# Patient Record
Sex: Male | Born: 1955 | Race: White | Hispanic: No | Marital: Married | Smoking: Former smoker
Health system: Southern US, Community
[De-identification: ages and names within clinical notes are randomized; demographics above are authoritative.]

## PROBLEM LIST (undated history)

## (undated) ENCOUNTER — Encounter

## (undated) ENCOUNTER — Ambulatory Visit

## (undated) ENCOUNTER — Ambulatory Visit: Attending: Pharmacist | Primary: Pharmacist

## (undated) DIAGNOSIS — Z9989 Dependence on other enabling machines and devices: Secondary | ICD-10-CM

## (undated) DIAGNOSIS — N529 Male erectile dysfunction, unspecified: Secondary | ICD-10-CM

## (undated) DIAGNOSIS — L719 Rosacea, unspecified: Secondary | ICD-10-CM

## (undated) DIAGNOSIS — G4733 Obstructive sleep apnea (adult) (pediatric): Secondary | ICD-10-CM

## (undated) DIAGNOSIS — I10 Essential (primary) hypertension: Secondary | ICD-10-CM

## (undated) DIAGNOSIS — C61 Malignant neoplasm of prostate: Secondary | ICD-10-CM

## (undated) DIAGNOSIS — C84A0 Cutaneous T-cell lymphoma, unspecified, unspecified site: Secondary | ICD-10-CM

## (undated) DIAGNOSIS — G4730 Sleep apnea, unspecified: Secondary | ICD-10-CM

## (undated) HISTORY — PX: URETERAL STENT PLACEMENT: SHX822

## (undated) HISTORY — PX: TONSILLECTOMY: SUR1361

## (undated) HISTORY — PX: OTHER SURGICAL HISTORY: SHX169

## (undated) HISTORY — PX: CHOLECYSTECTOMY: SHX55

## (undated) HISTORY — PX: PROSTATE SURGERY: SHX751

---

## 1988-10-08 HISTORY — PX: OTHER SURGICAL HISTORY: SHX169

## 2002-01-30 ENCOUNTER — Encounter: Payer: Self-pay | Admitting: Family Medicine

## 2002-01-30 ENCOUNTER — Encounter: Admission: RE | Admit: 2002-01-30 | Discharge: 2002-01-30 | Payer: Self-pay | Admitting: Family Medicine

## 2003-08-05 ENCOUNTER — Encounter: Admission: RE | Admit: 2003-08-05 | Discharge: 2003-08-05 | Payer: Self-pay | Admitting: Family Medicine

## 2009-07-28 ENCOUNTER — Ambulatory Visit (HOSPITAL_BASED_OUTPATIENT_CLINIC_OR_DEPARTMENT_OTHER): Admission: RE | Admit: 2009-07-28 | Discharge: 2009-07-28 | Payer: Self-pay | Admitting: Orthopedic Surgery

## 2011-01-11 LAB — POCT I-STAT, CHEM 8
BUN: 14 mg/dL (ref 6–23)
Calcium, Ion: 1.12 mmol/L (ref 1.12–1.32)
Chloride: 105 mEq/L (ref 96–112)
Creatinine, Ser: 1 mg/dL (ref 0.4–1.5)
Glucose, Bld: 110 mg/dL — ABNORMAL HIGH (ref 70–99)
HCT: 48 % (ref 39.0–52.0)
Hemoglobin: 16.3 g/dL (ref 13.0–17.0)
Potassium: 3.7 mEq/L (ref 3.5–5.1)
Sodium: 139 mEq/L (ref 135–145)
TCO2: 23 mmol/L (ref 0–100)

## 2013-11-26 ENCOUNTER — Ambulatory Visit
Admission: RE | Admit: 2013-11-26 | Discharge: 2013-11-26 | Disposition: A | Discharge status: Home / Self Care | Payer: BC Managed Care – PPO | Source: Ambulatory Visit | Attending: Internal Medicine | Admitting: Internal Medicine

## 2013-11-26 ENCOUNTER — Other Ambulatory Visit: Payer: Self-pay | Admitting: Internal Medicine

## 2013-11-26 DIAGNOSIS — R059 Cough, unspecified: Secondary | ICD-10-CM

## 2013-11-26 DIAGNOSIS — R05 Cough: Secondary | ICD-10-CM

## 2014-08-12 ENCOUNTER — Other Ambulatory Visit (HOSPITAL_COMMUNITY): Payer: Self-pay | Admitting: Dermatology

## 2014-08-13 ENCOUNTER — Other Ambulatory Visit (HOSPITAL_COMMUNITY): Payer: Self-pay | Admitting: Dermatology

## 2014-08-16 ENCOUNTER — Other Ambulatory Visit (HOSPITAL_COMMUNITY): Payer: Self-pay | Admitting: Dermatology

## 2014-08-16 DIAGNOSIS — L986 Other infiltrative disorders of the skin and subcutaneous tissue: Principal | ICD-10-CM

## 2014-08-16 DIAGNOSIS — L309 Dermatitis, unspecified: Secondary | ICD-10-CM

## 2014-08-16 DIAGNOSIS — C9110 Chronic lymphocytic leukemia of B-cell type not having achieved remission: Secondary | ICD-10-CM

## 2014-08-16 DIAGNOSIS — C8400 Mycosis fungoides, unspecified site: Secondary | ICD-10-CM

## 2014-08-18 ENCOUNTER — Ambulatory Visit (HOSPITAL_COMMUNITY)
Admission: RE | Admit: 2014-08-18 | Discharge: 2014-08-18 | Disposition: A | Discharge status: Home / Self Care | Payer: BC Managed Care – PPO | Source: Ambulatory Visit | Attending: Dermatology | Admitting: Dermatology

## 2014-08-18 DIAGNOSIS — C8400 Mycosis fungoides, unspecified site: Secondary | ICD-10-CM

## 2014-08-18 MED ORDER — IOHEXOL 300 MG/ML  SOLN
80.0000 mL | Freq: Once | INTRAMUSCULAR | Status: AC | PRN
  Administered 2014-08-18: 80 mL via INTRAVENOUS

## 2014-08-19 DIAGNOSIS — C8400 Mycosis fungoides, unspecified site: Secondary | ICD-10-CM | POA: Insufficient documentation

## 2015-06-27 DIAGNOSIS — I10 Essential (primary) hypertension: Secondary | ICD-10-CM | POA: Insufficient documentation

## 2015-10-09 DIAGNOSIS — C61 Malignant neoplasm of prostate: Secondary | ICD-10-CM

## 2015-10-09 HISTORY — PX: PROSTATE SURGERY: SHX751

## 2015-10-09 HISTORY — DX: Malignant neoplasm of prostate: C61

## 2016-01-09 DIAGNOSIS — H33302 Unspecified retinal break, left eye: Secondary | ICD-10-CM | POA: Diagnosis not present

## 2016-01-09 DIAGNOSIS — H524 Presbyopia: Secondary | ICD-10-CM | POA: Diagnosis not present

## 2016-01-19 DIAGNOSIS — N5201 Erectile dysfunction due to arterial insufficiency: Secondary | ICD-10-CM | POA: Diagnosis not present

## 2016-01-19 DIAGNOSIS — Z0000 Encounter for general adult medical examination without abnormal findings: Secondary | ICD-10-CM | POA: Diagnosis not present

## 2016-01-19 DIAGNOSIS — R972 Elevated prostate specific antigen [PSA]: Secondary | ICD-10-CM | POA: Diagnosis not present

## 2016-02-13 DIAGNOSIS — C8400 Mycosis fungoides, unspecified site: Secondary | ICD-10-CM | POA: Diagnosis not present

## 2016-02-13 DIAGNOSIS — Z79899 Other long term (current) drug therapy: Secondary | ICD-10-CM | POA: Diagnosis not present

## 2016-03-06 DIAGNOSIS — D075 Carcinoma in situ of prostate: Secondary | ICD-10-CM | POA: Diagnosis not present

## 2016-03-06 DIAGNOSIS — R972 Elevated prostate specific antigen [PSA]: Secondary | ICD-10-CM | POA: Diagnosis not present

## 2016-03-06 DIAGNOSIS — Z0000 Encounter for general adult medical examination without abnormal findings: Secondary | ICD-10-CM | POA: Diagnosis not present

## 2016-03-19 ENCOUNTER — Other Ambulatory Visit: Payer: Self-pay | Admitting: Urology

## 2016-03-19 DIAGNOSIS — C61 Malignant neoplasm of prostate: Secondary | ICD-10-CM

## 2016-03-23 ENCOUNTER — Ambulatory Visit (HOSPITAL_COMMUNITY)
Admission: RE | Admit: 2016-03-23 | Discharge: 2016-03-23 | Disposition: A | Discharge status: Home / Self Care | Payer: BLUE CROSS/BLUE SHIELD | Source: Ambulatory Visit | Attending: Urology | Admitting: Urology

## 2016-03-23 ENCOUNTER — Encounter (HOSPITAL_COMMUNITY)
Admission: RE | Admit: 2016-03-23 | Discharge: 2016-03-23 | Disposition: A | Discharge status: Home / Self Care | Payer: BLUE CROSS/BLUE SHIELD | Source: Ambulatory Visit | Attending: Urology | Admitting: Urology

## 2016-03-23 DIAGNOSIS — C61 Malignant neoplasm of prostate: Secondary | ICD-10-CM | POA: Diagnosis not present

## 2016-03-23 DIAGNOSIS — R972 Elevated prostate specific antigen [PSA]: Secondary | ICD-10-CM | POA: Diagnosis not present

## 2016-03-23 MED ORDER — TECHNETIUM TC 99M MEDRONATE IV KIT
24.1000 | PACK | Freq: Once | INTRAVENOUS | Status: AC | PRN
  Administered 2016-03-23: 24.1 via INTRAVENOUS

## 2016-03-29 DIAGNOSIS — C61 Malignant neoplasm of prostate: Secondary | ICD-10-CM | POA: Diagnosis not present

## 2016-03-29 DIAGNOSIS — N5201 Erectile dysfunction due to arterial insufficiency: Secondary | ICD-10-CM | POA: Diagnosis not present

## 2016-04-04 DIAGNOSIS — Z79899 Other long term (current) drug therapy: Secondary | ICD-10-CM | POA: Diagnosis not present

## 2016-04-04 DIAGNOSIS — C8400 Mycosis fungoides, unspecified site: Secondary | ICD-10-CM | POA: Diagnosis not present

## 2016-04-16 DIAGNOSIS — C8400 Mycosis fungoides, unspecified site: Secondary | ICD-10-CM | POA: Diagnosis not present

## 2016-04-16 DIAGNOSIS — L652 Alopecia mucinosa: Secondary | ICD-10-CM | POA: Diagnosis not present

## 2016-04-16 DIAGNOSIS — Z79899 Other long term (current) drug therapy: Secondary | ICD-10-CM | POA: Diagnosis not present

## 2016-04-18 ENCOUNTER — Encounter: Payer: Self-pay | Admitting: Radiation Oncology

## 2016-04-18 NOTE — Progress Notes (Signed)
GU Location of Tumor / Histology: prostatic adenocarcinoma  If Prostate Cancer, Gleason Score is (4 + 5) and PSA is (4.59) on 3/17  Kevin [name] was referred by his PCP, Dr. Inda Merlin, to Dr. Phebe Colla for evaluation of an elevated PSA in May 2017  Biopsies of prostate (if applicable) revealed:    Past/Anticipated interventions by urology, if any: biopsy, referral to Dr. Tammi Klippel for second opinion, encouraging surgery or radiation followed by hormone therapy  Past/Anticipated interventions by medical oncology, if any: no  Weight changes, if any: no  Bowel/Bladder complaints, if [id number] hematuria, dysuria, leakage or incontinence. Reports frequency, urgency, and weak stream less than 1 in 5 times. Mild ED.   Nausea/Vomiting, if any: no  Pain issues, if any:  no  SAFETY ISSUES:  Prior radiation? 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  Pacemaker/ICD? no  Possible current pregnancy? no  Is the patient on methotrexate? no  Current Complaints / other details:  60 year old male. Married. Paternal uncle had prostate cancer. Father had oral cancer. Mother had skin cancer.

## 2016-04-19 ENCOUNTER — Encounter: Payer: Self-pay | Admitting: Radiation Oncology

## 2016-04-19 ENCOUNTER — Ambulatory Visit
Admission: RE | Admit: 2016-04-19 | Discharge: 2016-04-19 | Disposition: A | Discharge status: Home / Self Care | Payer: BLUE CROSS/BLUE SHIELD | Source: Ambulatory Visit | Attending: Radiation Oncology | Admitting: Radiation Oncology

## 2016-04-19 ENCOUNTER — Ambulatory Visit: Payer: BLUE CROSS/BLUE SHIELD | Admitting: Radiation Oncology

## 2016-04-19 ENCOUNTER — Encounter: Payer: Self-pay | Admitting: Medical Oncology

## 2016-04-19 VITALS — BP 141/98 | HR 66 | Ht 72.0 in | Wt 207.3 lb

## 2016-04-19 DIAGNOSIS — C61 Malignant neoplasm of prostate: Secondary | ICD-10-CM | POA: Insufficient documentation

## 2016-04-19 HISTORY — DX: Cutaneous T-cell lymphoma, unspecified, unspecified site: C84.A0

## 2016-04-19 HISTORY — DX: Malignant neoplasm of prostate: C61

## 2016-04-19 HISTORY — DX: Essential (primary) hypertension: I10

## 2016-04-19 NOTE — Progress Notes (Signed)
Radiation Oncology         [phone]) [phone] ________________________________  Initial outpatient Consultation  Name: Kevin [name] MRN: [id number]  Date: 04/19/2016  DOB: October 02, 1956  [id number] NEVILL, MD  Josetta Huddle, MD   REFERRING PHYSICIAN: Josetta Huddle, MD  DIAGNOSIS: The encounter diagnosis was Malignant neoplasm of prostate [facility]).    ICD-9-CM ICD-10-CM   1. Malignant neoplasm of prostate ([place]) 185 C61     HISTORY OF PRESENT ILLNESS: Kevin [name] is a pleasant 60 y.o. male with a newly noted prostate cancer seen at the request of Dr. Tresa Moore. The patient has been followed by his PCP Dr. Inda Merlin and his PSA rose from 3.6 in 2015 to 3.94 in 2016. In May 2017 PSA was 4.59. He subsequently was referred to Dr. Tresa Moore and on 03/06/16 he was seen, at that time DRE revealed no palpable nodules. A biopsy was then performed on 04/06/16 and his prostatic volume by ultrasound ws 32 cc. Final pathology revealed a Gleason 4+5 adenocarcinoma and several Gleason 6 and 7 cancer within the specimen with 6/12 cores being involved with cancer. He is also being cared for by Dr. Lyanne Co at [facility] for a cutaneous T cell lymphoma which was diagnosed in November 2015 and is on chronic interferon therapy. He has been counseled on his disease and the need for multimodality therapy. He comes to discuss the role of radiotherapy with Dr. Tammi Klippel.     PREVIOUS RADIATION THERAPY: No  PAST MEDICAL HISTORY:  Past Medical History  Diagnosis Date  . Prostate cancer ([place])   . 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 Hypertension       PAST SURGICAL HISTORY: Past Surgical History  Procedure Laterality Date  . Cholecystectomy    . Tonsillectomy    . Receiving chronic interferon       FAMILY HISTORY:  Family History  Problem Relation Age of Onset  . Hypertension Mother   . Skin cancer Mother   . Stroke Father   . Hypertension Father   . Head & neck cancer Father     . Prostate cancer Paternal Uncle     prostate ca   SOCIAL HISTORY:  Social History   Social History  . Marital Status: Married    Spouse Name: N/A  . Number of Children: N/A  . Years of Education: N/A   Occupational History  . Not on file.   Social History Main Topics  . Smoking status: Former Smoker -- 10 years    Types: Cigarettes    Quit date: 10/08/2014  . Smokeless tobacco: Never Used  . Alcohol Use: Yes     Comment: 2-5 per day  . Drug Use: No  . Sexual Activity: Yes   Other Topics Concern  . Not on file   Social History Narrative   Patient is a Customer service manager.  Both he and wife live in the [place] area. Two children.  One in [place], one in [place], [place].   ALLERGIES: Review of patient's allergies indicates no known allergies.  MEDICATIONS:  Current Outpatient Prescriptions  Medication Sig Dispense Refill  . interferon alfa-2b 20 Million Units/m2 in sodium chloride 0.9 % 100 mL     . milk thistle 175 MG tablet Take 175 mg by mouth daily.    . Red Yeast Rice Extract 600 MG CAPS Take by mouth.    . triamcinolone cream (KENALOG) 0.1 % Apply topically.     No  current facility-administered medications for this encounter.    REVIEW OF SYSTEMS:  On review of systems, the patient reports that he is doing well overall. He denies any chest pain, shortness of breath, cough, fevers, chills, night sweats, unintended weight changes. He denies any bowel disturbances, and denies abdominal pain, nausea or vomiting. He admits to frequency, urinary urgency, and a weak stream.  His IPSS score is 3, indicating mild urinary symptoms.  His erectile function is preserved and he is able to complete sexual activity on most attempts. He denies any new musculoskeletal or joint aches or pains. A complete review of systems is obtained and is otherwise negative. He uses Viagra and Cialis.    PHYSICAL EXAM:  height is 6' (1.829 m) and weight is 207 lb 4.8 oz (94.031 kg). His blood  pressure is 141/98 and his pulse is 66.   Pain Scale 0/10 In general this is a well appearing he in no acute distress. He is alert and oriented x4 and appropriate throughout the examination. HEENT reveals that the patient is normocephalic, atraumatic. EOMs are intact. PERRLA. Skin is intact without any evidence of gross lesions. Cardiovascular exam reveals a regular rate and rhythm, no clicks rubs or murmurs are auscultated. Chest is clear to auscultation bilaterally. Lymphatic assessment is performed and does not reveal any adenopathy in the cervical, supraclavicular, axillary, or inguinal chains. Abdomen has active bowel sounds in all quadrants and is intact. The abdomen is soft, non tender, non distended. Lower extremities are negative for pretibial pitting edema, deep calf tenderness, cyanosis or clubbing.    KPS = 100  100 - Normal; no complaints; no evidence of disease. 90   - Able to carry on normal activity; minor signs or symptoms of disease. 80   - Normal activity with effort; some signs or symptoms of disease. 47   - Cares for self; unable to carry on normal activity or to do active work. 60   - Requires occasional assistance, but is able to care for most of his personal needs. 50   - Requires considerable assistance and frequent medical care. 30   - Disabled; requires special care and assistance. 51   - Severely disabled; hospital admission is indicated although death not imminent. 61   - Very sick; hospital admission necessary; active supportive treatment necessary. 10   - Moribund; fatal processes progressing rapidly. 0     - Dead  Karnofsky DA, Abelmann [place], Craver LS and Burchenal [facility] [phone]) The use of the nitrogen mustards in the palliative treatment of carcinoma: with particular reference to bronchogenic carcinoma Cancer 1 634-56  LABORATORY DATA:  Lab Results  Component Value Date   HGB 16.3 07/28/2009   HCT 48.0 07/28/2009   Lab Results  Component Value Date   NA 139  07/28/2009   K 3.7 07/28/2009   CL 105 07/28/2009   No results found for: ALT, AST, GGT, ALKPHOS, BILITOT   RADIOGRAPHY: Nm Bone Scan Whole Body  03/23/2016  CLINICAL DATA:  History of prostate cancer. No bone pain. No history of recent or old fracture. Also history of T-cell lymphoma 2015. Elevated PSA. EXAM: NUCLEAR MEDICINE WHOLE BODY BONE SCAN TECHNIQUE: Whole body anterior and posterior images were obtained approximately 3 hours after intravenous injection of radiopharmaceutical. RADIOPHARMACEUTICALS:  24.1 mCi Technetium-60m MDP IV COMPARISON:  CT 08/18/2014 and 03/23/2016 FINDINGS: Examination demonstrates normal biodistribution of radiotracer uptake within the bones, soft tissues and genitourinary system. No focal radiotracer uptake to suggest osseous metastatic disease.  IMPRESSION: Normal bone scan. Electronically Signed   By: Marin Olp M.D.   On: 03/23/2016 14:24      IMPRESSION:  60 y.o. gentleman with a high risk, T1c adenocarcinoma of the prostate with a Gleason's score of 4+5 and PSA of 4.59.   PLAN: Dr. Tammi Klippel discusses the findings from the patient's biopsy and imaging studies. He discusses the patient's Gleason's Score puts his cancer into a high risk category and because of this he should receive treatment for his cancer. We discussed the options would include surgical intervention versus radiotherapy. He is going to be seeking outside second opinion with urology to contrast the surgical therapy he has been offered which includes radical prostatectomy with bilateral pelvic lymph node dissection. Dr. Tammi Klippel discusses the options of radiotherapy with the addition of 2 years of ADT. He outlines that the two options for radiotherapy would be for 5 weeks of external radiation followed by a radioactive seed implant as a boost. Alternatively, it would be reasonable to consider 8 weeks of external radiotherapy. He recommends with either approach, we would recommend ADT administration 2  months prior to initiating radiotherapy. The patient would like to consider his options and meet with the urologists at [facility]. He is going to be taking a trip later this October and would like to coordinate whichever treatment approach he chooses around this.     The above documentation reflects my direct findings during this shared patient visit. Please see the separate note by Dr. Tammi Klippel on this date for the remainder of the patient's plan of care.   Carola Rhine, PAC   This document serves as a record of services personally performed by Tyler Pita, MD. It was created on his behalf by Truddie Hidden, a trained medical scribe. The creation of this record is based on the scribe's personal observations and the provider's statements to them. This document has been checked and approved by the attending provider.

## 2016-04-19 NOTE — Progress Notes (Signed)
See progress note under physician encounter. 

## 2016-05-11 DIAGNOSIS — C61 Malignant neoplasm of prostate: Secondary | ICD-10-CM | POA: Diagnosis not present

## 2016-05-11 DIAGNOSIS — Z7189 Other specified counseling: Secondary | ICD-10-CM | POA: Diagnosis not present

## 2016-05-23 ENCOUNTER — Telehealth: Payer: Self-pay | Admitting: Radiation Oncology

## 2016-05-23 NOTE — Telephone Encounter (Addendum)
LM for pt to call me back about treatment options.

## 2016-05-31 DIAGNOSIS — C61 Malignant neoplasm of prostate: Secondary | ICD-10-CM | POA: Diagnosis not present

## 2016-06-26 ENCOUNTER — Telehealth: Payer: Self-pay | Admitting: Radiation Oncology

## 2016-06-26 NOTE — Telephone Encounter (Signed)
I spoke with the patient and he is having surgery in October at [facility] for his prostate cancer. I wished him luck and encouraged him to keep [place] informed if he had any questions in the future regarding radiation treatment.

## 2016-07-12 DIAGNOSIS — I10 Essential (primary) hypertension: Secondary | ICD-10-CM | POA: Diagnosis not present

## 2016-07-12 DIAGNOSIS — N5203 Combined arterial insufficiency and corporo-venous occlusive erectile dysfunction: Secondary | ICD-10-CM | POA: Diagnosis not present

## 2016-07-12 DIAGNOSIS — L652 Alopecia mucinosa: Secondary | ICD-10-CM | POA: Diagnosis not present

## 2016-07-12 DIAGNOSIS — C61 Malignant neoplasm of prostate: Secondary | ICD-10-CM | POA: Diagnosis not present

## 2016-07-18 DIAGNOSIS — C8409 Mycosis fungoides, extranodal and solid organ sites: Secondary | ICD-10-CM | POA: Diagnosis not present

## 2016-07-18 DIAGNOSIS — C61 Malignant neoplasm of prostate: Secondary | ICD-10-CM | POA: Diagnosis not present

## 2016-07-18 DIAGNOSIS — G8918 Other acute postprocedural pain: Secondary | ICD-10-CM | POA: Diagnosis not present

## 2016-07-18 DIAGNOSIS — N5202 Corporo-venous occlusive erectile dysfunction: Secondary | ICD-10-CM | POA: Diagnosis not present

## 2016-07-18 DIAGNOSIS — I10 Essential (primary) hypertension: Secondary | ICD-10-CM | POA: Diagnosis not present

## 2016-08-02 DIAGNOSIS — N393 Stress incontinence (female) (male): Secondary | ICD-10-CM | POA: Diagnosis not present

## 2016-08-02 DIAGNOSIS — N5231 Erectile dysfunction following radical prostatectomy: Secondary | ICD-10-CM | POA: Diagnosis not present

## 2016-08-02 DIAGNOSIS — C61 Malignant neoplasm of prostate: Secondary | ICD-10-CM | POA: Diagnosis not present

## 2016-09-10 DIAGNOSIS — C8400 Mycosis fungoides, unspecified site: Secondary | ICD-10-CM | POA: Diagnosis not present

## 2016-09-10 DIAGNOSIS — L652 Alopecia mucinosa: Secondary | ICD-10-CM | POA: Diagnosis not present

## 2016-09-10 DIAGNOSIS — Z79899 Other long term (current) drug therapy: Secondary | ICD-10-CM | POA: Diagnosis not present

## 2016-11-01 DIAGNOSIS — I10 Essential (primary) hypertension: Secondary | ICD-10-CM | POA: Diagnosis not present

## 2016-11-01 DIAGNOSIS — Z79899 Other long term (current) drug therapy: Secondary | ICD-10-CM | POA: Diagnosis not present

## 2016-11-01 DIAGNOSIS — C8400 Mycosis fungoides, unspecified site: Secondary | ICD-10-CM | POA: Diagnosis not present

## 2016-11-01 DIAGNOSIS — N5203 Combined arterial insufficiency and corporo-venous occlusive erectile dysfunction: Secondary | ICD-10-CM | POA: Diagnosis not present

## 2016-11-01 DIAGNOSIS — C61 Malignant neoplasm of prostate: Secondary | ICD-10-CM | POA: Diagnosis not present

## 2016-12-10 DIAGNOSIS — C8400 Mycosis fungoides, unspecified site: Secondary | ICD-10-CM | POA: Diagnosis not present

## 2016-12-10 DIAGNOSIS — L652 Alopecia mucinosa: Secondary | ICD-10-CM | POA: Diagnosis not present

## 2016-12-10 DIAGNOSIS — Z79899 Other long term (current) drug therapy: Secondary | ICD-10-CM | POA: Diagnosis not present

## 2017-01-11 DIAGNOSIS — Z0001 Encounter for general adult medical examination with abnormal findings: Secondary | ICD-10-CM | POA: Diagnosis not present

## 2017-01-16 DIAGNOSIS — R109 Unspecified abdominal pain: Secondary | ICD-10-CM | POA: Diagnosis not present

## 2017-01-16 DIAGNOSIS — Z8546 Personal history of malignant neoplasm of prostate: Secondary | ICD-10-CM | POA: Insufficient documentation

## 2017-01-16 DIAGNOSIS — C84A0 Cutaneous T-cell lymphoma, unspecified, unspecified site: Secondary | ICD-10-CM | POA: Insufficient documentation

## 2017-01-16 DIAGNOSIS — N23 Unspecified renal colic: Secondary | ICD-10-CM | POA: Diagnosis not present

## 2017-01-16 DIAGNOSIS — Z87891 Personal history of nicotine dependence: Secondary | ICD-10-CM | POA: Diagnosis not present

## 2017-01-16 DIAGNOSIS — N136 Pyonephrosis: Secondary | ICD-10-CM | POA: Diagnosis not present

## 2017-01-16 DIAGNOSIS — N132 Hydronephrosis with renal and ureteral calculous obstruction: Secondary | ICD-10-CM | POA: Diagnosis not present

## 2017-01-16 DIAGNOSIS — I10 Essential (primary) hypertension: Secondary | ICD-10-CM | POA: Insufficient documentation

## 2017-01-16 DIAGNOSIS — Z79899 Other long term (current) drug therapy: Secondary | ICD-10-CM | POA: Diagnosis not present

## 2017-01-17 ENCOUNTER — Observation Stay (HOSPITAL_BASED_OUTPATIENT_CLINIC_OR_DEPARTMENT_OTHER): Payer: BLUE CROSS/BLUE SHIELD | Admitting: Anesthesiology

## 2017-01-17 ENCOUNTER — Emergency Department (HOSPITAL_COMMUNITY): Payer: BLUE CROSS/BLUE SHIELD

## 2017-01-17 ENCOUNTER — Encounter (HOSPITAL_COMMUNITY): Payer: Self-pay | Admitting: Emergency Medicine

## 2017-01-17 ENCOUNTER — Observation Stay (HOSPITAL_COMMUNITY)
Admission: EM | Admit: 2017-01-17 | Discharge: 2017-01-17 | Disposition: A | Discharge status: Home / Self Care | Payer: BLUE CROSS/BLUE SHIELD | Attending: Urology | Admitting: Urology

## 2017-01-17 ENCOUNTER — Encounter (HOSPITAL_COMMUNITY)
Admission: EM | Disposition: A | Discharge status: Home / Self Care | Payer: Self-pay | Source: Home / Self Care | Attending: Emergency Medicine

## 2017-01-17 DIAGNOSIS — R109 Unspecified abdominal pain: Secondary | ICD-10-CM | POA: Diagnosis not present

## 2017-01-17 DIAGNOSIS — N201 Calculus of ureter: Secondary | ICD-10-CM | POA: Diagnosis not present

## 2017-01-17 DIAGNOSIS — N23 Unspecified renal colic: Secondary | ICD-10-CM

## 2017-01-17 DIAGNOSIS — I10 Essential (primary) hypertension: Secondary | ICD-10-CM | POA: Diagnosis not present

## 2017-01-17 DIAGNOSIS — N132 Hydronephrosis with renal and ureteral calculous obstruction: Secondary | ICD-10-CM

## 2017-01-17 DIAGNOSIS — Z466 Encounter for fitting and adjustment of urinary device: Secondary | ICD-10-CM | POA: Diagnosis not present

## 2017-01-17 DIAGNOSIS — R52 Pain, unspecified: Secondary | ICD-10-CM

## 2017-01-17 HISTORY — PX: CYSTOSCOPY WITH RETROGRADE PYELOGRAM, URETEROSCOPY AND STENT PLACEMENT: SHX5789

## 2017-01-17 LAB — URINALYSIS, ROUTINE W REFLEX MICROSCOPIC
Bilirubin Urine: NEGATIVE
Glucose, UA: NEGATIVE mg/dL
Ketones, ur: NEGATIVE mg/dL
Nitrite: NEGATIVE
Protein, ur: NEGATIVE mg/dL
Specific Gravity, Urine: 1.017 (ref 1.005–1.030)
Squamous Epithelial / LPF: NONE SEEN
pH: 6 (ref 5.0–8.0)

## 2017-01-17 LAB — CBC WITH DIFFERENTIAL/PLATELET
Basophils Absolute: 0 10*3/uL (ref 0.0–0.1)
Basophils Relative: 0 %
Eosinophils Absolute: 0.1 10*3/uL (ref 0.0–0.7)
Eosinophils Relative: 2 %
HCT: 41.4 % (ref 39.0–52.0)
Hemoglobin: 14.9 g/dL (ref 13.0–17.0)
Lymphocytes Relative: 21 %
Lymphs Abs: 1.2 10*3/uL (ref 0.7–4.0)
MCH: 31.8 pg (ref 26.0–34.0)
MCHC: 36 g/dL (ref 30.0–36.0)
MCV: 88.5 fL (ref 78.0–100.0)
Monocytes Absolute: 0.6 10*3/uL (ref 0.1–1.0)
Monocytes Relative: 9 %
Neutro Abs: 4 10*3/uL (ref 1.7–7.7)
Neutrophils Relative %: 68 %
Platelets: 179 10*3/uL (ref 150–400)
RBC: 4.68 MIL/uL (ref 4.22–5.81)
RDW: 13.4 % (ref 11.5–15.5)
WBC: 5.9 10*3/uL (ref 4.0–10.5)

## 2017-01-17 LAB — BASIC METABOLIC PANEL
Anion gap: 10 (ref 5–15)
BUN: 20 mg/dL (ref 6–20)
CO2: 21 mmol/L — ABNORMAL LOW (ref 22–32)
Calcium: 9.9 mg/dL (ref 8.9–10.3)
Chloride: 108 mmol/L (ref 101–111)
Creatinine, Ser: 1.39 mg/dL — ABNORMAL HIGH (ref 0.61–1.24)
GFR calc Af Amer: >60 mL/min (ref >60)
GFR calc non Af Amer: 54 mL/min — ABNORMAL LOW (ref >60)
Glucose, Bld: 176 mg/dL — ABNORMAL HIGH (ref 65–99)
Potassium: 3.8 mmol/L (ref 3.5–5.1)
Sodium: 139 mmol/L (ref 135–145)

## 2017-01-17 SURGERY — CYSTOURETEROSCOPY, WITH RETROGRADE PYELOGRAM AND STENT INSERTION
Anesthesia: General | Laterality: Left

## 2017-01-17 MED ORDER — KETOROLAC TROMETHAMINE 30 MG/ML IJ SOLN: 30.0000 mg | Freq: Once | INTRAMUSCULAR | Status: DC

## 2017-01-17 MED ORDER — LIDOCAINE 2% (20 MG/ML) 5 ML SYRINGE
INTRAMUSCULAR | Status: DC | PRN
  Administered 2017-01-17: 100 mg via INTRAVENOUS

## 2017-01-17 MED ORDER — LACTATED RINGERS IV SOLN
INTRAVENOUS | Status: DC
  Administered 2017-01-17: 14:00:00 via INTRAVENOUS

## 2017-01-17 MED ORDER — METOCLOPRAMIDE HCL 5 MG/ML IJ SOLN: 10.0000 mg | Freq: Once | INTRAMUSCULAR | Status: DC | PRN

## 2017-01-17 MED ORDER — HYDROMORPHONE 1 MG/ML IV SOLN
INTRAVENOUS | Status: DC
  Administered 2017-01-17: 12:00:00 via INTRAVENOUS
  Filled 2017-01-17: qty 25

## 2017-01-17 MED ORDER — OXYBUTYNIN CHLORIDE 5 MG PO TABS
5.0000 mg | ORAL_TABLET | Freq: Three times a day (TID) | ORAL | Status: DC
  Administered 2017-01-17: 5 mg via ORAL

## 2017-01-17 MED ORDER — FENTANYL CITRATE (PF) 100 MCG/2ML IJ SOLN
100.0000 ug | Freq: Once | INTRAMUSCULAR | Status: AC
  Administered 2017-01-17: 100 ug via INTRAVENOUS

## 2017-01-17 MED ORDER — OXYBUTYNIN CHLORIDE 5 MG PO TABS: 5.0000 mg | ORAL_TABLET | Freq: Three times a day (TID) | ORAL | 1 refills | Status: DC | PRN

## 2017-01-17 MED ORDER — HYDROCODONE-ACETAMINOPHEN 5-325 MG PO TABS: 1.0000 | ORAL_TABLET | ORAL | 0 refills | Status: DC | PRN

## 2017-01-17 MED ORDER — KETOROLAC TROMETHAMINE 60 MG/2ML IM SOLN
60.0000 mg | Freq: Once | INTRAMUSCULAR | Status: AC
  Administered 2017-01-17: 60 mg via INTRAMUSCULAR
  Filled 2017-01-17: qty 2

## 2017-01-17 MED ORDER — ONDANSETRON HCL 4 MG/2ML IJ SOLN
INTRAMUSCULAR | Status: DC | PRN
  Administered 2017-01-17: 4 mg via INTRAVENOUS

## 2017-01-17 MED ORDER — CEPHALEXIN 500 MG PO CAPS: 500.0000 mg | ORAL_CAPSULE | Freq: Two times a day (BID) | ORAL | 0 refills | Status: DC

## 2017-01-17 MED ORDER — MEPERIDINE HCL 25 MG/ML IJ SOLN: 6.2500 mg | INTRAMUSCULAR | Status: DC | PRN

## 2017-01-17 MED ORDER — ONDANSETRON HCL 4 MG/2ML IJ SOLN: 4.0000 mg | Freq: Four times a day (QID) | INTRAMUSCULAR | Status: DC | PRN

## 2017-01-17 MED ORDER — HYDROMORPHONE HCL 2 MG/ML IJ SOLN
1.0000 mg | INTRAMUSCULAR | Status: AC | PRN
  Administered 2017-01-17 (×2): 1 mg via INTRAVENOUS
  Filled 2017-01-17 (×2): qty 1

## 2017-01-17 MED ORDER — DIPHENHYDRAMINE HCL 12.5 MG/5ML PO ELIX: 12.5000 mg | ORAL_SOLUTION | Freq: Four times a day (QID) | ORAL | Status: DC | PRN

## 2017-01-17 MED ORDER — OXYCODONE-ACETAMINOPHEN 5-325 MG PO TABS
2.0000 | ORAL_TABLET | Freq: Once | ORAL | Status: AC
  Administered 2017-01-17: 2 via ORAL
  Filled 2017-01-17: qty 2

## 2017-01-17 MED ORDER — KETOROLAC TROMETHAMINE 30 MG/ML IJ SOLN
30.0000 mg | Freq: Four times a day (QID) | INTRAMUSCULAR | Status: DC
Start: 2017-01-17 — End: 2017-01-17
  Administered 2017-01-17: 30 mg via INTRAVENOUS
  Filled 2017-01-17: qty 1

## 2017-01-17 MED ORDER — AMLODIPINE BESYLATE 5 MG PO TABS: 5.0000 mg | ORAL_TABLET | Freq: Every day | ORAL | Status: DC

## 2017-01-17 MED ORDER — IRBESARTAN 150 MG PO TABS: 150.0000 mg | ORAL_TABLET | Freq: Every day | ORAL | Status: DC

## 2017-01-17 MED ORDER — NALOXONE HCL 0.4 MG/ML IJ SOLN: 0.4000 mg | INTRAMUSCULAR | Status: DC | PRN

## 2017-01-17 MED ORDER — DEXAMETHASONE SODIUM PHOSPHATE 10 MG/ML IJ SOLN
INTRAMUSCULAR | Status: DC | PRN
Start: 2017-01-17 — End: 2017-01-17
  Administered 2017-01-17: 10 mg via INTRAVENOUS

## 2017-01-17 MED ORDER — HYDROMORPHONE HCL 2 MG/ML IJ SOLN
1.0000 mg | Freq: Once | INTRAMUSCULAR | Status: AC
  Administered 2017-01-17: 1 mg via INTRAVENOUS
  Filled 2017-01-17: qty 1

## 2017-01-17 MED ORDER — DIPHENHYDRAMINE HCL 50 MG/ML IJ SOLN
12.5000 mg | Freq: Four times a day (QID) | INTRAMUSCULAR | Status: DC | PRN
Start: 2017-01-17 — End: 2017-01-17

## 2017-01-17 MED ORDER — CEFAZOLIN SODIUM-DEXTROSE 2-4 GM/100ML-% IV SOLN
2.0000 g | INTRAVENOUS | Status: AC
  Administered 2017-01-17: 2 g via INTRAVENOUS

## 2017-01-17 MED ORDER — DEXTROSE IN LACTATED RINGERS 5 % IV SOLN
INTRAVENOUS | Status: DC
  Administered 2017-01-17: 10:00:00 via INTRAVENOUS

## 2017-01-17 MED ORDER — FENTANYL CITRATE (PF) 100 MCG/2ML IJ SOLN
INTRAMUSCULAR | Status: DC | PRN
  Administered 2017-01-17 (×2): 50 ug via INTRAVENOUS

## 2017-01-17 MED ORDER — FENTANYL CITRATE (PF) 100 MCG/2ML IJ SOLN
50.0000 ug | INTRAMUSCULAR | Status: DC | PRN
  Administered 2017-01-17: 50 ug via NASAL
  Filled 2017-01-17: qty 2

## 2017-01-17 MED ORDER — ONDANSETRON HCL 4 MG/2ML IJ SOLN
4.0000 mg | Freq: Once | INTRAMUSCULAR | Status: AC
  Administered 2017-01-17: 4 mg via INTRAVENOUS
  Filled 2017-01-17: qty 2

## 2017-01-17 MED ORDER — SODIUM CHLORIDE 0.9% FLUSH: 9.0000 mL | INTRAVENOUS | Status: DC | PRN

## 2017-01-17 MED ORDER — SODIUM CHLORIDE 0.9 % IV BOLUS (SEPSIS)
1000.0000 mL | Freq: Once | INTRAVENOUS | Status: AC
  Administered 2017-01-17: 1000 mL via INTRAVENOUS

## 2017-01-17 MED ORDER — KETOROLAC TROMETHAMINE 30 MG/ML IJ SOLN
INTRAMUSCULAR | Status: DC | PRN
  Administered 2017-01-17: 30 mg via INTRAVENOUS

## 2017-01-17 MED ORDER — CEFAZOLIN (ANCEF) 1 G IV SOLR: 2.0000 g | INTRAVENOUS | Status: DC

## 2017-01-17 MED ORDER — FENTANYL CITRATE (PF) 100 MCG/2ML IJ SOLN: 25.0000 ug | INTRAMUSCULAR | Status: DC | PRN

## 2017-01-17 MED ORDER — IOPAMIDOL (ISOVUE-300) INJECTION 61%
INTRAVENOUS | Status: DC | PRN
  Administered 2017-01-17: 8 mL via INTRAVENOUS

## 2017-01-17 MED ORDER — LACTATED RINGERS IV SOLN: INTRAVENOUS | Status: DC

## 2017-01-17 MED ORDER — PROPOFOL 10 MG/ML IV BOLUS
INTRAVENOUS | Status: DC | PRN
  Administered 2017-01-17: 200 mg via INTRAVENOUS

## 2017-01-17 MED ORDER — AMLODIPINE BESYLATE-VALSARTAN 5-160 MG PO TABS: 1.0000 | ORAL_TABLET | Freq: Every day | ORAL | Status: DC

## 2017-01-17 MED ORDER — ACITRETIN 25 MG PO CAPS: 25.0000 mg | ORAL_CAPSULE | Freq: Every day | ORAL | Status: DC

## 2017-01-17 MED ORDER — MIDAZOLAM HCL 5 MG/5ML IJ SOLN
INTRAMUSCULAR | Status: DC | PRN
  Administered 2017-01-17: 2 mg via INTRAVENOUS

## 2017-01-17 MED ORDER — ONDANSETRON HCL 4 MG/2ML IJ SOLN: 4.0000 mg | INTRAMUSCULAR | Status: DC | PRN

## 2017-01-17 SURGICAL SUPPLY — 22 items
BAG URO CATCHER STRL LF (MISCELLANEOUS) ×2 IMPLANT
BASKET LASER NITINOL 1.9FR (BASKET) ×2 IMPLANT
BASKET ZERO TIP NITINOL 2.4FR (BASKET) IMPLANT
CATH INTERMIT  6FR 70CM (CATHETERS) IMPLANT
CLOTH BEACON ORANGE TIMEOUT ST (SAFETY) ×2 IMPLANT
COVER SURGICAL LIGHT HANDLE (MISCELLANEOUS) ×2 IMPLANT
EXTRACTOR STONE NITINOL NGAGE (UROLOGICAL SUPPLIES) ×2 IMPLANT
FIBER LASER FLEXIVA 365 (UROLOGICAL SUPPLIES) ×2 IMPLANT
FIBER LASER TRAC TIP (UROLOGICAL SUPPLIES) IMPLANT
GLOVE BIOGEL M 8.0 STRL (GLOVE) ×2 IMPLANT
GOWN STRL REUS W/ TWL XL LVL3 (GOWN DISPOSABLE) ×1 IMPLANT
GOWN STRL REUS W/TWL LRG LVL3 (GOWN DISPOSABLE) ×4 IMPLANT
GOWN STRL REUS W/TWL XL LVL3 (GOWN DISPOSABLE) ×1
GUIDEWIRE ANG ZIPWIRE 038X150 (WIRE) ×2 IMPLANT
GUIDEWIRE STR DUAL SENSOR (WIRE) ×2 IMPLANT
IV NS 1000ML (IV SOLUTION) ×1
IV NS 1000ML BAXH (IV SOLUTION) ×1 IMPLANT
MANIFOLD NEPTUNE II (INSTRUMENTS) ×2 IMPLANT
PACK CYSTO (CUSTOM PROCEDURE TRAY) ×2 IMPLANT
SHEATH ACCESS URETERAL 38CM (SHEATH) ×2 IMPLANT
STENT URET 6FRX26 CONTOUR (STENTS) ×2 IMPLANT
TUBING CONNECTING 10 (TUBING) ×2 IMPLANT

## 2017-01-17 NOTE — ED Notes (Signed)
Urologist at bedside.

## 2017-01-17 NOTE — H&P (Signed)
Urology History and Physical Exam  CC: Kidney stone  HPI: 61 year old male is admitted for pain management as well as intervention for a significantly symptomatic left ureteral stone.  He has no prior history of urolithiasis.  He came to the emergency room approximately 8 hours ago with sudden onset of left flank pain.  He was diagnosed with a 6 millimeter left proximal ureteral stone.  He has been unable to be made appropriately comfortable despite aggressive attention to pain management.  He has had no fever or chills.  There is no history of recent infection.  His prior urologic history significant for prostate cancer, having underwent a radical prostatectomy at [facility] in October 2017.  He is also treated for hypertension and cutaneous T-cell lymphoma.  PMH: Past Medical History:  Diagnosis Date  . Hypertension   . 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 Prostate cancer ([place])     PSH: Past Surgical History:  Procedure Laterality Date  . CHOLECYSTECTOMY    . PROSTATE SURGERY    . receiving chronic interferon     . TONSILLECTOMY      Allergies: No Known Allergies  Medications:  (Not in a hospital admission)   Social History: Social History   Social History  . Marital status: Married    Spouse name: N/A  . Number of children: N/A  . Years of education: N/A   Occupational History  . Not on file.   Social History Main Topics  . Smoking status: Former Smoker    Years: 10.00    Types: Cigarettes    Quit date: 10/08/2014  . Smokeless tobacco: Never Used  . Alcohol use Yes     Comment: 2-5 per day  . Drug use: No  . Sexual activity: Yes   Other Topics Concern  . Not on file   Social History Narrative  . No narrative on file    Family History: Family History  Problem Relation Age of Onset  . Stroke Father   . Hypertension Father   . Head & neck cancer Father   . Hypertension Mother   . Skin cancer Mother   .  Prostate cancer Paternal Uncle     prostate ca    Review of Systems: Positive: left flank pain, nausea Negative:   A further 10 point review of systems was negative except what is listed in the HPI.                  Physical Exam: @VITALS2 @ General: In moderate distress.  Awake. Head:  Normocephalic.  Atraumatic. ENT:  EOMI.  Mucous membranes moist Neck:  Supple.  No lymphadenopathy. CV:  S1 present. S2 present. Regular rate. Pulmonary: Equal effort bilaterally.  Clear to auscultation bilaterally. Abdomen: Soft.  Left CVA tenderness noted. Skin:  Normal turgor.  No visible rash. Extremity: No gross deformity of bilateral upper extremities.  No gross deformity of                             lower extremities. Neurologic: Alert. Appropriate mood.    Studies:  Recent Labs     01/17/17  0102  HGB  14.9  WBC  5.9  PLT  179    Recent Labs     01/17/17  0102  NA  139  K  3.8  CL  108  CO2  21*  BUN  20  CREATININE  1.39*  CALCIUM  9.9  GFRNONAA  54*  GFRAA  >60     No results for input(s): INR, APTT in the last 72 hours.  Invalid input(s): PT   Invalid input(s): ABG    Assessment:  Left proximal ureteral stone.  Stone is within the shadow of the left kidney.  He has had Toradol, which precludes the ability to have lithotripsy.  Anyway, the stone was not easily visible on the scout film.  Plan: I will admit the patient for pain management.  Because of its inability to be seen on scout film/KUB, as well as its position near the kidney, with him having Toradol, shockwave lithotripsy is not an option.  I will discuss with him cystoscopy, ureteroscopy/lithotripsy versus cystoscopy and stent placement with later lithotripsy.

## 2017-01-17 NOTE — Anesthesia Procedure Notes (Addendum)
Procedure Name: LMA Insertion Date/Time: 01/17/2017 1:56 PM Performed by: Bethena Roys T Pre-anesthesia Checklist: Patient identified, Emergency Drugs available, Suction available and Patient being monitored Patient Re-evaluated:Patient Re-evaluated prior to inductionOxygen Delivery Method: Circle system utilized Preoxygenation: Pre-oxygenation with 100% oxygen Intubation Type: IV induction Ventilation: Mask ventilation without difficulty LMA: LMA inserted LMA Size: 5.0 Number of attempts: 1 Airway Equipment and Method: Bite block Placement Confirmation: positive ETCO2 Tube secured with: Tape Dental Injury: Teeth and Oropharynx as per pre-operative assessment

## 2017-01-17 NOTE — Anesthesia Preprocedure Evaluation (Signed)
Anesthesia Evaluation  Patient identified by MRN, date of birth, ID band Patient awake    Reviewed: Allergy & Precautions, NPO status , Patient's Chart, lab work & pertinent test results  Airway Mallampati: II  TM Distance: >3 FB Neck ROM: Full    Dental no notable dental hx.    Pulmonary former smoker,    Pulmonary exam normal breath sounds clear to auscultation       Cardiovascular hypertension, Pt. on medications Normal cardiovascular exam Rhythm:Regular Rate:Normal     Neuro/Psych negative neurological ROS  negative psych ROS   GI/Hepatic negative GI ROS, Neg liver ROS,   Endo/Other  negative endocrine ROS  Renal/GU negative Renal ROS  negative genitourinary   Musculoskeletal negative musculoskeletal ROS (+)   Abdominal   Peds negative pediatric ROS (+)  Hematology negative hematology ROS (+)   Anesthesia Other Findings lymphoma  Reproductive/Obstetrics negative OB ROS                             Anesthesia Physical Anesthesia Plan  ASA: II  Anesthesia Plan: General   Post-op Pain Management:    Induction: Intravenous  Airway Management Planned: LMA  Additional Equipment:   Intra-op Plan:   Post-operative Plan: Extubation in OR  Informed Consent: I have reviewed the patients History and Physical, chart, labs and discussed the procedure including the risks, benefits and alternatives for the proposed anesthesia with the patient or authorized representative who has indicated his/her understanding and acceptance.   Dental advisory given  Plan Discussed with: CRNA  Anesthesia Plan Comments:         Anesthesia Quick Evaluation

## 2017-01-17 NOTE — ED Notes (Addendum)
After patient was in the room for a few minutes, patients spouse came out to the nursing station asking about when someone was going to see him and about giving him pain medication. Informed spouse, one other patient needed to be medicated and would be in the room next to care for patient. While in the other patients room, overheard Chesley Noon, Animal nutritionist, tell spouse in the hallway to quit video recording in the hallway. Attempted to call Kallie Locks, RN (charge nurse) for assistance until I could care for the patient. At that moment she was busy. After caring for the other patient, went to bedside to care for patient. Pt was complaining about how many questions were being asked concerning assessing complaint and symptoms. Spouse was complaining about patient not receiving pain medication. Informed spouse that pain management protocol was initiated in triage with FENTANYL given. Before completion of assessment, provider came to bedside. Informed Girard Cooter, RN (Camera operator) of the situation. She also notified Joe, Therapist, sports (Financial risk analyst). Joe, RN went to bedside.

## 2017-01-17 NOTE — Anesthesia Postprocedure Evaluation (Signed)
Anesthesia Post Note  Patient: Kevin [name]  Procedure(s) Performed: Procedure(s) (LRB): CYSTOSCOPY WITH RETROGRADE PYELOGRAM, URETEROSCOPY, HOLMIUM LASER AND  LEFT STENT PLACEMENT (Left)  Patient location during evaluation: PACU Anesthesia Type: General Level of consciousness: awake and alert Pain management: pain level controlled Vital Signs Assessment: post-procedure vital signs reviewed and stable Respiratory status: spontaneous breathing, nonlabored ventilation and respiratory function stable Cardiovascular status: blood pressure returned to baseline and stable Postop Assessment: no signs of nausea or vomiting Anesthetic complications: no       Last Vitals:  Vitals:   01/17/17 1500 01/17/17 1515  BP: (!) 133/92 (!) 146/98  Pulse: 89 95  Resp: 11 16  Temp:      Last Pain:  Vitals:   01/17/17 1515  TempSrc:   PainSc: Asleep                 Alaric Gladwin A.

## 2017-01-17 NOTE — Op Note (Signed)
Preoperative diagnosis: 6 millimeter left proximal ureteral stone  Postoperative diagnosis: Same  Principal procedure: Cystoscopy, left retrograde ureteropyelogram with fluoroscopic interpretation, left ureteroscopy, holmium laser lithotripsy and extraction of left ureteral stone, placement of 6 French by 26 centimeter contour double-J stent with tether  Surgeon: Kingstyn Deruiter  Anesthesia: Gen. with LMA  Complications: None  Drains: Above-mentioned stent.  Specimen: Stone fragments, to the patient's wife  Indications: 61 year old male with recently diagnosed, significantly symptomatic left proximal ureteral stone.  He presented to the emergency room early this morning and was diagnosed with the stone.  Unfortunately, despite maximum narcotic injections, he was still significantly symptomatic with pain.  The stone is not easily seen on a KUB/scalp film.  Because of this, and the patient's urgent need for stone management, he presents for ureteroscopy with possible laser and extraction of stone.  The procedure as well as risks and complications were discussed with the patient.  He understands these and desires to proceed.  Description of procedure: The patient was properly identified and marked in the holding area.  He received preoperative IV Ancef.  He was then taken to the operating room where anesthetic was administered with the LMA.  He is placed in the dorsolithotomy position.  Genitalia were prepped and draped.  Proper timeout was performed.  A 21 French panendoscope was advanced under direct vision through his urethra which was normal.  Prostate was absent secondary to prior surgery.  Bladder was entered and inspected circumferentially.  There were no tumors, trabeculations or foreign bodies.  Ureteral orifices were normal in configuration and location.  The graft the left ureteral orifice was cannulated with a 6 [place] open-ended catheter.  Using Omnipaque, gentle retrograde pyelogram was  performed.  This revealed a filling defect in the proximal ureter consistent with the previously mentioned stone.  There was no significant colonization/hydronephrosis proximal to this.  Pyelocalyceal systems were normal in appearance with no evidence of filling defect.  I then advanced a 0.038 inch sensor-tip guidewire through the open-ended catheter with a curl eventually seen in the upper pole calyceal system.  The open-ended catheter, and the cystoscope were removed.  Following drainage of the bladder.  I then passed the inner core and then the entire 12/14 ureteral access catheter, sequentially dilating the distal and the mid ureter quite easily.  Fluoroscopic assistance was used for this.  Following this, the ureteral access catheter was removed.  I then passed the 6 French semirigid ureteroscope under direct vision.  It easily passed through the urethra and up to the mid/proximal ureter where the stone was identified.  I felt it was too large to be grasped and extracted in one piece.  I then, through the ureteroscope, passed the fiberoptic to the stone.  Using holmium laser energy at 0.5 joules and 15 hertz, the stone was fragmented into approximately 3 smaller fragments.  These were easily grasped with the engage basket and extracted into the bladder.  Reinspection of the entire ureter from the UVJ.  All the way up to the renal pelvis revealed no further fragments.  At this point, the ureteroscope was removed, with the guidewire left in.  The cystoscope was passed into the bladder where the fragments were easily rinsed from the bladder and saved for specimen.  The cystoscope was then removed, and the guidewire backloaded through the scope.  Using fluoroscopic and cystoscopic guidance, a 6 [place] by 26 centimeter contour double-J stent was passed and deployed in the ureter once a guidewire was removed.  The thread was left on the end of the stent.  The bladder was drained, the cystoscope removed, and then  the tether taped to the patient's penis.  The procedure was then terminated.  The patient was awakened and taken to the PACU in stable condition.

## 2017-01-17 NOTE — ED Notes (Signed)
Pt is in CT and ambulated with a steady gait.

## 2017-01-17 NOTE — ED Provider Notes (Signed)
[facility] DEPT Provider Note   CSN: [id number] Arrival date & time: 01/16/17  2355  By signing my name below, I, Kevin [name], attest that this documentation has been prepared under the direction and in the presence of [employer], PA-C. Electronically Signed: Dora [name], Scribe. 01/17/2017. 12:59 AM.  History   Chief Complaint Chief Complaint  Patient presents with  . Flank Pain    The history is provided by the patient. No language interpreter was used.     HPI Comments: Kevin [name] is a 61 y.o. male not anticoagulated, with PMHx including prostate cancer, s/p resection -- who presents to the Emergency Department complaining of sudden onset, constant, severe, left flank pain for a couple of hours. He reports associated nausea. No fevers. No alleviating factors noted. He states the pain radiates towards the right groin. No h/o kidney stones. Pt denies hematuria, hematochezia, vomiting, or any other associated symptoms. States has seen Dr. Tresa Moore in past.   Past Medical History:  Diagnosis Date  . Hypertension   . 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 Prostate cancer [facility])     Patient Active Problem List   Diagnosis Date Noted  . Malignant neoplasm of prostate ([place]) 04/19/2016    Past Surgical History:  Procedure Laterality Date  . CHOLECYSTECTOMY    . PROSTATE SURGERY    . receiving chronic interferon     . TONSILLECTOMY         Home Medications    Prior to Admission medications   Medication Sig Start Date End Date Taking? Authorizing Provider  interferon alfa-2b 20 Million Units/m2 in sodium chloride 0.9 % 100 mL  04/16/16   Historical Provider, MD  milk thistle 175 MG tablet Take 175 mg by mouth daily.    Historical Provider, MD  Red Yeast Rice Extract 600 MG CAPS Take by mouth.    Historical Provider, MD    Family History Family History  Problem Relation Age of Onset  . Stroke Father   . Hypertension Father     . Head & neck cancer Father   . Hypertension Mother   . Skin cancer Mother   . Prostate cancer Paternal Uncle     prostate ca    Social History Social History  Substance Use Topics  . Smoking status: Former Smoker    Years: 10.00    Types: Cigarettes    Quit date: 10/08/2014  . Smokeless tobacco: Never Used  . Alcohol use Yes     Comment: 2-5 per day     Allergies   Patient has no known allergies.   Review of Systems Review of Systems  Constitutional: Negative for fever.  HENT: Negative for rhinorrhea and sore throat.   Eyes: Negative for redness.  Respiratory: Negative for cough.   Cardiovascular: Negative for chest pain.  Gastrointestinal: Positive for nausea. Negative for abdominal pain, blood in stool, diarrhea and vomiting.  Genitourinary: Positive for flank pain (L). Negative for dysuria, hematuria and testicular pain.  Musculoskeletal: Negative for myalgias.  Skin: Negative for rash.  Neurological: Negative for headaches.   Physical Exam Updated Vital Signs BP (!) 165/102 (BP Location: Left Arm)   Pulse 60   Temp 97.8 F (36.6 C) (Oral)   Resp 18   Ht 6' (1.829 m)   Wt 200 lb (90.7 kg)   SpO2 100%   BMI 27.12 kg/m   Physical Exam  Constitutional: He is oriented to person, place, and time. He  appears well-developed and well-nourished. He appears distressed (pacing in room).  HENT:  Head: Normocephalic and atraumatic.  Mouth/Throat: Oropharynx is clear and moist.  Eyes: Conjunctivae and EOM are normal. Right eye exhibits no discharge. Left eye exhibits no discharge.  Neck: Normal range of motion. Neck supple. No tracheal deviation present.  Cardiovascular: Normal rate, regular rhythm and normal heart sounds.   Pulmonary/Chest: Effort normal and breath sounds normal. No respiratory distress. He has no rales.  Abdominal: Soft. There is tenderness. There is no rebound and no guarding.  Musculoskeletal: Normal range of motion.  Neurological: He is alert  and oriented to person, place, and time.  Skin: Skin is warm and dry.  Psychiatric: He has a normal mood and affect. His behavior is normal.  Nursing note and vitals reviewed.  ED Treatments / Results  Labs (all labs ordered are listed, but only abnormal results are displayed) Labs Reviewed  BASIC METABOLIC PANEL - Abnormal; Notable for the following:       Result Value   CO2 21 (*)    Glucose, Bld 176 (*)    Creatinine, Ser 1.39 (*)    GFR calc non Af Amer 54 (*)    All other components within normal limits  URINALYSIS, ROUTINE W REFLEX MICROSCOPIC - Abnormal; Notable for the following:    APPearance HAZY (*)    Hgb urine dipstick LARGE (*)    Leukocytes, UA TRACE (*)    Bacteria, UA MANY (*)    All other components within normal limits  URINE CULTURE  CBC WITH DIFFERENTIAL/PLATELET    Radiology Ct Renal Stone Study  Result Date: 01/17/2017 CLINICAL DATA:  Acute onset of left flank pain and nausea. Initial encounter. EXAM: CT ABDOMEN AND PELVIS WITHOUT CONTRAST TECHNIQUE: Multidetector CT imaging of the abdomen and pelvis was performed following the standard protocol without IV contrast. COMPARISON:  CT of the abdomen and pelvis performed 08/18/2014 FINDINGS: Lower chest: The visualized lung bases are grossly clear. The visualized portions of the mediastinum are unremarkable. Hepatobiliary: The liver is unremarkable in appearance. The patient is status post cholecystectomy, with clips noted at the gallbladder fossa. The common bile duct remains normal in caliber. Pancreas: The pancreas is within normal limits. Spleen: The spleen is unremarkable in appearance. Adrenals/Urinary Tract: The adrenal glands are unremarkable in appearance. Minimal left-sided hydronephrosis is noted, with an obstructing 6 x 4 mm stone noted at the proximal left ureter, just below the left renal pelvis. Left-sided perinephric stranding is noted. Fluid is seen tracking inferiorly along the left ureter. A tiny 3  mm stone is noted at the interpole region of the right kidney. Stomach/Bowel: The stomach is unremarkable in appearance. The small bowel is within normal limits. The appendix is normal in caliber, without evidence of appendicitis. The colon is unremarkable in appearance. Vascular/Lymphatic: Scattered calcification is seen along the distal abdominal aorta and its branches. The abdominal aorta is otherwise grossly unremarkable. The inferior vena cava is grossly unremarkable. No retroperitoneal lymphadenopathy is seen. No pelvic sidewall lymphadenopathy is identified. Reproductive: The bladder is decompressed and not well assessed. The patient is status post prostatectomy. Other: No additional soft tissue abnormalities are seen. Musculoskeletal: No acute osseous abnormalities are identified. The visualized musculature is unremarkable in appearance. IMPRESSION: 1. Minimal left-sided hydronephrosis, with an obstructing 6 x 4 mm stone at the proximal left ureter, just above the left renal pelvis. Left-sided perinephric stranding noted. Fluid tracks inferiorly along the left ureter. 2. Tiny 3 mm stone at the  interpole region of the right kidney. 3. Scattered aortic atherosclerosis. Electronically Signed   By: Garald Balding M.D.   On: 01/17/2017 02:16    Procedures Procedures (including critical care time)  DIAGNOSTIC STUDIES: Oxygen Saturation is 100% on RA, normal by my interpretation.    COORDINATION OF CARE: 1:02 AM Discussed treatment plan with pt at bedside and pt agreed to plan. Patient appears very uncomfortable. IV not yet established. Patient and wife are very upset that his pain has not yet been adequately controlled (arrived 1 hr ago, received fentanyl per triage protocol). I have ordered IM toradol to facilitate pain control prior to CT. No signs of hemodynamic instability. No history of anticoagulation or GI bleeding, CKD.   Medications Ordered in ED Medications  fentaNYL (SUBLIMAZE) injection  50 mcg (50 mcg Nasal Given 01/17/17 0019)  ondansetron (ZOFRAN) injection 4 mg (4 mg Intravenous Given 01/17/17 0115)  ketorolac (TORADOL) injection 60 mg (60 mg Intramuscular Given 01/17/17 0107)  HYDROmorphone (DILAUDID) injection 1 mg (1 mg Intravenous Given 01/17/17 0526)  sodium chloride 0.9 % bolus 1,000 mL (0 mLs Intravenous Stopped 01/17/17 0526)  oxyCODONE-acetaminophen (PERCOCET/ROXICET) 5-325 MG per tablet 2 tablet (2 tablets Oral Given 01/17/17 0420)     Initial Impression / Assessment and Plan / ED Course  I have reviewed the triage vital signs and the nursing notes.  Pertinent labs & imaging results that were available during my care of the patient were reviewed by me and considered in my medical decision making (see chart for details).     Patient with excellent response to IM toradol. Awaiting CT scan.   CT findings discussed with patient as above. Pain returned and IV dilaudid ordered with good response. Will transition to PO percocet.   Patient stable after PO Percocet. Will monitor.   Patient's pain returned, not as severe, but patient very uncomfortable. Additional dose of IV dilaudid given.   Discussed with Dr. Florina Ou who has seen patient. Will discuss with urology given intractable symptoms.   Spoke with Dr. Diona Fanti of urology who will see in ED. Pending reccs. Patient and family updated. He is currently comfortable.   Final Clinical Impressions(s) / ED Diagnoses   Final diagnoses:  Ureteral colic  Intractable pain  Hydronephrosis with urinary obstruction due to ureteral calculus   Pending urology reccs.   New Prescriptions New Prescriptions   No medications on file   I personally performed the services described in this documentation, which was scribed in my presence. The recorded information has been reviewed and is accurate.    Carlisle Cater, PA-C 01/17/17 [place], MD 01/17/17 [phone]

## 2017-01-17 NOTE — Progress Notes (Signed)
Left message with Dr Dahlstedt's office nurse inquiring if Dr Diona Fanti wants patient to take his scheduled Amlodipine and Irbesartan with a sip of water prior to surgery or hold meds until after surgery because order is currently NPO. His nurse stated that Dr Diona Fanti is currently in surgery and not expected back at the office until 1200 but will give him the message along with my contact phone number when she sees him.

## 2017-01-17 NOTE — ED Notes (Signed)
Pt to be npo.

## 2017-01-17 NOTE — Anesthesia Preprocedure Evaluation (Addendum)
Anesthesia Evaluation  Patient identified by MRN, date of birth, ID band Patient awake    Reviewed: Allergy & Precautions, NPO status , Patient's Chart, lab work & pertinent test results  Airway Mallampati: II  TM Distance: >3 FB Neck ROM: Full    Dental no notable dental hx.    Pulmonary former smoker,    Pulmonary exam normal breath sounds clear to auscultation       Cardiovascular hypertension, Pt. on medications negative cardio ROS Normal cardiovascular exam Rhythm:Regular Rate:Normal     Neuro/Psych negative neurological ROS  negative psych ROS   GI/Hepatic negative GI ROS, Neg liver ROS,   Endo/Other  negative endocrine ROS  Renal/GU negative Renal ROS  negative genitourinary   Musculoskeletal negative musculoskeletal ROS (+)   Abdominal   Peds negative pediatric ROS (+)  Hematology negative hematology ROS (+)   Anesthesia Other Findings   Reproductive/Obstetrics negative OB ROS                            Anesthesia Physical Anesthesia Plan  ASA: II  Anesthesia Plan: General   Post-op Pain Management:    Induction: Intravenous  Airway Management Planned: LMA  Additional Equipment:   Intra-op Plan:   Post-operative Plan: Extubation in OR  Informed Consent: I have reviewed the patients History and Physical, chart, labs and discussed the procedure including the risks, benefits and alternatives for the proposed anesthesia with the patient or authorized representative who has indicated his/her understanding and acceptance.   Dental advisory given  Plan Discussed with: CRNA  Anesthesia Plan Comments:         Anesthesia Quick Evaluation

## 2017-01-17 NOTE — Discharge Instructions (Signed)
1. You may see some blood in the urine and may have some burning with urination for 48-72 hours. You also may notice that you have to urinate more frequently or urgently after your procedure which is normal.  2. You should call should you develop an inability urinate, fever > 101, persistent nausea and vomiting that prevents you from eating or drinking to stay hydrated.  3. If you have a stent, you will likely urinate more frequently and urgently until the stent is removed and you may experience some discomfort/pain in the lower abdomen and flank especially when urinating. You may take pain medication prescribed to you if needed for pain. You may also intermittently have blood in the urine until the stent is removed.  It is okay to pull the string to remove the stent on Monday morning. 4. If you have a catheter, you will be taught how to take care of the catheter by the nursing staff prior to discharge from the hospital.  You may periodically feel a strong urge to void with the catheter in place.  This is a bladder spasm and most often can occur when having a bowel movement or moving around. It is typically self-limited and usually will stop after a few minutes.  You may use some Vaseline or Neosporin around the tip of the catheter to reduce friction at the tip of the penis. You may also see some blood in the urine.  A very small amount of blood can make the urine look quite red.  As long as the catheter is draining well, there usually is not a problem.  However, if the catheter is not draining well and is bloody, you should call the office [phone]) to notify us. [facility] [phone] Post Ureteroscopy With or Without Stent Instructions  Definitions:  Ureter: The duct that transports urine from the kidney to the bladder. Stent:   A plastic hollow tube that is placed into the ureter, from the kidney to the bladder to prevent the ureter from swelling shut.  GENERAL  INSTRUCTIONS:  Despite the fact that no skin incisions were used, the area around the ureter and bladder is raw and irritated. The stent is a foreign body which will further irritate the bladder wall. This irritation is manifested by increased frequency of urination, both day and night, and by an increase in the urge to urinate. In some, the urge to urinate is present almost always. Sometimes the urge is strong enough that you may not be able to stop yourself from urinating. The only real cure is to remove the stent and then give time for the bladder wall to heal which can't be done until the danger of the ureter swelling shut has passed, which varies.  You may see some blood in your urine while the stent is in place and a few days afterwards. Do not be alarmed, even if the urine was clear for a while. Get off your feet and drink lots of fluids until clearing occurs. If you start to pass clots or don't improve, call us.  DIET: You may return to your normal diet immediately. Because of the raw surface of your bladder, alcohol, spicy foods, acid type foods and drinks with caffeine may cause irritation or frequency and should be used in moderation. To keep your urine flowing freely and to avoid constipation, drink plenty of fluids during the day ( 8-10 glasses ). Tip: Avoid cranberry juice because it is very acidic.  ACTIVITY: Your physical  activity doesn't need to be restricted. However, if you are very active, you may see some blood in your urine. We suggest that you reduce your activity under these circumstances until the bleeding has stopped.  BOWELS: It is important to keep your bowels regular during the postoperative period. Straining with bowel movements can cause bleeding. A bowel movement every other day is reasonable. Use a mild laxative if needed, such as Milk of Magnesia 2-3 tablespoons, or 2 Dulcolax tablets. Call if you continue to have problems. If you have been taking narcotics for pain,  before, during or after your surgery, you may be constipated. Take a laxative if necessary.   MEDICATION: You should resume your pre-surgery medications unless told not to. In addition you will often be given an antibiotic to prevent infection. These should be taken as prescribed until the bottles are finished unless you are having an unusual reaction to one of the drugs.  PROBLEMS YOU SHOULD REPORT TO [place]:  Fevers over 100.5 Fahrenheit.  Heavy bleeding, or clots ( See above notes about blood in urine ).  Inability to urinate.  Drug reactions ( hives, rash, nausea, vomiting, diarrhea ).  Severe burning or pain with urination that is not improving.  FOLLOW-UP: You will need a follow-up appointment to monitor your progress. Call for this appointment at the number listed above. Usually the first appointment will be about three to fourteen days after your surgery.  Post Anesthesia Home Care Instructions  Activity: Get plenty of rest for the remainder of the day. A responsible individual must stay with you for 24 hours following the procedure.  For the next 24 hours, DO NOT: -Drive a car -Paediatric nurse -Drink alcoholic beverages -Take any medication unless instructed by your physician -Make any legal decisions or sign important papers.  Meals: Start with liquid foods such as gelatin or soup. Progress to regular foods as tolerated. Avoid greasy, spicy, heavy foods. If nausea and/or vomiting occur, drink only clear liquids until the nausea and/or vomiting subsides. Call your physician if vomiting continues.  Special Instructions/Symptoms: Your throat may feel dry or sore from the anesthesia or the breathing tube placed in your throat during surgery. If this causes discomfort, gargle with warm salt water. The discomfort should disappear within 24 hours.  If you had a scopolamine patch placed behind your ear for the management of post- operative nausea and/or vomiting:  1. The  medication in the patch is effective for 72 hours, after which it should be removed.  Wrap patch in a tissue and discard in the trash. Wash hands thoroughly with soap and water. 2. You may remove the patch earlier than 72 hours if you experience unpleasant side effects which may include dry mouth, dizziness or visual disturbances. 3. Avoid touching the patch. Wash your hands with soap and water after contact with the patch.

## 2017-01-17 NOTE — ED Notes (Signed)
Bed: WA04 Expected date:  Expected time:  Means of arrival:  Comments: 

## 2017-01-17 NOTE — ED Notes (Signed)
Pt. In CT. 

## 2017-01-17 NOTE — ED Triage Notes (Signed)
Pt states about an hour ago he began to feel severe left flank pain and nausea. Ambulatory and A&O x4. No hx of kidney stones. Patient unable to sit still in triage.

## 2017-01-17 NOTE — Transfer of Care (Signed)
Immediate Anesthesia Transfer of Care Note  Patient: Kevin [name]  Procedure(s) Performed: Procedure(s): CYSTOSCOPY WITH RETROGRADE PYELOGRAM, URETEROSCOPY, HOLMIUM LASER AND  LEFT STENT PLACEMENT (Left)  Patient Location: PACU  Anesthesia Type:General  Level of Consciousness: sedated and responds to stimulation  Airway & Oxygen Therapy: Patient Spontanous Breathing and Patient connected to nasal cannula oxygen  Post-op Assessment: Report given to RN  Post vital signs: Reviewed and stable  Last Vitals:  Vitals:   01/17/17 0848 01/17/17 1234  BP: 125/85 127/81  Pulse: 71 82  Resp: 20 18  Temp: 36.7 C 37.6 C    Last Pain:  Vitals:   01/17/17 1234  TempSrc: Oral  PainSc:          Complications: No apparent anesthesia complications

## 2017-01-17 NOTE — Progress Notes (Signed)
S/W Dr Diona Fanti via telephone. Verbal order to hold PO Amlodipine and Irbesartan taken.

## 2017-01-17 NOTE — ED Notes (Signed)
Provider at bedside

## 2017-01-17 NOTE — Progress Notes (Addendum)
Patient transported to [place] [place] via bed at 1225. PCA Dilaudid discontinued.

## 2017-01-18 LAB — HIV ANTIBODY (ROUTINE TESTING W REFLEX): HIV SCREEN 4TH GENERATION: NONREACTIVE

## 2017-01-19 LAB — URINE CULTURE: Culture: >=100000 — AB

## 2017-01-20 ENCOUNTER — Emergency Department (HOSPITAL_COMMUNITY): Payer: BLUE CROSS/BLUE SHIELD | Admitting: Certified Registered"

## 2017-01-20 ENCOUNTER — Emergency Department (HOSPITAL_COMMUNITY): Payer: BLUE CROSS/BLUE SHIELD

## 2017-01-20 ENCOUNTER — Encounter (HOSPITAL_COMMUNITY)
Admission: EM | Disposition: A | Discharge status: Home / Self Care | Payer: Self-pay | Source: Home / Self Care | Attending: Urology

## 2017-01-20 ENCOUNTER — Inpatient Hospital Stay (HOSPITAL_COMMUNITY)
Admission: EM | Admit: 2017-01-20 | Discharge: 2017-01-25 | DRG: 699 | Disposition: A | Discharge status: Home / Self Care | Payer: BLUE CROSS/BLUE SHIELD | Attending: Urology | Admitting: Urology

## 2017-01-20 ENCOUNTER — Encounter (HOSPITAL_COMMUNITY): Payer: Self-pay | Admitting: Emergency Medicine

## 2017-01-20 DIAGNOSIS — Y828 Other medical devices associated with adverse incidents: Secondary | ICD-10-CM | POA: Diagnosis not present

## 2017-01-20 DIAGNOSIS — Z79899 Other long term (current) drug therapy: Secondary | ICD-10-CM | POA: Diagnosis not present

## 2017-01-20 DIAGNOSIS — Z808 Family history of malignant neoplasm of other organs or systems: Secondary | ICD-10-CM | POA: Diagnosis not present

## 2017-01-20 DIAGNOSIS — Z87891 Personal history of nicotine dependence: Secondary | ICD-10-CM

## 2017-01-20 DIAGNOSIS — B9620 Unspecified Escherichia coli [E. coli] as the cause of diseases classified elsewhere: Secondary | ICD-10-CM | POA: Diagnosis present

## 2017-01-20 DIAGNOSIS — N453 Epididymo-orchitis: Secondary | ICD-10-CM | POA: Diagnosis not present

## 2017-01-20 DIAGNOSIS — R0602 Shortness of breath: Secondary | ICD-10-CM | POA: Diagnosis not present

## 2017-01-20 DIAGNOSIS — N136 Pyonephrosis: Secondary | ICD-10-CM | POA: Diagnosis present

## 2017-01-20 DIAGNOSIS — Z8546 Personal history of malignant neoplasm of prostate: Secondary | ICD-10-CM

## 2017-01-20 DIAGNOSIS — K567 Ileus, unspecified: Secondary | ICD-10-CM | POA: Diagnosis not present

## 2017-01-20 DIAGNOSIS — Z87442 Personal history of urinary calculi: Secondary | ICD-10-CM | POA: Diagnosis not present

## 2017-01-20 DIAGNOSIS — N5089 Other specified disorders of the male genital organs: Secondary | ICD-10-CM | POA: Diagnosis not present

## 2017-01-20 DIAGNOSIS — I10 Essential (primary) hypertension: Secondary | ICD-10-CM | POA: Diagnosis not present

## 2017-01-20 DIAGNOSIS — R000 Tachycardia, unspecified: Secondary | ICD-10-CM | POA: Diagnosis present

## 2017-01-20 DIAGNOSIS — A419 Sepsis, unspecified organism: Secondary | ICD-10-CM | POA: Diagnosis not present

## 2017-01-20 DIAGNOSIS — N132 Hydronephrosis with renal and ureteral calculous obstruction: Secondary | ICD-10-CM | POA: Diagnosis not present

## 2017-01-20 DIAGNOSIS — N135 Crossing vessel and stricture of ureter without hydronephrosis: Secondary | ICD-10-CM | POA: Diagnosis not present

## 2017-01-20 DIAGNOSIS — T8389XA Other specified complication of genitourinary prosthetic devices, implants and grafts, initial encounter: Principal | ICD-10-CM | POA: Diagnosis present

## 2017-01-20 DIAGNOSIS — Z8249 Family history of ischemic heart disease and other diseases of the circulatory system: Secondary | ICD-10-CM | POA: Diagnosis not present

## 2017-01-20 DIAGNOSIS — N492 Inflammatory disorders of scrotum: Secondary | ICD-10-CM | POA: Diagnosis present

## 2017-01-20 DIAGNOSIS — R109 Unspecified abdominal pain: Secondary | ICD-10-CM

## 2017-01-20 DIAGNOSIS — Z8572 Personal history of non-Hodgkin lymphomas: Secondary | ICD-10-CM | POA: Diagnosis not present

## 2017-01-20 DIAGNOSIS — N201 Calculus of ureter: Secondary | ICD-10-CM | POA: Diagnosis not present

## 2017-01-20 DIAGNOSIS — Z823 Family history of stroke: Secondary | ICD-10-CM | POA: Diagnosis not present

## 2017-01-20 DIAGNOSIS — Z466 Encounter for fitting and adjustment of urinary device: Secondary | ICD-10-CM | POA: Diagnosis not present

## 2017-01-20 DIAGNOSIS — N12 Tubulo-interstitial nephritis, not specified as acute or chronic: Secondary | ICD-10-CM

## 2017-01-20 DIAGNOSIS — Z8042 Family history of malignant neoplasm of prostate: Secondary | ICD-10-CM | POA: Diagnosis not present

## 2017-01-20 DIAGNOSIS — R1032 Left lower quadrant pain: Secondary | ICD-10-CM | POA: Diagnosis not present

## 2017-01-20 DIAGNOSIS — R509 Fever, unspecified: Secondary | ICD-10-CM | POA: Diagnosis not present

## 2017-01-20 HISTORY — PX: CYSTOSCOPY W/ RETROGRADES: SHX1426

## 2017-01-20 LAB — BASIC METABOLIC PANEL
ANION GAP: 8 (ref 5–15)
BUN: 22 mg/dL — ABNORMAL HIGH (ref 6–20)
CALCIUM: 8.7 mg/dL — AB (ref 8.9–10.3)
CO2: 22 mmol/L (ref 22–32)
Chloride: 107 mmol/L (ref 101–111)
Creatinine, Ser: 1.72 mg/dL — ABNORMAL HIGH (ref 0.61–1.24)
GFR, EST AFRICAN AMERICAN: 49 mL/min — AB (ref >60)
GFR, EST NON AFRICAN AMERICAN: 42 mL/min — AB (ref >60)
Glucose, Bld: 105 mg/dL — ABNORMAL HIGH (ref 65–99)
Potassium: 3.5 mmol/L (ref 3.5–5.1)
Sodium: 137 mmol/L (ref 135–145)

## 2017-01-20 LAB — CBC
HCT: 37.2 % — ABNORMAL LOW (ref 39.0–52.0)
Hemoglobin: 12.9 g/dL — ABNORMAL LOW (ref 13.0–17.0)
MCH: 31.4 pg (ref 26.0–34.0)
MCHC: 34.7 g/dL (ref 30.0–36.0)
MCV: 90.5 fL (ref 78.0–100.0)
PLATELETS: 114 10*3/uL — AB (ref 150–400)
RBC: 4.11 MIL/uL — ABNORMAL LOW (ref 4.22–5.81)
RDW: 14 % (ref 11.5–15.5)
WBC: 8.6 10*3/uL (ref 4.0–10.5)

## 2017-01-20 LAB — URINALYSIS, ROUTINE W REFLEX MICROSCOPIC
BACTERIA UA: NONE SEEN
BILIRUBIN URINE: NEGATIVE
Glucose, UA: NEGATIVE mg/dL
KETONES UR: 5 mg/dL — AB
NITRITE: NEGATIVE
PROTEIN: 30 mg/dL — AB
Specific Gravity, Urine: 1.021 (ref 1.005–1.030)
Squamous Epithelial / LPF: NONE SEEN
pH: 5 (ref 5.0–8.0)

## 2017-01-20 LAB — I-STAT CG4 LACTIC ACID, ED: Lactic Acid, Venous: 0.84 mmol/L (ref 0.5–1.9)

## 2017-01-20 SURGERY — CYSTOSCOPY, WITH RETROGRADE PYELOGRAM
Anesthesia: General | Site: Ureter | Laterality: Left

## 2017-01-20 MED ORDER — HYDROMORPHONE HCL 2 MG/ML IJ SOLN
1.0000 mg | Freq: Once | INTRAMUSCULAR | Status: AC
  Administered 2017-01-20: 1 mg via INTRAVENOUS
  Filled 2017-01-20: qty 1

## 2017-01-20 MED ORDER — DIPHENHYDRAMINE HCL 50 MG/ML IJ SOLN
12.5000 mg | Freq: Four times a day (QID) | INTRAMUSCULAR | Status: DC | PRN
Start: 2017-01-20 — End: 2017-01-25

## 2017-01-20 MED ORDER — ONDANSETRON HCL 4 MG/2ML IJ SOLN
INTRAMUSCULAR | Status: DC | PRN
  Administered 2017-01-20: 4 mg via INTRAVENOUS

## 2017-01-20 MED ORDER — HYDROMORPHONE HCL 2 MG/ML IJ SOLN
1.0000 mg | Freq: Once | INTRAMUSCULAR | Status: DC
Start: 2017-01-20 — End: 2017-01-20

## 2017-01-20 MED ORDER — MIDAZOLAM HCL 2 MG/2ML IJ SOLN
INTRAMUSCULAR | Status: AC
  Filled 2017-01-20: qty 2

## 2017-01-20 MED ORDER — CEFTRIAXONE SODIUM 1 G IJ SOLR
1.0000 g | INTRAMUSCULAR | Status: DC
  Administered 2017-01-21 – 2017-01-23 (×3): 1 g via INTRAVENOUS
  Filled 2017-01-20 (×3): qty 10

## 2017-01-20 MED ORDER — PHENYLEPHRINE 40 MCG/ML (10ML) SYRINGE FOR IV PUSH (FOR BLOOD PRESSURE SUPPORT)
PREFILLED_SYRINGE | INTRAVENOUS | Status: DC | PRN
  Administered 2017-01-20: 80 ug via INTRAVENOUS

## 2017-01-20 MED ORDER — SODIUM CHLORIDE 0.9 % IV SOLN
INTRAVENOUS | Status: DC | PRN
  Administered 2017-01-20: 13:00:00

## 2017-01-20 MED ORDER — FENTANYL CITRATE (PF) 100 MCG/2ML IJ SOLN
INTRAMUSCULAR | Status: AC
  Filled 2017-01-20: qty 2

## 2017-01-20 MED ORDER — BELLADONNA ALKALOIDS-OPIUM 16.2-60 MG RE SUPP
1.0000 | Freq: Four times a day (QID) | RECTAL | Status: DC | PRN
  Administered 2017-01-20: 1 via RECTAL
  Filled 2017-01-20: qty 1

## 2017-01-20 MED ORDER — FENTANYL CITRATE (PF) 100 MCG/2ML IJ SOLN: 25.0000 ug | INTRAMUSCULAR | Status: DC | PRN

## 2017-01-20 MED ORDER — FUROSEMIDE 10 MG/ML IJ SOLN
10.0000 mg | Freq: Once | INTRAMUSCULAR | Status: DC
  Administered 2017-01-20: 10 mg via INTRAVENOUS

## 2017-01-20 MED ORDER — PROPOFOL 10 MG/ML IV BOLUS
INTRAVENOUS | Status: DC | PRN
  Administered 2017-01-20: 200 mg via INTRAVENOUS

## 2017-01-20 MED ORDER — SODIUM CHLORIDE 0.9 % IR SOLN
Status: DC | PRN
  Administered 2017-01-20: 3000 mL

## 2017-01-20 MED ORDER — HYDROCODONE-ACETAMINOPHEN 5-325 MG PO TABS
1.0000 | ORAL_TABLET | ORAL | Status: DC | PRN
Start: 2017-01-20 — End: 2017-01-25
  Administered 2017-01-20: 1 via ORAL
  Administered 2017-01-21 – 2017-01-22 (×6): 2 via ORAL
  Filled 2017-01-20 (×6): qty 2
  Filled 2017-01-20: qty 1
  Filled 2017-01-20 (×2): qty 2

## 2017-01-20 MED ORDER — FENTANYL CITRATE (PF) 100 MCG/2ML IJ SOLN
INTRAMUSCULAR | Status: DC | PRN
  Administered 2017-01-20: 50 ug via INTRAVENOUS

## 2017-01-20 MED ORDER — ACETAMINOPHEN 650 MG RE SUPP
650.0000 mg | Freq: Once | RECTAL | Status: AC
  Administered 2017-01-20: 650 mg via RECTAL
  Filled 2017-01-20: qty 1

## 2017-01-20 MED ORDER — HYDROMORPHONE HCL 2 MG/ML IJ SOLN
0.5000 mg | INTRAMUSCULAR | Status: DC | PRN
  Administered 2017-01-20 – 2017-01-21 (×5): 1 mg via INTRAVENOUS
  Filled 2017-01-20 (×5): qty 1

## 2017-01-20 MED ORDER — DIPHENHYDRAMINE HCL 12.5 MG/5ML PO ELIX: 12.5000 mg | ORAL_SOLUTION | Freq: Four times a day (QID) | ORAL | Status: DC | PRN

## 2017-01-20 MED ORDER — SODIUM CHLORIDE 0.9 % IV BOLUS (SEPSIS)
1000.0000 mL | Freq: Once | INTRAVENOUS | Status: AC
  Administered 2017-01-20: 1000 mL via INTRAVENOUS

## 2017-01-20 MED ORDER — LACTATED RINGERS IV SOLN
INTRAVENOUS | Status: DC
  Administered 2017-01-20 (×2): via INTRAVENOUS

## 2017-01-20 MED ORDER — MIDAZOLAM HCL 2 MG/2ML IJ SOLN
INTRAMUSCULAR | Status: DC | PRN
  Administered 2017-01-20: 1 mg via INTRAVENOUS

## 2017-01-20 MED ORDER — ONDANSETRON HCL 4 MG/2ML IJ SOLN
4.0000 mg | Freq: Once | INTRAMUSCULAR | Status: AC
  Administered 2017-01-20: 4 mg via INTRAVENOUS
  Filled 2017-01-20: qty 2

## 2017-01-20 MED ORDER — LIDOCAINE 2% (20 MG/ML) 5 ML SYRINGE
INTRAMUSCULAR | Status: DC | PRN
  Administered 2017-01-20: 100 mg via INTRAVENOUS

## 2017-01-20 MED ORDER — FUROSEMIDE 10 MG/ML IJ SOLN
INTRAMUSCULAR | Status: AC
Start: 2017-01-20 — End: 2017-01-20
  Administered 2017-01-20: 10 mg via INTRAVENOUS
  Filled 2017-01-20: qty 2

## 2017-01-20 MED ORDER — SODIUM CHLORIDE 0.9 % IV SOLN
INTRAVENOUS | Status: DC | PRN
  Administered 2017-01-20: 13:00:00 via INTRAVENOUS

## 2017-01-20 MED ORDER — LACTATED RINGERS IV SOLN
INTRAVENOUS | Status: DC | PRN
  Administered 2017-01-20: 13:00:00 via INTRAVENOUS

## 2017-01-20 MED ORDER — ZOLPIDEM TARTRATE 5 MG PO TABS: 5.0000 mg | ORAL_TABLET | Freq: Every evening | ORAL | Status: DC | PRN

## 2017-01-20 MED ORDER — PROPOFOL 10 MG/ML IV BOLUS
INTRAVENOUS | Status: AC
  Filled 2017-01-20: qty 20

## 2017-01-20 MED ORDER — ACITRETIN 25 MG PO CAPS
25.0000 mg | ORAL_CAPSULE | Freq: Every day | ORAL | Status: DC
  Filled 2017-01-20 (×2): qty 1

## 2017-01-20 MED ORDER — DEXTROSE 5 % IV SOLN
1.0000 g | INTRAVENOUS | Status: DC
  Administered 2017-01-20: 1 g via INTRAVENOUS
  Filled 2017-01-20: qty 10

## 2017-01-20 MED ORDER — BISACODYL 10 MG RE SUPP
10.0000 mg | Freq: Every day | RECTAL | Status: DC | PRN
  Administered 2017-01-21: 10 mg via RECTAL
  Filled 2017-01-20: qty 1

## 2017-01-20 SURGICAL SUPPLY — 18 items
BAG URO CATCHER STRL LF (MISCELLANEOUS) ×2 IMPLANT
BASKET ZERO TIP NITINOL 2.4FR (BASKET) IMPLANT
CATH INTERMIT  6FR 70CM (CATHETERS) IMPLANT
CLOTH BEACON ORANGE TIMEOUT ST (SAFETY) ×2 IMPLANT
COVER SURGICAL LIGHT HANDLE (MISCELLANEOUS) ×2 IMPLANT
FIBER LASER FLEXIVA 365 (UROLOGICAL SUPPLIES) IMPLANT
FIBER LASER TRAC TIP (UROLOGICAL SUPPLIES) IMPLANT
GLOVE BIOGEL M STRL SZ7.5 (GLOVE) ×2 IMPLANT
GOWN STRL REUS W/TWL LRG LVL3 (GOWN DISPOSABLE) ×4 IMPLANT
GUIDEWIRE ANG ZIPWIRE 038X150 (WIRE) IMPLANT
GUIDEWIRE STR DUAL SENSOR (WIRE) ×2 IMPLANT
IV NS 1000ML (IV SOLUTION) ×1
IV NS 1000ML BAXH (IV SOLUTION) ×1 IMPLANT
MANIFOLD NEPTUNE II (INSTRUMENTS) ×2 IMPLANT
PACK CYSTO (CUSTOM PROCEDURE TRAY) ×2 IMPLANT
SHEATH ACCESS URETERAL 38CM (SHEATH) IMPLANT
STENT URET 6FRX26 CONTOUR (STENTS) ×2 IMPLANT
TUBING CONNECTING 10 (TUBING) ×2 IMPLANT

## 2017-01-20 NOTE — ED Triage Notes (Signed)
Pt states he was seen here on the 12th and was diagnosed with kidney stones  Pt states he went to surgery and was discharged on Thursday afternoon  Pt states the pain started to get worse again on Friday and yesterday it was worse than on Friday  Pt has nausea without vomiting

## 2017-01-20 NOTE — Anesthesia Postprocedure Evaluation (Signed)
Anesthesia Post Note  Patient: Kevin ULYSSE  Procedure(s) Performed: Procedure(s) (LRB): CYSTOSCOPY WITH RETROGRADE PYELOGRAM, LEFT STENT PLACEMENT(STRING OFF) (Left)  Patient location during evaluation: PACU Anesthesia Type: General Level of consciousness: awake Pain management: pain level controlled Vital Signs Assessment: post-procedure vital signs reviewed and stable Respiratory status: spontaneous breathing Cardiovascular status: stable Anesthetic complications: no       Last Vitals:  Vitals:   01/20/17 1124 01/20/17 1330  BP: 123/88 126/79  Pulse: (!) 114 99  Resp: 20 14  Temp: (!) 40.3 C 37.4 C    Last Pain:  Vitals:   01/20/17 1124  TempSrc: Rectal  PainSc:                  Kacen Mellinger

## 2017-01-20 NOTE — Addendum Note (Signed)
Addendum  created 01/20/17 1416 by Cynda Familia, CRNA   Anesthesia Intra Meds edited

## 2017-01-20 NOTE — Transfer of Care (Signed)
Immediate Anesthesia Transfer of Care Note  Patient: Kevin [name]  Procedure(s) Performed: Procedure(s): CYSTOSCOPY WITH RETROGRADE PYELOGRAM, LEFT STENT PLACEMENT(STRING OFF) (Left)  Patient Location: PACU  Anesthesia Type:General  Level of Consciousness: sedated  Airway & Oxygen Therapy: Patient Spontanous Breathing and Patient connected to face mask oxygen  Post-op Assessment: Report given to RN and Post -op Vital signs reviewed and stable  Post vital signs: Reviewed and stable  Last Vitals:  Vitals:   01/20/17 1005 01/20/17 1124  BP: 135/80 123/88  Pulse: (!) 115 (!) 114  Resp: 20 20  Temp: 37.8 C (!) 40.3 C    Last Pain:  Vitals:   01/20/17 1124  TempSrc: Rectal  PainSc:       Patients Stated Pain Goal: 2 (37/09/64 3838)  Complications: No apparent anesthesia complications

## 2017-01-20 NOTE — ED Notes (Signed)
Our doctor (Cardama) and PA, Mia are performing u/s as I write this.

## 2017-01-20 NOTE — ED Notes (Signed)
I have just drawn two blood cultures after which I gave rocephin.

## 2017-01-20 NOTE — Consult Note (Signed)
Urology Consult   Physician requesting consult: Dr. Leonette Monarch  Reason for consult: Left flank pain and fever  History of Present Illness: Kevin [name] is a 61 y.o. who underwent left ureteroscopic treatment of a 4 mm left ureteral stone 4/12.  He had a stent with tether and his stent became dislodged yesterday and he removed it.  He subsequently developed worsening left flank pain and ran out of pain medication.  He presented to the ED this morning and his pain has been poorly controlled even with IV pain medication.  While in the ED he developed tachycardia and fever to 104 and generalized myalgias/malaise.   Past Medical History:  Diagnosis Date  . Hypertension   . 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 Prostate cancer [facility])     Past Surgical History:  Procedure Laterality Date  . CHOLECYSTECTOMY    . PROSTATE SURGERY    . receiving chronic interferon     . TONSILLECTOMY      Current Hospital Medications:  Home Meds:  Current Meds  Medication Sig  . acitretin (SORIATANE) 25 MG capsule Take 1 capsule by mouth daily.  Marland Kitchen amLODipine-valsartan (EXFORGE) 5-160 MG tablet Take 1 tablet by mouth daily.  . cephALEXin (KEFLEX) 500 MG capsule Take 1 capsule (500 mg total) by mouth 2 (two) times daily.  Marland Kitchen HYDROcodone-acetaminophen (NORCO) 5-325 MG tablet Take 1-2 tablets by mouth every 4 (four) hours as needed for moderate pain.  . INTRON A 6000000 UNIT/ML injection Inject 1 each as directed 3 (three) times a week.   . milk thistle 175 MG tablet Take 175 mg by mouth daily.  Marland Kitchen oxybutynin (DITROPAN) 5 MG tablet Take 1 tablet (5 mg total) by mouth every 8 (eight) hours as needed for bladder spasms.  Marland Kitchen psyllium (HYDROCIL/METAMUCIL) 95 % PACK Take 1 packet by mouth daily.  . Red Yeast Rice Extract 600 MG CAPS Take 2 capsules by mouth daily.     Scheduled Meds: . acetaminophen  650 mg Rectal Once  . furosemide  10 mg Intravenous Once   Continuous  Infusions: . cefTRIAXone (ROCEPHIN)  IV     PRN Meds:.  Allergies: No Known Allergies  Family History  Problem Relation Age of Onset  . Stroke Father   . Hypertension Father   . Head & neck cancer Father   . Hypertension Mother   . Skin cancer Mother   . Prostate cancer Paternal Uncle     prostate ca    Social History:  reports that he quit smoking about 2 years ago. His smoking use included Cigarettes. He quit after 10.00 years of use. He has never used smokeless tobacco. He reports that he drinks alcohol. He reports that he does not use drugs.  ROS: A complete review of systems was performed.  All systems are negative except for pertinent findings as noted.  Physical Exam:  Vital signs in last 24 hours: Temp:  [99.3 F (37.4 C)-104.5 F (40.3 C)] 104.5 F (40.3 C) (04/15 1124) Pulse Rate:  [86-115] 114 (04/15 1124) Resp:  [17-27] 20 (04/15 1124) BP: (123-160)/(80-99) 123/88 (04/15 1124) SpO2:  [85 %-94 %] 91 % (04/15 1124) Weight:  [90.7 kg (200 lb)] 90.7 kg (200 lb) (04/15 5277) Constitutional:  Alert and oriented, No acute distress Cardiovascular: Regular, tachycardic, No JVD Respiratory: Normal respiratory effort, Lungs clear bilaterally GI: Abdomen is soft, non-distended Lymphatic: No lymphadenopathy Neurologic: Grossly intact, no focal deficits Psychiatric: Normal mood and affect  Laboratory Data:   Recent Labs  01/20/17 0650  WBC 8.6  HGB 12.9*  HCT 37.2*  PLT 114*     Recent Labs  01/20/17 0650  NA 137  K 3.5  CL 107  GLUCOSE 105*  BUN 22*  CALCIUM 8.7*  CREATININE 1.72*     Results for orders placed or performed during the hospital encounter of 01/20/17 (from the past 24 hour(s))  Urinalysis, Routine w reflex microscopic- may I&O cath if menses     Status: Abnormal   Collection Time: 01/20/17  4:55 AM  Result Value Ref Range   Color, Urine AMBER (A) YELLOW   APPearance CLEAR CLEAR   Specific Gravity, Urine 1.021 1.005 - 1.030   pH  5.0 5.0 - 8.0   Glucose, UA NEGATIVE NEGATIVE mg/dL   Hgb urine dipstick LARGE (A) NEGATIVE   Bilirubin Urine NEGATIVE NEGATIVE   Ketones, ur 5 (A) NEGATIVE mg/dL   Protein, ur 30 (A) NEGATIVE mg/dL   Nitrite NEGATIVE NEGATIVE   Leukocytes, UA TRACE (A) NEGATIVE   RBC / HPF TOO NUMEROUS TO COUNT 0 - 5 RBC/hpf   WBC, UA 6-30 0 - 5 WBC/hpf   Bacteria, UA NONE SEEN NONE SEEN   Squamous Epithelial / LPF NONE SEEN NONE SEEN   Mucous PRESENT   CBC     Status: Abnormal   Collection Time: 01/20/17  6:50 AM  Result Value Ref Range   WBC 8.6 4.0 - 10.5 K/uL   RBC 4.11 (L) 4.22 - 5.81 MIL/uL   Hemoglobin 12.9 (L) 13.0 - 17.0 g/dL   HCT 37.2 (L) 39.0 - 52.0 %   MCV 90.5 78.0 - 100.0 fL   MCH 31.4 26.0 - 34.0 pg   MCHC 34.7 30.0 - 36.0 g/dL   RDW 14.0 11.5 - 15.5 %   Platelets 114 (L) 150 - 400 K/uL  Basic metabolic panel     Status: Abnormal   Collection Time: 01/20/17  6:50 AM  Result Value Ref Range   Sodium 137 135 - 145 mmol/L   Potassium 3.5 3.5 - 5.1 mmol/L   Chloride 107 101 - 111 mmol/L   CO2 22 22 - 32 mmol/L   Glucose, Bld 105 (H) 65 - 99 mg/dL   BUN 22 (H) 6 - 20 mg/dL   Creatinine, Ser 1.72 (H) 0.61 - 1.24 mg/dL   Calcium 8.7 (L) 8.9 - 10.3 mg/dL   GFR calc non Af Amer 42 (L) >60 mL/min   GFR calc Af Amer 49 (L) >60 mL/min   Anion gap 8 5 - 15   Recent Results (from the past 240 hour(s))  Urine culture     Status: Abnormal   Collection Time: 01/17/17  2:43 AM  Result Value Ref Range Status   Specimen Description URINE, CLEAN CATCH  Final   Special Requests NONE  Final   Culture >=100,000 COLONIES/mL ESCHERICHIA COLI (A)  Final   Report Status 01/19/2017 FINAL  Final   Organism ID, Bacteria ESCHERICHIA COLI (A)  Final      Susceptibility   Escherichia coli - MIC*    AMPICILLIN >=32 RESISTANT Resistant     CEFAZOLIN <=4 SENSITIVE Sensitive     CEFTRIAXONE <=1 SENSITIVE Sensitive     CIPROFLOXACIN <=0.25 SENSITIVE Sensitive     GENTAMICIN <=1 SENSITIVE Sensitive      IMIPENEM <=0.25 SENSITIVE Sensitive     NITROFURANTOIN <=16 SENSITIVE Sensitive     TRIMETH/SULFA <=20 SENSITIVE Sensitive  AMPICILLIN/SULBACTAM 16 INTERMEDIATE Intermediate     PIP/TAZO <=4 SENSITIVE Sensitive     Extended ESBL NEGATIVE Sensitive     * >=100,000 COLONIES/mL ESCHERICHIA COLI    Renal Function:  Recent Labs  01/17/17 0102 01/20/17 0650  CREATININE 1.39* 1.72*   Estimated Creatinine Clearance: 51.4 mL/min (A) (by C-G formula based on SCr of 1.72 mg/dL (H)).  Radiologic Imaging: Dg Chest 2 View  Result Date: 01/20/2017 CLINICAL DATA:  Weakness, chills and shortness of breath. Cystoscopy, left ureteral stone extraction and left urinary stent placement 3 days ago. EXAM: CHEST  2 VIEW COMPARISON:  11/26/2013 radiograph FINDINGS: This is a mildly low volume film. The cardiomediastinal silhouette is unremarkable. Pulmonary vascular congestion and trace bilateral pleural effusions are noted. Mild left basilar atelectasis is noted. There is no evidence of pneumothorax. No acute bony abnormalities are noted. IMPRESSION: Pulmonary vascular congestion, trace bilateral pleural effusions and mild left basilar atelectasis. Electronically Signed   By: Margarette Canada M.D.   On: 01/20/2017 10:26   Dg Abd 2 Views  Result Date: 01/20/2017 CLINICAL DATA:  Abdominal pain.  Status post lithotripsy. EXAM: ABDOMEN - 2 VIEW COMPARISON:  CT abdomen pelvis 01/17/2017 FINDINGS: Visualized lung bases are normal. There is no free intraperitoneal air. The visualized colon is largely gas-filled and distended. There is stool within the cecum and ascending colon. Prominent loop of gas-filled small bowel is noted in the left lower quadrant. Opacity overlying the right renal shadow likely corresponds to the 3 mm interpolar stone demonstrated on recent CT. No other radiopaque calculi. Specifically, no calculi along the course of the ureters. IMPRESSION: No radiographically evident ureteral calculus.  Calcification overlying the right renal shadow may correspond to the 3 mm interpolar stone seen on previous CT. Electronically Signed   By: Ulyses Jarred M.D.   On: 01/20/2017 07:00    I independently reviewed the above imaging studies.  Impression/Recommendation  1) Febrile UTI with developing sepsis:  Blood cultures drawn.  Started on ceftriaxone (E coli culture sensitive) and aggressive IVF hydration.  Will take to OR urgently for left ureteral stent placement for suspected left ureteral obstruction with fever and uncontrolled pain.  Will admit for continued monitoring and IV antibiotic therapy.  Chayse Zatarain,LES 01/20/2017, 12:02 PM    Pryor Curia. MD   CC: Dr. Leonette Monarch

## 2017-01-20 NOTE — ED Provider Notes (Signed)
[place] DEPT [facility] Provider Note   CSN: [id number] Arrival date & time: 01/20/17  0439     History   Chief Complaint Chief Complaint  Patient presents with  . Flank Pain    HPI CLARY MEEKER is a 61 y.o. male with a h/o of prostate CA s/p RRP who presents to the Emergency Department for worsening left flank pain that began last night and has continued to worsen. He describes the pain as achy, constant, and 10/10. He was seen on 4/12 and an obstructing 6x4 mm stone was noted at the proximal left ureter. He underwent uteroscopic treatment with stent placement and was discharged with pain medication. He reported he was feeling better and went to work 2 today ago, until the pain started to worsen last night. He also reports he ran out of pain medication yesterday following the procedures. He reports associated nausea and constipation. Last BM was 5 days ago. Denies vomiting, diarrhea, flatus, fever, chills, dyspnea, HA, dysuria, or hematuria.   The history is provided by the patient. No language interpreter was used.    Past Medical History:  Diagnosis Date  . Hypertension   . 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 Prostate cancer [facility])     Patient Active Problem List   Diagnosis Date Noted  . Ureteral obstruction, left 01/20/2017  . Calculus of ureter 01/17/2017  . Malignant neoplasm of prostate ([place]) 04/19/2016    Past Surgical History:  Procedure Laterality Date  . CHOLECYSTECTOMY    . CYSTOSCOPY W/ RETROGRADES Left 01/20/2017   Procedure: CYSTOSCOPY WITH RETROGRADE PYELOGRAM, LEFT STENT PLACEMENT(STRING OFF);  Surgeon: Raynelle Bring, MD;  Location: [facility];  Service: Urology;  Laterality: Left;  . CYSTOSCOPY WITH RETROGRADE PYELOGRAM, URETEROSCOPY AND STENT PLACEMENT Left 01/17/2017   Procedure: CYSTOSCOPY WITH RETROGRADE PYELOGRAM, URETEROSCOPY, HOLMIUM LASER AND  LEFT STENT PLACEMENT;  Surgeon: Franchot Gallo, MD;  Location: [facility];  Service: Urology;  Laterality: Left;  . PROSTATE SURGERY    . receiving chronic interferon     . TONSILLECTOMY    . URETERAL STENT PLACEMENT         Home Medications    Prior to Admission medications   Medication Sig Start Date End Date Taking? Authorizing Provider  acitretin (SORIATANE) 25 MG capsule Take 1 capsule by mouth daily. 12/11/16  Yes Historical Provider, MD  amLODipine-valsartan (EXFORGE) 5-160 MG tablet Take 1 tablet by mouth daily. 12/17/16  Yes Historical Provider, MD  cephALEXin (KEFLEX) 500 MG capsule Take 1 capsule (500 mg total) by mouth 2 (two) times daily. 01/17/17  Yes Franchot Gallo, MD  HYDROcodone-acetaminophen (NORCO) 5-325 MG tablet Take 1-2 tablets by mouth every 4 (four) hours as needed for moderate pain. 01/17/17  Yes Franchot Gallo, MD  INTRON A 6000000 UNIT/ML injection Inject 1 each as directed 3 (three) times a week.  01/14/17  Yes Historical Provider, MD  milk thistle 175 MG tablet Take 175 mg by mouth daily.   Yes Historical Provider, MD  oxybutynin (DITROPAN) 5 MG tablet Take 1 tablet (5 mg total) by mouth every 8 (eight) hours as needed for bladder spasms. 01/17/17  Yes Franchot Gallo, MD  psyllium (HYDROCIL/METAMUCIL) 95 % PACK Take 1 packet by mouth daily.   Yes Historical Provider, MD  Red Yeast Rice Extract 600 MG CAPS Take 2 capsules by mouth daily.    Yes Historical Provider, MD    Family History Family History  Problem Relation Age  of Onset  . Stroke Father   . Hypertension Father   . Head & neck cancer Father   . Hypertension Mother   . Skin cancer Mother   . Prostate cancer Paternal Uncle     prostate ca    Social History Social History  Substance Use Topics  . Smoking status: Former Smoker    Years: 10.00    Types: Cigarettes    Quit date: 10/08/2014  . Smokeless tobacco: Never Used  . Alcohol use Yes     Comment: 2-5 per day     Allergies   Patient has no known allergies.   Review of  Systems Review of Systems  Constitutional: Negative for chills and fever.  Respiratory: Negative for shortness of breath.   Cardiovascular: Negative for chest pain.  Gastrointestinal: Positive for abdominal pain, constipation and nausea. Negative for vomiting.  Genitourinary: Negative for dysuria and hematuria.  Skin: Negative for rash.  Neurological: Negative for headaches.  Psychiatric/Behavioral: Negative for confusion.     Physical Exam Updated Vital Signs BP 131/79 (BP Location: Right Arm)   Pulse (!) 103   Temp 98.9 F (37.2 C) (Axillary)   Resp 20   Ht 6' (1.829 m)   Wt 90.7 kg   SpO2 93%   BMI 27.12 kg/m   Physical Exam  Constitutional: He appears well-developed and well-nourished.  Uncomfortable appearing.  HENT:  Head: Normocephalic and atraumatic.  Eyes: Conjunctivae are normal.  Neck: Neck supple.  Cardiovascular: Normal rate and regular rhythm.   No murmur heard. Pulmonary/Chest: Effort normal. No respiratory distress.  Coarse breath sounds bilaterally.   Abdominal: Soft. Bowel sounds are normal. He exhibits distension. There is tenderness. There is guarding.  Exquisitely tender to palpation to the LUQ and LLQ with guarding.   Musculoskeletal: He exhibits no edema.  Neurological: He is alert.  Skin: Skin is warm and dry.  Psychiatric: His behavior is normal.  Nursing note and vitals reviewed.  ED Treatments / Results  Labs (all labs ordered are listed, but only abnormal results are displayed) Labs Reviewed  URINALYSIS, ROUTINE W REFLEX MICROSCOPIC - Abnormal; Notable for the following:       Result Value   Color, Urine AMBER (*)    Hgb urine dipstick LARGE (*)    Ketones, ur 5 (*)    Protein, ur 30 (*)    Leukocytes, UA TRACE (*)    All other components within normal limits  CBC - Abnormal; Notable for the following:    RBC 4.11 (*)    Hemoglobin 12.9 (*)    HCT 37.2 (*)    Platelets 114 (*)    All other components within normal limits   BASIC METABOLIC PANEL - Abnormal; Notable for the following:    Glucose, Bld 105 (*)    BUN 22 (*)    Creatinine, Ser 1.72 (*)    Calcium 8.7 (*)    GFR calc non Af Amer 42 (*)    GFR calc Af Amer 49 (*)    All other components within normal limits  CBC - Abnormal; Notable for the following:    WBC 11.9 (*)    RBC 3.85 (*)    Hemoglobin 11.4 (*)    HCT 34.3 (*)    Platelets 107 (*)    All other components within normal limits  BASIC METABOLIC PANEL - Abnormal; Notable for the following:    Calcium 8.1 (*)    All other components within normal limits  CULTURE,  BLOOD (ROUTINE X 2)  CULTURE, BLOOD (ROUTINE X 2)  C DIFFICILE QUICK SCREEN W PCR REFLEX  CBC  I-STAT CG4 LACTIC ACID, ED    EKG  EKG Interpretation None       Radiology Dg Chest 2 View  Result Date: 01/20/2017 CLINICAL DATA:  Weakness, chills and shortness of breath. Cystoscopy, left ureteral stone extraction and left urinary stent placement 3 days ago. EXAM: CHEST  2 VIEW COMPARISON:  11/26/2013 radiograph FINDINGS: This is a mildly low volume film. The cardiomediastinal silhouette is unremarkable. Pulmonary vascular congestion and trace bilateral pleural effusions are noted. Mild left basilar atelectasis is noted. There is no evidence of pneumothorax. No acute bony abnormalities are noted. IMPRESSION: Pulmonary vascular congestion, trace bilateral pleural effusions and mild left basilar atelectasis. Electronically Signed   By: Margarette Canada M.D.   On: 01/20/2017 10:26   Dg Abd 2 Views  Result Date: 01/20/2017 CLINICAL DATA:  Abdominal pain.  Status post lithotripsy. EXAM: ABDOMEN - 2 VIEW COMPARISON:  CT abdomen pelvis 01/17/2017 FINDINGS: Visualized lung bases are normal. There is no free intraperitoneal air. The visualized colon is largely gas-filled and distended. There is stool within the cecum and ascending colon. Prominent loop of gas-filled small bowel is noted in the left lower quadrant. Opacity overlying the  right renal shadow likely corresponds to the 3 mm interpolar stone demonstrated on recent CT. No other radiopaque calculi. Specifically, no calculi along the course of the ureters. IMPRESSION: No radiographically evident ureteral calculus. Calcification overlying the right renal shadow may correspond to the 3 mm interpolar stone seen on previous CT. Electronically Signed   By: Ulyses Jarred M.D.   On: 01/20/2017 07:00   Dg C-arm 1-60 Min-no Report  Result Date: 01/20/2017 Fluoroscopy was utilized by the requesting physician.  No radiographic interpretation.    Procedures Procedures (including critical care time)  EMERGENCY DEPARTMENT US RENAL EXAM  "Study: Limited Retroperitoneal Ultrasound of Kidneys"  INDICATIONS: h/o of recent nephrolithiasis Long and short axis of both kidneys were obtained.   PERFORMED BY: Myself and Dr. Leonette Monarch IMAGES ARCHIVED?: Yes LIMITATIONS: None VIEWS USED: Long axis and short axix INTERPRETATION: Left  Hydronephrosis mild   CRITICAL CARE NOTE: Critical care time was provided for 60 minutes exclusive of separately billable procedures and treating other patients.  This was necessary to treat or prevent further deterioration of the following condition(s) sepsis which the patient had and/or had a high probability of suddenly developing. This involved direct bedside patient care, speaking with family members, review of past medical records, reviewing the results of the laboratory and diagnostic studies, consulting with other physicians, as well as evaluating the effectiveness of the therapy instituted as described.  Medications Ordered in ED Medications  acitretin (SORIATANE) capsule 25 mg (25 mg Oral Not Given 01/21/17 0951)  lactated ringers infusion ( Intravenous Stopped 01/21/17 0950)  cefTRIAXone (ROCEPHIN) 1 g in dextrose 5 % 50 mL IVPB (1 g Intravenous Given 01/21/17 1218)  HYDROcodone-acetaminophen (NORCO/VICODIN) 5-325 MG per tablet 1-2 tablet (2 tablets  Oral Given 01/21/17 2029)  HYDROmorphone (DILAUDID) injection 0.5-1 mg (1 mg Intravenous Given 01/21/17 1333)  zolpidem (AMBIEN) tablet 5 mg (not administered)  diphenhydrAMINE (BENADRYL) injection 12.5 mg (not administered)    Or  diphenhydrAMINE (BENADRYL) 12.5 MG/5ML elixir 12.5 mg (not administered)  opium-belladonna (B&O SUPPRETTES) 16.2-60 MG suppository 1 suppository (1 suppository Rectal Given 01/20/17 2031)  acetaminophen (TYLENOL) tablet 650 mg (650 mg Oral Given 01/21/17 1218)  bisacodyl (DULCOLAX) suppository 10 mg (  10 mg Rectal Given 01/21/17 2243)  0.45 % sodium chloride infusion ( Intravenous New Bag/Given 01/21/17 2246)  saccharomyces boulardii (FLORASTOR) capsule 250 mg (250 mg Oral Given 01/21/17 2243)  enoxaparin (LOVENOX) injection 40 mg (40 mg Subcutaneous Given 01/21/17 2243)  HYDROmorphone (DILAUDID) injection 1 mg (1 mg Intravenous Given 01/20/17 0510)  ondansetron (ZOFRAN) injection 4 mg (4 mg Intravenous Given 01/20/17 0510)  HYDROmorphone (DILAUDID) injection 1 mg (1 mg Intravenous Given 01/20/17 0729)  sodium chloride 0.9 % bolus 1,000 mL (0 mLs Intravenous Stopped 01/20/17 1106)  HYDROmorphone (DILAUDID) injection 1 mg (1 mg Intravenous Given 01/20/17 0920)  HYDROmorphone (DILAUDID) injection 1 mg (1 mg Intravenous Given 01/20/17 1141)  acetaminophen (TYLENOL) suppository 650 mg (650 mg Rectal Given 01/20/17 1225)     Initial Impression / Assessment and Plan / ED Course  I have reviewed the triage vital signs and the nursing notes.  Pertinent labs & imaging results that were available during my care of the patient were reviewed by me and considered in my medical decision making (see chart for details).     - 10:05 Rectal temp of 100 - 11:24 Repeat rectal temp. Now 104.5.   61 year old male who presented for evaluation of left flank pain after undergoing ureteroscopic treatment with stent placement on 4/12 for an obstructing kidney stone. Discussed the patient with Dr.  Dallas Breeding. Consulted Dr. Alinda Money with urology who recommended if his pain could be controlled. Gave 3 mg of Dilaudid in the ED without success. Cr bumped to 1.72 from 1.39 on 4/12. UA essentially unchanged from 4/12. Patient became increasingly febrile in the ED (rectal temp from 100 to 104.5), tachycardic with decreased O2 sats. Spoke with Dr. Alinda Money who recommended taking the patient to the OR followed by admission , Lasix for acute pulmonary congestion seen on CXR, and ceftiraxone for urine culture from 4/12.  Final Clinical Impressions(s) / ED Diagnoses   Final diagnoses:  Abdominal pain  Ureteral obstruction, left    New Prescriptions Current Discharge Medication List       Joanne Gavel, PA-C 01/21/17 [place], MD 01/23/17 [phone]

## 2017-01-20 NOTE — Anesthesia Procedure Notes (Signed)
Procedure Name: LMA Insertion Date/Time: 01/20/2017 12:59 PM Performed by: Cynda Familia Pre-anesthesia Checklist: Patient identified, Emergency Drugs available, Suction available and Patient being monitored Patient Re-evaluated:Patient Re-evaluated prior to inductionOxygen Delivery Method: Circle System Utilized Preoxygenation: Pre-oxygenation with 100% oxygen Intubation Type: IV induction Ventilation: Mask ventilation without difficulty LMA: LMA inserted LMA Size: 4.0 Number of attempts: 1 Placement Confirmation: positive ETCO2 Tube secured with: Tape Dental Injury: Teeth and Oropharynx as per pre-operative assessment  Comments: Smooth IV induction Green--- LMA AM CRNA--- atraumatic--- teeth and mouth as preop--- bilat BS

## 2017-01-20 NOTE — Anesthesia Preprocedure Evaluation (Addendum)
Anesthesia Evaluation  Patient identified by MRN, date of birth, ID band Patient awake    Reviewed: Allergy & Precautions, NPO status , reviewed documented beta blocker date and time   Airway Mallampati: II  TM Distance: >3 FB     Dental  (+) Dental Advisory Given   Pulmonary former smoker,    breath sounds clear to auscultation       Cardiovascular hypertension,  Rhythm:Regular     Neuro/Psych    GI/Hepatic negative GI ROS, Neg liver ROS,   Endo/Other  negative endocrine ROS  Renal/GU negative Renal ROS     Musculoskeletal   Abdominal   Peds  Hematology   Anesthesia Other Findings   Reproductive/Obstetrics                            Anesthesia Physical Anesthesia Plan  ASA: III and emergent  Anesthesia Plan: General   Post-op Pain Management:    Induction: Intravenous  Airway Management Planned: LMA  Additional Equipment:   Intra-op Plan:   Post-operative Plan: Extubation in OR  Informed Consent: I have reviewed the patients History and Physical, chart, labs and discussed the procedure including the risks, benefits and alternatives for the proposed anesthesia with the patient or authorized representative who has indicated his/her understanding and acceptance.   Dental advisory given  Plan Discussed with: CRNA, Anesthesiologist and Surgeon  Anesthesia Plan Comments:       Anesthesia Quick Evaluation

## 2017-01-20 NOTE — Op Note (Signed)
  Preoperative diagnosis:  1. Febrile UTI 2. Left ureteral obstruction   Postoperative diagnosis:  1. Febrile UTI 2. Left ureteral obstruction   Procedure:  1. Cystoscopy 2. Left ureteral stent placement (6 x 26) no string 3. Left retrograde pyelography with interpretation   Surgeon: Pryor Curia. M.D.  Anesthesia: General  Complications: None  Intraoperative findings: Left retrograde pyelogram was performed with omnipaque contrast and revealed no dilation of the ureter or filling defects.  The entire ureter nor the renal collecting system were evaluated so as not to create increased pressures in the renal collecting system.  EBL: Minimal  Specimens: None  Indication: Kevin [name] is a 61 y.o. patient with left ureteral obstruction likely related to ureteral edema s/p recent ureteroscopic stone management and fever. After reviewing the management options for treatment, he elected to proceed with the above surgical procedure(s). We have discussed the potential benefits and risks of the procedure, side effects of the proposed treatment, the likelihood of the patient achieving the goals of the procedure, and any potential problems that might occur during the procedure or recuperation. Informed consent has been obtained.  Description of procedure:  The patient was taken to the operating room and general anesthesia was induced.  The patient was placed in the dorsal lithotomy position, prepped and draped in the usual sterile fashion, and preoperative antibiotics were administered. A preoperative time-out was performed.   Cystourethroscopy was performed.  The patient's urethra was examined and was normal. The bladder was then systematically examined in its entirety. There was no evidence for any bladder tumors, stones, or other mucosal pathology.    Attention then turned to the left ureteral orifice.  Initial attempts to place a wire into the left ureter were met with resistance  and a ureteral catheter was used to intubate the ureteral orifice.  Omnipaque contrast was injected through the ureteral catheter and a retrograde pyelogram was performed with findings as dictated above.  A 0.38 sensor guidewire was then advanced up the left ureter into the renal pelvis under fluoroscopic guidance.  The wire was then backloaded through the cystoscope and a ureteral stent was advance over the wire using Seldinger technique.  The stent was positioned appropriately under fluoroscopic and cystoscopic guidance.  The wire was then removed with an adequate stent curl noted in the renal pelvis as well as in the bladder.  The bladder was then emptied and the procedure ended.  The patient appeared to tolerate the procedure well and without complications.  The patient was able to be awakened and transferred to the recovery unit in satisfactory condition.    Pryor Curia MD

## 2017-01-21 ENCOUNTER — Encounter (HOSPITAL_BASED_OUTPATIENT_CLINIC_OR_DEPARTMENT_OTHER): Payer: Self-pay | Admitting: Urology

## 2017-01-21 DIAGNOSIS — R1032 Left lower quadrant pain: Secondary | ICD-10-CM | POA: Diagnosis not present

## 2017-01-21 DIAGNOSIS — N135 Crossing vessel and stricture of ureter without hydronephrosis: Secondary | ICD-10-CM | POA: Diagnosis not present

## 2017-01-21 LAB — BASIC METABOLIC PANEL
Anion gap: 9 (ref 5–15)
BUN: 15 mg/dL (ref 6–20)
CALCIUM: 8.1 mg/dL — AB (ref 8.9–10.3)
CO2: 24 mmol/L (ref 22–32)
CREATININE: 1.24 mg/dL (ref 0.61–1.24)
Chloride: 104 mmol/L (ref 101–111)
GFR calc Af Amer: >60 mL/min (ref >60)
Glucose, Bld: 98 mg/dL (ref 65–99)
Potassium: 3.6 mmol/L (ref 3.5–5.1)
SODIUM: 137 mmol/L (ref 135–145)

## 2017-01-21 LAB — CBC
HCT: 34.3 % — ABNORMAL LOW (ref 39.0–52.0)
Hemoglobin: 11.4 g/dL — ABNORMAL LOW (ref 13.0–17.0)
MCH: 29.6 pg (ref 26.0–34.0)
MCHC: 33.2 g/dL (ref 30.0–36.0)
MCV: 89.1 fL (ref 78.0–100.0)
Platelets: 107 10*3/uL — ABNORMAL LOW (ref 150–400)
RBC: 3.85 MIL/uL — ABNORMAL LOW (ref 4.22–5.81)
RDW: 14.2 % (ref 11.5–15.5)
WBC: 11.9 10*3/uL — ABNORMAL HIGH (ref 4.0–10.5)

## 2017-01-21 MED ORDER — SACCHAROMYCES BOULARDII 250 MG PO CAPS
250.0000 mg | ORAL_CAPSULE | Freq: Two times a day (BID) | ORAL | Status: DC
  Administered 2017-01-21 – 2017-01-24 (×7): 250 mg via ORAL
  Filled 2017-01-21 (×7): qty 1

## 2017-01-21 MED ORDER — SODIUM CHLORIDE 0.45 % IV SOLN
INTRAVENOUS | Status: DC
  Administered 2017-01-21 – 2017-01-23 (×4): via INTRAVENOUS

## 2017-01-21 MED ORDER — BISACODYL 10 MG RE SUPP
10.0000 mg | Freq: Two times a day (BID) | RECTAL | Status: DC | PRN
  Administered 2017-01-21: 10 mg via RECTAL
  Filled 2017-01-21: qty 1

## 2017-01-21 MED ORDER — ENOXAPARIN SODIUM 40 MG/0.4ML ~~LOC~~ SOLN
40.0000 mg | SUBCUTANEOUS | Status: DC
  Administered 2017-01-21 – 2017-01-24 (×4): 40 mg via SUBCUTANEOUS
  Filled 2017-01-21 (×4): qty 0.4

## 2017-01-21 MED ORDER — ACETAMINOPHEN 325 MG PO TABS
650.0000 mg | ORAL_TABLET | ORAL | Status: DC | PRN
  Administered 2017-01-21 – 2017-01-25 (×6): 650 mg via ORAL
  Filled 2017-01-21 (×6): qty 2

## 2017-01-21 NOTE — Progress Notes (Signed)
1 Day Post-Op Subjective: Patient reports feeling bloated. No BM x 6 days. + Flatus  Objective: Vital signs in last 24 hours: Temp:  [98.4 F (36.9 C)-104.5 F (40.3 C)] 101.4 F (38.6 C) (04/16 0754) Pulse Rate:  [99-115] 109 (04/16 0551) Resp:  [12-26] 21 (04/16 0551) BP: (108-155)/(74-97) 139/91 (04/16 0551) SpO2:  [90 %-100 %] 97 % (04/16 0551)  Intake/Output from previous day: 04/15 0701 - 04/16 0700 In: 3648.8 [P.O.:480; I.V.:3168.8] Out: 2 [Urine:2] Intake/Output this shift: No intake/output data recorded.  Physical Exam:  Constitutional: Vital signs reviewed. WD WN in NAD   Eyes: PERRL, No scleral icterus.   Cardiovascular: RRR Pulmonary/Chest: Normal effort Abdominal: Distended, tympanitic. Genitourinary: L>R testicular tenderness, scrotal edema/erythema.   Lab Results:  Recent Labs  01/20/17 0650 01/21/17 0436  HGB 12.9* 11.4*  HCT 37.2* 34.3*   BMET  Recent Labs  01/20/17 0650 01/21/17 0436  NA 137 137  K 3.5 3.6  CL 107 104  CO2 22 24  GLUCOSE 105* 98  BUN 22* 15  CREATININE 1.72* 1.24  CALCIUM 8.7* 8.1*   No results for input(s): LABPT, INR in the last 72 hours. No results for input(s): LABURIN in the last 72 hours. Results for orders placed or performed during the hospital encounter of 01/17/17  Urine culture     Status: Abnormal   Collection Time: 01/17/17  2:43 AM  Result Value Ref Range Status   Specimen Description URINE, CLEAN CATCH  Final   Special Requests NONE  Final   Culture >=100,000 COLONIES/mL ESCHERICHIA COLI (A)  Final   Report Status 01/19/2017 FINAL  Final   Organism ID, Bacteria ESCHERICHIA COLI (A)  Final      Susceptibility   Escherichia coli - MIC*    AMPICILLIN >=32 RESISTANT Resistant     CEFAZOLIN <=4 SENSITIVE Sensitive     CEFTRIAXONE <=1 SENSITIVE Sensitive     CIPROFLOXACIN <=0.25 SENSITIVE Sensitive     GENTAMICIN <=1 SENSITIVE Sensitive     IMIPENEM <=0.25 SENSITIVE Sensitive     NITROFURANTOIN <=16  SENSITIVE Sensitive     TRIMETH/SULFA <=20 SENSITIVE Sensitive     AMPICILLIN/SULBACTAM 16 INTERMEDIATE Intermediate     PIP/TAZO <=4 SENSITIVE Sensitive     Extended ESBL NEGATIVE Sensitive     * >=100,000 COLONIES/mL ESCHERICHIA COLI    Studies/Results: Dg Chest 2 View  Result Date: 01/20/2017 CLINICAL DATA:  Weakness, chills and shortness of breath. Cystoscopy, left ureteral stone extraction and left urinary stent placement 3 days ago. EXAM: CHEST  2 VIEW COMPARISON:  11/26/2013 radiograph FINDINGS: This is a mildly low volume film. The cardiomediastinal silhouette is unremarkable. Pulmonary vascular congestion and trace bilateral pleural effusions are noted. Mild left basilar atelectasis is noted. There is no evidence of pneumothorax. No acute bony abnormalities are noted. IMPRESSION: Pulmonary vascular congestion, trace bilateral pleural effusions and mild left basilar atelectasis. Electronically Signed   By: Margarette Canada M.D.   On: 01/20/2017 10:26   Dg Abd 2 Views  Result Date: 01/20/2017 CLINICAL DATA:  Abdominal pain.  Status post lithotripsy. EXAM: ABDOMEN - 2 VIEW COMPARISON:  CT abdomen pelvis 01/17/2017 FINDINGS: Visualized lung bases are normal. There is no free intraperitoneal air. The visualized colon is largely gas-filled and distended. There is stool within the cecum and ascending colon. Prominent loop of gas-filled small bowel is noted in the left lower quadrant. Opacity overlying the right renal shadow likely corresponds to the 3 mm interpolar stone demonstrated on recent CT. No  other radiopaque calculi. Specifically, no calculi along the course of the ureters. IMPRESSION: No radiographically evident ureteral calculus. Calcification overlying the right renal shadow may correspond to the 3 mm interpolar stone seen on previous CT. Electronically Signed   By: Ulyses Jarred M.D.   On: 01/20/2017 07:00   Dg C-arm 1-60 Min-no Report  Result Date: 01/20/2017 Fluoroscopy was utilized by  the requesting physician.  No radiographic interpretation.    Assessment/Plan:   S/P URS 4 days ago w/ migrated stent, s/p replacement w/ fever, UTI/poss orchitis. Still w/ significant fever. On rocephin w/ lower temp.  Cont rocephin, comfort measures for scrotal infection   LOS: 0 days   Franchot Gallo M 01/21/2017, 9:03 AM

## 2017-01-21 NOTE — Progress Notes (Signed)
ANTICOAGULATION CONSULT NOTE - Initial Consult  Pharmacy Consult for Enoxaparin Indication: VTE prophylaxis  No Known Allergies  Patient Measurements: Height: 6' (182.9 cm) Weight: 200 lb (90.7 kg) IBW/kg (Calculated) : 77.6  Vital Signs: Temp: 98.1 F (36.7 C) (04/16 1600) Temp Source: Oral (04/16 1600) BP: 127/77 (04/16 1600) Pulse Rate: 98 (04/16 1600)  Labs:  Recent Labs  01/20/17 0650 01/21/17 0436  HGB 12.9* 11.4*  HCT 37.2* 34.3*  PLT 114* 107*  CREATININE 1.72* 1.24    Estimated Creatinine Clearance: 71.3 mL/min (by C-G formula based on SCr of 1.24 mg/dL).   Medical History: Past Medical History:  Diagnosis Date  . Hypertension   . 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 Prostate cancer [facility])     Assessment: 40 y/oM s/p URS on 01/17/17 admitted on 01/20/17 with left flank pain, febrile UTI. Patient taken urgently to OR for suspected left ureteral obstruction,underwent left ureteral stent placement. Pharmacy consulted today to dose Enoxaparin for VTE prophylaxis. Patient not on any anticoagulants PTA. CBC reveals Hgb 11.4, Pltc 107K (trending down).   Goal of Therapy:  Prevention of VTE Monitor platelets by anticoagulation protocol: Yes   Plan:  Enoxaparin 40mg  SQ q24h. Monitor CBC closely (Pltc trend, Hgb). Monitor for bleeding.   Lindell Spar, PharmD, BCPS Pager: [phone] 01/21/2017 9:03 PM

## 2017-01-22 ENCOUNTER — Observation Stay (HOSPITAL_COMMUNITY): Payer: BLUE CROSS/BLUE SHIELD

## 2017-01-22 DIAGNOSIS — N135 Crossing vessel and stricture of ureter without hydronephrosis: Secondary | ICD-10-CM | POA: Diagnosis not present

## 2017-01-22 DIAGNOSIS — R109 Unspecified abdominal pain: Secondary | ICD-10-CM | POA: Diagnosis not present

## 2017-01-22 DIAGNOSIS — R1032 Left lower quadrant pain: Secondary | ICD-10-CM | POA: Diagnosis not present

## 2017-01-22 LAB — C DIFFICILE QUICK SCREEN W PCR REFLEX
C DIFFICILE (CDIFF) TOXIN: NEGATIVE
C Diff antigen: NEGATIVE
C Diff interpretation: NOT DETECTED

## 2017-01-22 LAB — CBC
HCT: 32.9 % — ABNORMAL LOW (ref 39.0–52.0)
Hemoglobin: 11.3 g/dL — ABNORMAL LOW (ref 13.0–17.0)
MCH: 31 pg (ref 26.0–34.0)
MCHC: 34.3 g/dL (ref 30.0–36.0)
MCV: 90.4 fL (ref 78.0–100.0)
PLATELETS: 125 10*3/uL — AB (ref 150–400)
RBC: 3.64 MIL/uL — ABNORMAL LOW (ref 4.22–5.81)
RDW: 14.1 % (ref 11.5–15.5)
WBC: 12.9 10*3/uL — AB (ref 4.0–10.5)

## 2017-01-22 MED ORDER — ACITRETIN 25 MG PO CAPS
25.0000 mg | ORAL_CAPSULE | Freq: Every day | ORAL | Status: DC
  Administered 2017-01-22 – 2017-01-23 (×2): 25 mg via ORAL

## 2017-01-22 MED ORDER — HYDROMORPHONE HCL 1 MG/ML IJ SOLN
0.5000 mg | INTRAMUSCULAR | Status: DC | PRN
  Administered 2017-01-22: 1 mg via INTRAVENOUS
  Filled 2017-01-22: qty 1

## 2017-01-22 MED ORDER — FLEET ENEMA 7-19 GM/118ML RE ENEM
1.0000 | ENEMA | Freq: Every day | RECTAL | Status: DC | PRN
  Administered 2017-01-22: 1 via RECTAL
  Filled 2017-01-22: qty 1

## 2017-01-22 MED ORDER — POLYETHYLENE GLYCOL 3350 17 G PO PACK: 17.0000 g | PACK | Freq: Every day | ORAL | Status: DC | PRN

## 2017-01-22 MED ORDER — KETOROLAC TROMETHAMINE 15 MG/ML IJ SOLN
15.0000 mg | Freq: Four times a day (QID) | INTRAMUSCULAR | Status: AC
  Administered 2017-01-22 – 2017-01-23 (×4): 15 mg via INTRAVENOUS
  Filled 2017-01-22 (×4): qty 1

## 2017-01-22 NOTE — Progress Notes (Signed)
Patient ID: Kevin [name], male   DOB: 1955-11-12, 61 y.o.   MRN: [id number]  2 Days Post-Op Subjective: Pt still feeling poorly.  Fever curve improving.  He did have a liquid BM and C. Diff was negative.  Still bloated.  Still with pain in left flank and lower abdomen although better compared to prior to stent.  Still with SOB.  Objective: Vital signs in last 24 hours: Temp:  [98.1 F (36.7 C)-98.9 F (37.2 C)] 98.3 F (36.8 C) (04/17 0616) Pulse Rate:  [85-113] 85 (04/17 0616) Resp:  [16-22] 19 (04/17 0616) BP: (112-138)/(71-89) 138/89 (04/17 0616) SpO2:  [90 %-95 %] 93 % (04/16 2119)  Intake/Output from previous day: 04/16 0701 - 04/17 0700 In: 2952.9 [P.O.:960; I.V.:1942.9; IV Piggyback:50] Out: -  Intake/Output this shift: No intake/output data recorded.  Physical Exam:  General: Alert and oriented CV: RRR Lungs: Clear Abdomen: Soft, Moderate distention, L CVAT GU: Scrotum with mild erythema and edema but no fluctuation or induration Ext: NT, No erythema  Lab Results:  Recent Labs  01/20/17 0650 01/21/17 0436 01/22/17 0352  HGB 12.9* 11.4* 11.3*  HCT 37.2* 34.3* 32.9*   CBC Latest Ref Rng & Units 01/22/2017 01/21/2017 01/20/2017  WBC 4.0 - 10.5 K/uL 12.9(H) 11.9(H) 8.6  Hemoglobin 13.0 - 17.0 g/dL 11.3(L) 11.4(L) 12.9(L)  Hematocrit 39.0 - 52.0 % 32.9(L) 34.3(L) 37.2(L)  Platelets 150 - 400 K/uL 125(L) 107(L) 114(L)     BMET  Recent Labs  01/20/17 0650 01/21/17 0436  NA 137 137  K 3.5 3.6  CL 107 104  CO2 22 24  GLUCOSE 105* 98  BUN 22* 15  CREATININE 1.72* 1.24  CALCIUM 8.7* 8.1*     Studies/Results:   Assessment/Plan: 1) Febrile UTI: Continue ceftriaxone.  Fever curve improving.  WBC higher today, continue to monitor. 2) SOB: Check CXR today. 3) Abdominal distention: Dulcolax suppository, ambulation.  Unclear reason for what appears to be ileus except maybe due to narcotic medication.    LOS: 0 days   Billi Bright,LES 01/22/2017, 8:37 AM

## 2017-01-23 ENCOUNTER — Observation Stay (HOSPITAL_COMMUNITY): Payer: BLUE CROSS/BLUE SHIELD

## 2017-01-23 DIAGNOSIS — N453 Epididymo-orchitis: Secondary | ICD-10-CM | POA: Diagnosis present

## 2017-01-23 DIAGNOSIS — Z8572 Personal history of non-Hodgkin lymphomas: Secondary | ICD-10-CM | POA: Diagnosis not present

## 2017-01-23 DIAGNOSIS — Z79899 Other long term (current) drug therapy: Secondary | ICD-10-CM | POA: Diagnosis not present

## 2017-01-23 DIAGNOSIS — T8389XA Other specified complication of genitourinary prosthetic devices, implants and grafts, initial encounter: Secondary | ICD-10-CM | POA: Diagnosis present

## 2017-01-23 DIAGNOSIS — N136 Pyonephrosis: Secondary | ICD-10-CM | POA: Diagnosis present

## 2017-01-23 DIAGNOSIS — R509 Fever, unspecified: Secondary | ICD-10-CM | POA: Diagnosis not present

## 2017-01-23 DIAGNOSIS — Z87891 Personal history of nicotine dependence: Secondary | ICD-10-CM | POA: Diagnosis not present

## 2017-01-23 DIAGNOSIS — B9620 Unspecified Escherichia coli [E. coli] as the cause of diseases classified elsewhere: Secondary | ICD-10-CM | POA: Diagnosis present

## 2017-01-23 DIAGNOSIS — Z87442 Personal history of urinary calculi: Secondary | ICD-10-CM | POA: Diagnosis not present

## 2017-01-23 DIAGNOSIS — R000 Tachycardia, unspecified: Secondary | ICD-10-CM | POA: Diagnosis present

## 2017-01-23 DIAGNOSIS — K567 Ileus, unspecified: Secondary | ICD-10-CM | POA: Diagnosis present

## 2017-01-23 DIAGNOSIS — Y828 Other medical devices associated with adverse incidents: Secondary | ICD-10-CM | POA: Diagnosis present

## 2017-01-23 DIAGNOSIS — N135 Crossing vessel and stricture of ureter without hydronephrosis: Secondary | ICD-10-CM | POA: Diagnosis not present

## 2017-01-23 DIAGNOSIS — I10 Essential (primary) hypertension: Secondary | ICD-10-CM | POA: Diagnosis present

## 2017-01-23 DIAGNOSIS — R1032 Left lower quadrant pain: Secondary | ICD-10-CM | POA: Diagnosis not present

## 2017-01-23 DIAGNOSIS — N5089 Other specified disorders of the male genital organs: Secondary | ICD-10-CM | POA: Diagnosis present

## 2017-01-23 DIAGNOSIS — Z8546 Personal history of malignant neoplasm of prostate: Secondary | ICD-10-CM | POA: Diagnosis not present

## 2017-01-23 DIAGNOSIS — R109 Unspecified abdominal pain: Secondary | ICD-10-CM | POA: Diagnosis present

## 2017-01-23 DIAGNOSIS — Z8249 Family history of ischemic heart disease and other diseases of the circulatory system: Secondary | ICD-10-CM | POA: Diagnosis not present

## 2017-01-23 DIAGNOSIS — N492 Inflammatory disorders of scrotum: Secondary | ICD-10-CM | POA: Diagnosis present

## 2017-01-23 LAB — CBC
HCT: 32.7 % — ABNORMAL LOW (ref 39.0–52.0)
Hemoglobin: 11.2 g/dL — ABNORMAL LOW (ref 13.0–17.0)
MCH: 30.6 pg (ref 26.0–34.0)
MCHC: 34.3 g/dL (ref 30.0–36.0)
MCV: 89.3 fL (ref 78.0–100.0)
PLATELETS: 165 10*3/uL (ref 150–400)
RBC: 3.66 MIL/uL — ABNORMAL LOW (ref 4.22–5.81)
RDW: 14.2 % (ref 11.5–15.5)
WBC: 11.5 10*3/uL — ABNORMAL HIGH (ref 4.0–10.5)

## 2017-01-23 MED ORDER — MAGIC MOUTHWASH
5.0000 mL | Freq: Three times a day (TID) | ORAL | Status: DC
  Administered 2017-01-23 – 2017-01-24 (×4): 5 mL via ORAL
  Filled 2017-01-23 (×5): qty 5

## 2017-01-23 MED ORDER — PIPERACILLIN-TAZOBACTAM 3.375 G IVPB
3.3750 g | Freq: Three times a day (TID) | INTRAVENOUS | Status: DC
  Administered 2017-01-23 – 2017-01-24 (×3): 3.375 g via INTRAVENOUS
  Filled 2017-01-23 (×4): qty 50

## 2017-01-23 NOTE — Progress Notes (Signed)
3 Days Post-Op Subjective: Patient reports feeling better after BM. Still feels worn out. Not doing well w/ solids yet  Objective: Vital signs in last 24 hours: Temp:  [99.6 F (37.6 C)-100.7 F (38.2 C)] 99.8 F (37.7 C) (04/18 0500) Pulse Rate:  [82-90] 82 (04/18 0500) Resp:  [18] 18 (04/17 1304) BP: (136-142)/(81-91) 142/91 (04/18 0500) SpO2:  [93 %-97 %] 97 % (04/18 0500)  Intake/Output from previous day: 04/17 0701 - 04/18 0700 In: 2276 [P.O.:480; I.V.:1746; IV Piggyback:50] Out: -  Intake/Output this shift: Total I/O In: 921 [I.V.:921] Out: -   Physical Exam:  Constitutional: Vital signs reviewed. WD WN in NAD   Eyes: PERRL, No scleral icterus.   Cardiovascular: RRR Pulmonary/Chest: Normal effort Abdominal: Soft.  Less distended.  Minimally tender. Genitourinary: Less scrotal edema/erythema.  Left testicle still somewhat tender.   Lab Results:  Recent Labs  01/21/17 0436 01/22/17 0352 01/23/17 0519  HGB 11.4* 11.3* 11.2*  HCT 34.3* 32.9* 32.7*   BMET  Recent Labs  01/20/17 0650 01/21/17 0436  NA 137 137  K 3.5 3.6  CL 107 104  CO2 22 24  GLUCOSE 105* 98  BUN 22* 15  CREATININE 1.72* 1.24  CALCIUM 8.7* 8.1*   No results for input(s): LABPT, INR in the last 72 hours. No results for input(s): LABURIN in the last 72 hours. Results for orders placed or performed during the hospital encounter of 01/20/17  Blood culture (routine x 2)     Status: None (Preliminary result)   Collection Time: 01/20/17 12:15 PM  Result Value Ref Range Status   Specimen Description BLOOD BLOOD LEFT FOREARM  Final   Special Requests   Final    BOTTLES DRAWN AEROBIC AND ANAEROBIC Blood Culture adequate volume   Culture   Final    NO GROWTH 2 DAYS Performed at [facility], [place] [address] [place], [place] [address]    Report Status PENDING  Incomplete  Blood culture (routine x 2)     Status: None (Preliminary result)   Collection Time: 01/20/17 12:15 PM  Result  Value Ref Range Status   Specimen Description BLOOD LEFT HAND  Final   Special Requests   Final    BOTTLES DRAWN AEROBIC AND ANAEROBIC Blood Culture adequate volume   Culture   Final    NO GROWTH 2 DAYS Performed at [facility], [place] [address], [place], [place] [address]    Report Status PENDING  Incomplete  C difficile quick scan w PCR reflex     Status: None   Collection Time: 01/21/17  2:49 AM  Result Value Ref Range Status   C Diff antigen NEGATIVE NEGATIVE Final   C Diff toxin NEGATIVE NEGATIVE Final   C Diff interpretation No C. difficile detected.  Final    Studies/Results: Dg Chest 2 View  Result Date: 01/22/2017 CLINICAL DATA:  Worsening left flank pain over the past 2 days. History of obstructing left ureteral stone. EXAM: CHEST  2 VIEW COMPARISON:  PA and lateral chest 01/20/2017. FINDINGS: Small left pleural effusion and basilar airspace disease have markedly increased since the comparison exam. Mild atelectasis is seen on the right. No pneumothorax. Heart size is upper normal. IMPRESSION: Increased left effusion and basilar airspace disease which could be due to atelectasis for pneumonia. Subsegmental atelectasis right lung base. Electronically Signed   By: Inge Rise M.D.   On: 01/22/2017 10:34    Assessment/Plan:   Pyelonephritis following intervention for left ureteral  stone.  Associated with scrotal swelling and ileus.  Improving.    At this point, we'll saline lock IV as he is taking by mouth fluids fairly well.  We will work on [employer].  He is ambulating well.  Reassess later this evening.   LOS: 0 days   Jorja Loa 01/23/2017, 6:49 AM

## 2017-01-23 NOTE — Progress Notes (Addendum)
Pharmacy Antibiotic Note  Kevin [name] is a 61 y.o. male admitted on 01/20/2017 with pyelonephritis.  Pharmacy has been consulted for zosyn dosing.  He has been on Rocephin since 4/15 with 4 doses given.  WBC 11.5, temp spike to 101.8 (was getting scheduled toradol which is now complete x 5 doses),  last creat 1.24 on 4/16.  4/14 UCx with > 100 K E coli sensitive to all x ampicillin and Unasyn.   Plan: Zosyn 3.375g IV q8h (4 hour infusion).  Height: 6' (182.9 cm) Weight: 200 lb (90.7 kg) IBW/kg (Calculated) : 77.6  Temp (24hrs), Avg:100 F (37.8 C), Min:98.2 F (36.8 C), Max:101.8 F (38.8 C)   Recent Labs Lab 01/17/17 0102 01/20/17 0650 01/20/17 1211 01/21/17 0436 01/22/17 0352 01/23/17 0519  WBC 5.9 8.6  --  11.9* 12.9* 11.5*  CREATININE 1.39* 1.72*  --  1.24  --   --   LATICACIDVEN  --   --  0.84  --   --   --     Estimated Creatinine Clearance: 71.3 mL/min (by C-G formula based on SCr of 1.24 mg/dL).    No Known Allergies  Antimicrobials this admission: Rocephin 4/15>>4/18 Zosyn 4/18>> Dose adjustments this admission:  Microbiology results: 4/16 C diff neg 4/15 BCx2 ngtd 4/12 Ucx> > 100 K E coli - sensitive to all x amp and unasyn  Thank you for allowing pharmacy to be a part of this patient's care.  Eudelia Bunch, Pharm.D. [phone] 01/23/2017 6:37 PM

## 2017-01-24 ENCOUNTER — Encounter (HOSPITAL_COMMUNITY): Payer: Self-pay | Admitting: Urology

## 2017-01-24 ENCOUNTER — Inpatient Hospital Stay (HOSPITAL_COMMUNITY): Payer: BLUE CROSS/BLUE SHIELD

## 2017-01-24 DIAGNOSIS — Z87891 Personal history of nicotine dependence: Secondary | ICD-10-CM

## 2017-01-24 DIAGNOSIS — Z79899 Other long term (current) drug therapy: Secondary | ICD-10-CM

## 2017-01-24 DIAGNOSIS — Z8249 Family history of ischemic heart disease and other diseases of the circulatory system: Secondary | ICD-10-CM

## 2017-01-24 DIAGNOSIS — Z823 Family history of stroke: Secondary | ICD-10-CM

## 2017-01-24 DIAGNOSIS — Z8042 Family history of malignant neoplasm of prostate: Secondary | ICD-10-CM

## 2017-01-24 DIAGNOSIS — N135 Crossing vessel and stricture of ureter without hydronephrosis: Secondary | ICD-10-CM

## 2017-01-24 DIAGNOSIS — Z808 Family history of malignant neoplasm of other organs or systems: Secondary | ICD-10-CM

## 2017-01-24 LAB — CBC WITH DIFFERENTIAL/PLATELET
BASOS PCT: 0 %
Basophils Absolute: 0 10*3/uL (ref 0.0–0.1)
EOS ABS: 0.1 10*3/uL (ref 0.0–0.7)
EOS PCT: 1 %
HCT: 31.6 % — ABNORMAL LOW (ref 39.0–52.0)
HEMOGLOBIN: 11.2 g/dL — AB (ref 13.0–17.0)
LYMPHS PCT: 8 %
Lymphs Abs: 1 10*3/uL (ref 0.7–4.0)
MCH: 31.5 pg (ref 26.0–34.0)
MCHC: 35.4 g/dL (ref 30.0–36.0)
MCV: 88.8 fL (ref 78.0–100.0)
Monocytes Absolute: 1.2 10*3/uL — ABNORMAL HIGH (ref 0.1–1.0)
Monocytes Relative: 9 %
NEUTROS PCT: 82 %
Neutro Abs: 10.6 10*3/uL — ABNORMAL HIGH (ref 1.7–7.7)
Platelets: 213 10*3/uL (ref 150–400)
RBC: 3.56 MIL/uL — AB (ref 4.22–5.81)
RDW: 14 % (ref 11.5–15.5)
WBC: 12.9 10*3/uL — AB (ref 4.0–10.5)

## 2017-01-24 LAB — BASIC METABOLIC PANEL
Anion gap: 8 (ref 5–15)
BUN: 15 mg/dL (ref 6–20)
CO2: 25 mmol/L (ref 22–32)
Calcium: 7.9 mg/dL — ABNORMAL LOW (ref 8.9–10.3)
Chloride: 104 mmol/L (ref 101–111)
Creatinine, Ser: 0.99 mg/dL (ref 0.61–1.24)
GFR calc Af Amer: >60 mL/min (ref >60)
GFR calc non Af Amer: >60 mL/min (ref >60)
Glucose, Bld: 117 mg/dL — ABNORMAL HIGH (ref 65–99)
Potassium: 3.1 mmol/L — ABNORMAL LOW (ref 3.5–5.1)
Sodium: 137 mmol/L (ref 135–145)

## 2017-01-24 MED ORDER — KETOROLAC TROMETHAMINE 15 MG/ML IJ SOLN
15.0000 mg | Freq: Four times a day (QID) | INTRAMUSCULAR | Status: DC | PRN
  Administered 2017-01-24 – 2017-01-25 (×4): 15 mg via INTRAVENOUS
  Filled 2017-01-24 (×4): qty 1

## 2017-01-24 MED ORDER — CEFAZOLIN IN D5W 1 GM/50ML IV SOLN
1.0000 g | Freq: Three times a day (TID) | INTRAVENOUS | Status: DC
  Administered 2017-01-24 – 2017-01-25 (×2): 1 g via INTRAVENOUS
  Filled 2017-01-24 (×3): qty 50

## 2017-01-24 NOTE — Progress Notes (Signed)
Pt reported being unaware of antibiotic change from zosyn to cefazolin. This RN educated pt that Infectious Disease MD recommended cefazolin after Dr. Diona Fanti saw pt today and that explains the antibiotic change. Pt wanted RN to note that he was not aware of the change but was still willing to take the other antibiotic. Will continue to monitor pt closely. Kevin [name] I

## 2017-01-24 NOTE — Consult Note (Signed)
[place] for Infectious Disease       Reason for Consult: fever    Referring Physician: Dr. Diona Fanti  Active Problems:   Ureteral obstruction, left   . acitretin  25 mg Oral Daily  . enoxaparin (LOVENOX) injection  40 mg Subcutaneous Q24H  . magic mouthwash  5 mL Oral TID  . saccharomyces boulardii  250 mg Oral BID    Recommendations: cefazolin   Assessment: He has a deep infection with epididymal involvement and identified organism and fever curve overall has decreased, though spiked to 101.8 yesterday am.  Persistent fever in cases of deep infection is not unexpected and WBC may fluctuate so broadening antibiotics not indicated.  I will narrow to cefazolin.  Can use oral Keflex 500 mg qid at discharge for 7 more days.  He is asking about discharge and from an ID standpoint it is ok to discharge.   Ileus - unknown etiology; infection, pain medications  Antibiotics: Ceftriaxone > cefazolin  HPI: Kevin [name] is a 62 y.o. male with a history of ureteral obstruction related to ureteral edema with recent ureteroscopic stone management who had developed fever and underwent left stent placement, it became dislodged and removed then replaced on 4/15 and fever persisted.  Urine culture with E coli and placed on appropriate antibiotics. Has had continued pain and scrotal swelling concerning for epididymoorchitis.  He said he is continuing to feel better and no fever since yesterday.     Review of Systems:  Constitutional: negative for fevers and chills Gastrointestinal: negative for diarrhea Integument/breast: negative for rash All other systems reviewed and are negative    Past Medical History:  Diagnosis Date  . Hypertension   . 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 Prostate cancer [facility])     Social History  Substance Use Topics  . Smoking status: Former Smoker    Years: 10.00    Types: Cigarettes    Quit date: 10/08/2014  .  Smokeless tobacco: Never Used  . Alcohol use Yes     Comment: 2-5 per day    Family History  Problem Relation Age of Onset  . Stroke Father   . Hypertension Father   . Head & neck cancer Father   . Hypertension Mother   . Skin cancer Mother   . Prostate cancer Paternal Uncle     prostate ca    No Known Allergies  Physical Exam: Constitutional: in no apparent distress and alert  Vitals:   01/24/17 0439 01/24/17 1310  BP: (!) 145/83 133/82  Pulse: 81 69  Resp: 18 16  Temp: 99.5 F (37.5 C) 98.1 F (36.7 C)   EYES: anicteric ENMT: no thrush Cardiovascular: Cor RRR Respiratory: CTA B; normal respiratory effort GI: Bowel sounds are normal, liver is not enlarged, spleen is not enlarged, soft, nt Musculoskeletal: no pedal edema noted Skin: negatives: no rash  Lab Results  Component Value Date   WBC 12.9 (H) 01/24/2017   HGB 11.2 (L) 01/24/2017   HCT 31.6 (L) 01/24/2017   MCV 88.8 01/24/2017   PLT 213 01/24/2017    Lab Results  Component Value Date   CREATININE 0.99 01/24/2017   BUN 15 01/24/2017   NA 137 01/24/2017   K 3.1 (L) 01/24/2017   CL 104 01/24/2017   CO2 25 01/24/2017   No results found for: ALT, AST, GGT, ALKPHOS   Microbiology: Recent Results (from the past 240 hour(s))  Urine culture  Status: Abnormal   Collection Time: 01/17/17  2:43 AM  Result Value Ref Range Status   Specimen Description URINE, CLEAN CATCH  Final   Special Requests NONE  Final   Culture >=100,000 COLONIES/mL ESCHERICHIA COLI (A)  Final   Report Status 01/19/2017 FINAL  Final   Organism ID, Bacteria ESCHERICHIA COLI (A)  Final      Susceptibility   Escherichia coli - MIC*    AMPICILLIN >=32 RESISTANT Resistant     CEFAZOLIN <=4 SENSITIVE Sensitive     CEFTRIAXONE <=1 SENSITIVE Sensitive     CIPROFLOXACIN <=0.25 SENSITIVE Sensitive     GENTAMICIN <=1 SENSITIVE Sensitive     IMIPENEM <=0.25 SENSITIVE Sensitive     NITROFURANTOIN <=16 SENSITIVE Sensitive      TRIMETH/SULFA <=20 SENSITIVE Sensitive     AMPICILLIN/SULBACTAM 16 INTERMEDIATE Intermediate     PIP/TAZO <=4 SENSITIVE Sensitive     Extended ESBL NEGATIVE Sensitive     * >=100,000 COLONIES/mL ESCHERICHIA COLI  Blood culture (routine x 2)     Status: None (Preliminary result)   Collection Time: 01/20/17 12:15 PM  Result Value Ref Range Status   Specimen Description BLOOD BLOOD LEFT FOREARM  Final   Special Requests   Final    BOTTLES DRAWN AEROBIC AND ANAEROBIC Blood Culture adequate volume   Culture   Final    NO GROWTH 4 DAYS Performed at [facility], [address] [place], [place] [address]    Report Status PENDING  Incomplete  Blood culture (routine x 2)     Status: None (Preliminary result)   Collection Time: 01/20/17 12:15 PM  Result Value Ref Range Status   Specimen Description BLOOD LEFT HAND  Final   Special Requests   Final    BOTTLES DRAWN AEROBIC AND ANAEROBIC Blood Culture adequate volume   Culture   Final    NO GROWTH 4 DAYS Performed at [facility], [place] [address], [place], [place] [address]    Report Status PENDING  Incomplete  C difficile quick scan w PCR reflex     Status: None   Collection Time: 01/21/17  2:49 AM  Result Value Ref Range Status   C Diff antigen NEGATIVE NEGATIVE Final   C Diff toxin NEGATIVE NEGATIVE Final   C Diff interpretation No C. difficile detected.  Final    COMER, Herbie [place], [place] for Infectious Disease [place] [email or web address] [phone] pager  [phone] cell 01/24/2017, 3:29 PM

## 2017-01-24 NOTE — Progress Notes (Signed)
4 Days Post-Op Subjective: Patient reports that he is feeling a bit better.  He has had more bowel movements and flatus.  Objective: Vital signs in last 24 hours: Temp:  [98.2 F (36.8 C)-101.8 F (38.8 C)] 99.5 F (37.5 C) (04/19 0439) Pulse Rate:  [72-84] 81 (04/19 0439) Resp:  [18-20] 18 (04/19 0439) BP: (137-153)/(83-88) 145/83 (04/19 0439) SpO2:  [95 %-98 %] 96 % (04/19 0439)  Intake/Output from previous day: No intake/output data recorded. Intake/Output this shift: No intake/output data recorded.  Physical Exam:  Constitutional: Vital signs reviewed. WD WN in NAD   Eyes: PERRL, No scleral icterus.   Pulmonary/Chest: Normal effort Abdominal:  abdomen is less distended.  Less tenderness.  Genitourinary: Less scrotal edema/erythema.  Less left testicular tenderness.  Still with some induration in the left inguinal region, decreasing Extremities: No cyanosis or edema   Lab Results:  Recent Labs  01/22/17 0352 01/23/17 0519 01/24/17 0456  HGB 11.3* 11.2* 11.2*  HCT 32.9* 32.7* 31.6*   BMET  Recent Labs  01/24/17 0456  NA 137  K 3.1*  CL 104  CO2 25  GLUCOSE 117*  BUN 15  CREATININE 0.99  CALCIUM 7.9*   No results for input(s): LABPT, INR in the last 72 hours. No results for input(s): LABURIN in the last 72 hours. Results for orders placed or performed during the hospital encounter of 01/20/17  Blood culture (routine x 2)     Status: None (Preliminary result)   Collection Time: 01/20/17 12:15 PM  Result Value Ref Range Status   Specimen Description BLOOD BLOOD LEFT FOREARM  Final   Special Requests   Final    BOTTLES DRAWN AEROBIC AND ANAEROBIC Blood Culture adequate volume   Culture   Final    NO GROWTH 3 DAYS Performed at [facility], [place] [address], [place], [place] [address]    Report Status PENDING  Incomplete  Blood culture (routine x 2)     Status: None (Preliminary result)   Collection Time: 01/20/17 12:15 PM  Result Value Ref Range  Status   Specimen Description BLOOD LEFT HAND  Final   Special Requests   Final    BOTTLES DRAWN AEROBIC AND ANAEROBIC Blood Culture adequate volume   Culture   Final    NO GROWTH 3 DAYS Performed at [facility], [place] [address], [place], [place] [address]    Report Status PENDING  Incomplete  C difficile quick scan w PCR reflex     Status: None   Collection Time: 01/21/17  2:49 AM  Result Value Ref Range Status   C Diff antigen NEGATIVE NEGATIVE Final   C Diff toxin NEGATIVE NEGATIVE Final   C Diff interpretation No C. difficile detected.  Final    Studies/Results: Dg Chest 2 View  Result Date: 01/22/2017 CLINICAL DATA:  Worsening left flank pain over the past 2 days. History of obstructing left ureteral stone. EXAM: CHEST  2 VIEW COMPARISON:  PA and lateral chest 01/20/2017. FINDINGS: Small left pleural effusion and basilar airspace disease have markedly increased since the comparison exam. Mild atelectasis is seen on the right. No pneumothorax. Heart size is upper normal. IMPRESSION: Increased left effusion and basilar airspace disease which could be due to atelectasis for pneumonia. Subsegmental atelectasis right lung base. Electronically Signed   By: Inge Rise M.D.   On: 01/22/2017 10:34   US Renal  Result Date: 01/24/2017 CLINICAL DATA:  61 year old male with history of pyelonephritis. Persistent fever.  Evaluate for potential renal abscess. EXAM: RENAL / URINARY TRACT ULTRASOUND COMPLETE COMPARISON:  No priors.  CT the abdomen and pelvis 01/17/2017. FINDINGS: Right Kidney: Length: 12.9 cm. Echogenicity within normal limits. No mass or hydronephrosis visualized. Left Kidney: Length: 13.7 cm. Echogenicity within normal limits. No mass or hydronephrosis visualized. Bladder: Appears normal for degree of bladder distention. IMPRESSION: 1. No acute abnormality noted. Specifically, no findings to suggest renal abscess. Electronically Signed   By: Vinnie Langton M.D.   On:  01/24/2017 09:10    Assessment/Plan:   Significant urinary tract infection/pyelonephritis/probable epididymoorchitis following ureteroscopy, laser, extraction of stone with stenting, with eventual migrated stent.  Replaced about 4 days ago.  He still has an elevated white blood count, but appears clinically improved.    Ileus, nonspecific, seemingly resolving.    We will proceed with renal ultrasound today to rule out obstruction of the left kidney or perinephric process.  I will also ask for an infectious disease consult to review the patient's clinical course and further antibiotic management.   LOS: 1 day   Jorja Loa 01/24/2017, 9:37 AM

## 2017-01-24 NOTE — Progress Notes (Signed)
Pharmacy Antibiotic Note  Kevin [name] is a 61 y.o. male admitted on 01/20/2017 with pyelonephritis.  Pharmacy has been consulted for zosyn dosing.  He has been on Rocephin since 4/15 with 4 doses given.  WBC 11.5, temp spike to 101.8 (was getting scheduled toradol which is now complete x 5 doses),  last creat 1.24 on 4/16.  4/14 UCx with > 100 K E coli sensitive to all x ampicillin and Unasyn.   Today, 01/24/2017:  Seen by ID, narrowing to Ancef  Plan:  Ancef 1g IV q8 hr  See comments regarding switching to Keflex at discharge  Height: 6' (182.9 cm) Weight: 200 lb (90.7 kg) IBW/kg (Calculated) : 77.6  Temp (24hrs), Avg:98.7 F (37.1 C), Min:98.1 F (36.7 C), Max:99.5 F (37.5 C)   Recent Labs Lab 01/20/17 0650 01/20/17 1211 01/21/17 0436 01/22/17 0352 01/23/17 0519 01/24/17 0456  WBC 8.6  --  11.9* 12.9* 11.5* 12.9*  CREATININE 1.72*  --  1.24  --   --  0.99  LATICACIDVEN  --  0.84  --   --   --   --     Estimated Creatinine Clearance: 89.3 mL/min (by C-G formula based on SCr of 0.99 mg/dL).    No Known Allergies  Antimicrobials this admission: Rocephin 4/15>>4/18 Zosyn 4/18>> 4/19 Ancef >>  Dose adjustments this admission:  Microbiology results: 4/16 C diff neg 4/15 BCx2 ngtd 4/12 Ucx> > 100 K E coli - sensitive to all x amp and unasyn  Thank you for allowing pharmacy to be a part of this patient's care.  Reuel Boom, PharmD, BCPS Pager: [phone] 01/24/2017, 6:07 PM

## 2017-01-25 MED ORDER — SULFAMETHOXAZOLE-TRIMETHOPRIM 800-160 MG PO TABS: 1.0000 | ORAL_TABLET | Freq: Two times a day (BID) | ORAL | 0 refills | Status: DC

## 2017-01-25 MED ORDER — MAGIC MOUTHWASH: 5.0000 mL | Freq: Two times a day (BID) | ORAL | 0 refills | Status: DC

## 2017-01-25 NOTE — Discharge Summary (Signed)
Patient ID: Kevin [name] MRN: [id number] DOB/AGE: January 15, 1956 61 y.o.  Admit date: 01/20/2017 Discharge date: 01/25/2017  Primary Care Physician:  Henrine Screws, MD  Discharge Diagnoses:   Present on Admission: . Ureteral obstruction, left Urinary tact infection Ileus Scrotal infection  Consults:  ID     Discharge Medications: See medical records   Significant Diagnostic Studies:  Dg Chest 2 View  Result Date: 01/20/2017 CLINICAL DATA:  Weakness, chills and shortness of breath. Cystoscopy, left ureteral stone extraction and left urinary stent placement 3 days ago. EXAM: CHEST  2 VIEW COMPARISON:  11/26/2013 radiograph FINDINGS: This is a mildly low volume film. The cardiomediastinal silhouette is unremarkable. Pulmonary vascular congestion and trace bilateral pleural effusions are noted. Mild left basilar atelectasis is noted. There is no evidence of pneumothorax. No acute bony abnormalities are noted. IMPRESSION: Pulmonary vascular congestion, trace bilateral pleural effusions and mild left basilar atelectasis. Electronically Signed   By: Margarette Canada M.D.   On: 01/20/2017 10:26   Dg Abd 2 Views  Result Date: 01/20/2017 CLINICAL DATA:  Abdominal pain.  Status post lithotripsy. EXAM: ABDOMEN - 2 VIEW COMPARISON:  CT abdomen pelvis 01/17/2017 FINDINGS: Visualized lung bases are normal. There is no free intraperitoneal air. The visualized colon is largely gas-filled and distended. There is stool within the cecum and ascending colon. Prominent loop of gas-filled small bowel is noted in the left lower quadrant. Opacity overlying the right renal shadow likely corresponds to the 3 mm interpolar stone demonstrated on recent CT. No other radiopaque calculi. Specifically, no calculi along the course of the ureters. IMPRESSION: No radiographically evident ureteral calculus. Calcification overlying the right renal shadow may correspond to the 3 mm interpolar stone seen on previous CT.  Electronically Signed   By: Ulyses Jarred M.D.   On: 01/20/2017 07:00   Dg C-arm 1-60 Min-no Report  Result Date: 01/20/2017 Fluoroscopy was utilized by the requesting physician.  No radiographic interpretation.    Brief H and P: For complete details please refer to admission H and P, but in brief the patient was admitted following displacement of his left double-J stent.  Several days after ureteroscopy, with evidence of urinary tract infection as well as fever.  He also had scrotal swelling which was new.  Hospital Course: The patient was admitted following urgent stent replacement by Dr. Alinda Money.  There was evidence of urinary tract infection.  His urine grew Escherichia coli.  Blood cultures were negative.  He developed an ileus as well as significant scrotal swelling which was felt to, more than likely, be due to bacterial infection associated with his urine infection.  He was treated with an adequate course of IV antibiotics.  His ileus gradually resolved.  He was placed on a regular diet.  He defervesced, and scrotal swelling improved as well with IV and conservative management.  The patient was discharged on 01/25/2017 in improved condition. Active Problems:   Ureteral obstruction, left Ileus. Scrotal swelling, possible infection.  Day of Discharge BP 134/84 (BP Location: Left Arm)   Pulse 71   Temp 99.3 F (37.4 C) (Oral)   Resp 16   Ht 6' (1.829 m)   Wt 90.7 kg (200 lb)   SpO2 100%   BMI 27.12 kg/m   No results found for this or any previous visit (from the past 24 hour(s)).  Physical Exam: General: Alert and awake oriented x3 not in any acute distress. HEENT: anicteric sclera, pupils reactive to light and accommodation CVS: S1-S2 clear  no murmur rubs or gallops Chest: clear to auscultation bilaterally, no wheezing rales or rhonchi Abdomen: soft nontender, nondistended, normal bowel sounds, no organomegaly Extremities: no cyanosis, clubbing or edema noted  bilaterally Neuro: Cranial nerves II-XII intact, no focal neurological deficits  Disposition:  Home  Diet:  No restrictions  Activity:  Gradually increase   Disposition and Follow-up:    He will follow-up in our office for eventual cystoscopy and stent removal  TESTS THAT NEED FOLLOW-UP  None  DISCHARGE FOLLOW-UP   Time spent on Discharge:  15 minutes  Signed: Jorja Loa 01/25/2017, 6:34 AM

## 2017-01-25 NOTE — Discharge Instructions (Signed)

## 2017-01-26 LAB — CULTURE, BLOOD (ROUTINE X 2)
CULTURE: NO GROWTH
Culture: NO GROWTH
SPECIAL REQUESTS: ADEQUATE
SPECIAL REQUESTS: ADEQUATE

## 2017-02-07 DIAGNOSIS — Z79899 Other long term (current) drug therapy: Secondary | ICD-10-CM | POA: Diagnosis not present

## 2017-02-07 DIAGNOSIS — C8400 Mycosis fungoides, unspecified site: Secondary | ICD-10-CM | POA: Diagnosis not present

## 2017-02-07 NOTE — Discharge Summary (Signed)
Patient ID: LOUIE FLENNER MRN: [id number] DOB/AGE: [birth year] 61 y.o.  Admit date: 01/20/2017 Discharge date:01/24/2017  Primary Care Physician:  Henrine Screws, MD  Discharge Diagnoses:   Present on Admission: . Ureteral obstruction, left Urinary tract infection  Consults:   infectious disease   Discharge Medications: Allergies as of 01/25/2017   No Known Allergies     Medication List    STOP taking these medications   cephALEXin 500 MG capsule Commonly known as:  KEFLEX     TAKE these medications   acitretin 25 MG capsule Commonly known as:  SORIATANE Take 1 capsule by mouth daily.   amLODipine-valsartan 5-160 MG tablet Commonly known as:  EXFORGE Take 1 tablet by mouth daily.   HYDROcodone-acetaminophen 5-325 MG tablet Commonly known as:  NORCO Take 1-2 tablets by mouth every 4 (four) hours as needed for moderate pain.   INTRON A 6000000 UNIT/ML injection Generic drug:  interferon alfa-2b Inject 1 each as directed 3 (three) times a week.   magic mouthwash Soln Take 5 mLs by mouth 2 (two) times daily.   milk thistle 175 MG tablet Take 175 mg by mouth daily.   oxybutynin 5 MG tablet Commonly known as:  DITROPAN Take 1 tablet (5 mg total) by mouth every 8 (eight) hours as needed for bladder spasms.   psyllium 95 % Pack Commonly known as:  HYDROCIL/METAMUCIL Take 1 packet by mouth daily.   Red Yeast Rice Extract 600 MG Caps Take 2 capsules by mouth daily.   sulfamethoxazole-trimethoprim 800-160 MG tablet Commonly known as:  BACTRIM DS,SEPTRA DS Take 1 tablet by mouth 2 (two) times daily.        Significant Diagnostic Studies:  Dg Chest 2 View  Result Date: 01/20/2017 CLINICAL DATA:  Weakness, chills and shortness of breath. Cystoscopy, left ureteral stone extraction and left urinary stent placement 3 days ago. EXAM: CHEST  2 VIEW COMPARISON:  11/26/2013 radiograph FINDINGS: This is a mildly low volume film. The cardiomediastinal silhouette is  unremarkable. Pulmonary vascular congestion and trace bilateral pleural effusions are noted. Mild left basilar atelectasis is noted. There is no evidence of pneumothorax. No acute bony abnormalities are noted. IMPRESSION: Pulmonary vascular congestion, trace bilateral pleural effusions and mild left basilar atelectasis. Electronically Signed   By: Margarette Canada M.D.   On: 01/20/2017 10:26   Dg Abd 2 Views  Result Date: 01/20/2017 CLINICAL DATA:  Abdominal pain.  Status post lithotripsy. EXAM: ABDOMEN - 2 VIEW COMPARISON:  CT abdomen pelvis 01/17/2017 FINDINGS: Visualized lung bases are normal. There is no free intraperitoneal air. The visualized colon is largely gas-filled and distended. There is stool within the cecum and ascending colon. Prominent loop of gas-filled small bowel is noted in the left lower quadrant. Opacity overlying the right renal shadow likely corresponds to the 3 mm interpolar stone demonstrated on recent CT. No other radiopaque calculi. Specifically, no calculi along the course of the ureters. IMPRESSION: No radiographically evident ureteral calculus. Calcification overlying the right renal shadow may correspond to the 3 mm interpolar stone seen on previous CT. Electronically Signed   By: Ulyses Jarred M.D.   On: 01/20/2017 07:00   Dg C-arm 1-60 Min-no Report  Result Date: 01/20/2017 Fluoroscopy was utilized by the requesting physician.  No radiographic interpretation.    Brief H and P: For complete details please refer to admission H and P, but in brief , the patient was admitted for left flank pain as well as a high fever following management of  a left upper ureteral stone.  At the time of his admission, the patient's double-J stent had been dislodged and removed.  Because of significant leakage.  He developed a fever prior to admission, and because of his high fever and evidence of infection, he was admitted for antibiotic management as well as stent placement.  Hospital Course:   Active Problems:   Ureteral obstruction, left The patient was admitted to the urology service urgently following cystoscopy and replacement of a left double-J stent.  The patient did grow Escherichia coli from his urine, although blood cultures were negative.  The patient experienced significant scrotal swelling, but there was no evidence of scrotal abscess.  It took several days for the patient to defervesce, but with adequate IV management, the patient's scrotal swelling, left inguinal swelling as well as fever resolved.  Day of Discharge BP 134/84 (BP Location: Left Arm)   Pulse 71   Temp 99.3 F (37.4 C) (Oral)   Resp 16   Ht 6' (1.829 m)   Wt 90.7 kg (200 lb)   SpO2 100%   BMI 27.12 kg/m   No results found for this or any previous visit (from the past 24 hour(s)).  Physical Exam: General: Alert and awake oriented x3 not in any acute distress. HEENT: anicteric sclera, pupils reactive to light and accommodation CVS: S1-S2 clear no murmur rubs or gallops Chest: clear to auscultation bilaterally, no wheezing rales or rhonchi Abdomen: soft nontender, nondistended, normal bowel sounds, no organomegaly Extremities: no cyanosis, clubbing or edema noted bilaterally Neuro: Cranial nerves II-XII intact, no focal neurological deficits  Disposition:  Home  Diet:  Regular  Activity:  Gradually increase   Disposition and Follow-up:    Follow-up in the office for cystoscopy and stent removal.  He will complete antibiotic management.  TESTS THAT NEED FOLLOW-UP  N/A   DISCHARGE FOLLOW-UP   Time spent on Discharge:  15 mins  Signed:  Addendum-the discharge summary labeled 01/17/2017 was mistakenly dictated as this discharge summary.  I have been unable to add that prior discharge summary. Jorja Loa 02/07/2017, 5:43 AM

## 2017-02-07 NOTE — Discharge Summary (Signed)
Patient ID: Kevin [name] MRN: [id number] DOB/AGE: 59-Mar-1957 59 y.o.  Admit date: 01/17/2017 Discharge date: 02/07/2017  Primary Care Physician:  Kevin Screws, MD  Discharge Diagnoses:   Present on Admission: . Calculus of ureter Urinary tract infection Consults:   infectious disease   Discharge Medications: Allergies as of 01/17/2017   No Known Allergies     Medication List    TAKE these medications   acitretin 25 MG capsule Commonly known as:  SORIATANE Take 1 capsule by mouth daily.   amLODipine-valsartan 5-160 MG tablet Commonly known as:  EXFORGE Take 1 tablet by mouth daily.   HYDROcodone-acetaminophen 5-325 MG tablet Commonly known as:  NORCO Take 1-2 tablets by mouth every 4 (four) hours as needed for moderate pain.   INTRON A 6000000 UNIT/ML injection Generic drug:  interferon alfa-2b Inject 1 each as directed 3 (three) times a week.   milk thistle 175 MG tablet Take 175 mg by mouth daily.   oxybutynin 5 MG tablet Commonly known as:  DITROPAN Take 1 tablet (5 mg total) by mouth every 8 (eight) hours as needed for bladder spasms.   psyllium 95 % Pack Commonly known as:  HYDROCIL/METAMUCIL Take 1 packet by mouth daily.   Red Yeast Rice Extract 600 MG Caps Take 2 capsules by mouth daily.        Significant Diagnostic Studies:  Dg Chest 2 View  Result Date: 01/20/2017 CLINICAL DATA:  Weakness, chills and shortness of breath. Cystoscopy, left ureteral stone extraction and left urinary stent placement 3 days ago. EXAM: CHEST  2 VIEW COMPARISON:  11/26/2013 radiograph FINDINGS: This is a mildly low volume film. The cardiomediastinal silhouette is unremarkable. Pulmonary vascular congestion and trace bilateral pleural effusions are noted. Mild left basilar atelectasis is noted. There is no evidence of pneumothorax. No acute bony abnormalities are noted. IMPRESSION: Pulmonary vascular congestion, trace bilateral pleural effusions and mild left basilar  atelectasis. Electronically Signed   By: Kevin [name] M.D.   On: 01/20/2017 10:26   Dg Abd 2 Views  Result Date: 01/20/2017 CLINICAL DATA:  Abdominal pain.  Status post lithotripsy. EXAM: ABDOMEN - 2 VIEW COMPARISON:  CT abdomen pelvis 01/17/2017 FINDINGS: Visualized lung bases are normal. There is no free intraperitoneal air. The visualized colon is largely gas-filled and distended. There is stool within the cecum and ascending colon. Prominent loop of gas-filled small bowel is noted in the left lower quadrant. Opacity overlying the right renal shadow likely corresponds to the 3 mm interpolar stone demonstrated on recent CT. No other radiopaque calculi. Specifically, no calculi along the course of the ureters. IMPRESSION: No radiographically evident ureteral calculus. Calcification overlying the right renal shadow may correspond to the 3 mm interpolar stone seen on previous CT. Electronically Signed   By: Kevin [name] M.D.   On: 01/20/2017 07:00   Dg C-arm 1-60 Min-no Report  Result Date: 01/20/2017 Fluoroscopy was utilized by the requesting physician.  No radiographic interpretation.    Brief H and P: For complete details please refer to admission H and P, but in brief , the patient was admitted for left flank pain as well as a high fever following management of a left upper ureteral stone.  At the time of his admission, the patient's double-J stent had been dislodged and removed.  Because of significant leakage.  He developed a fever prior to admission, and because of his high fever and evidence of infection, he was admitted for antibiotic management as well as stent placement.  Hospital Course:  Active Problems:   Calculus of ureter The patient was admitted to the urology service urgently following cystoscopy and replacement of a left double-J stent.  The patient did grow Kevin [name] from his urine, although blood cultures were negative.  The patient experienced significant scrotal  swelling, but there was no evidence of scrotal abscess.  It took several days for the patient to defervesce, but with adequate IV management, the patient's scrotal swelling, left inguinal swelling as well as fever resolved.  Day of Discharge BP 120/74 (BP Location: Right Arm)   Pulse 96   Temp 99 F (37.2 C)   Resp 16   Ht 6' (1.829 m)   Wt 94.5 kg (208 lb 5.4 oz)   SpO2 96%   BMI 28.26 kg/m   No results found for this or any previous visit (from the past 24 hour(s)).  Physical Exam: General: Alert and awake oriented x3 not in any acute distress. HEENT: anicteric sclera, pupils reactive to light and accommodation CVS: S1-S2 clear no murmur rubs or gallops Chest: clear to auscultation bilaterally, no wheezing rales or rhonchi Abdomen: soft nontender, nondistended, normal bowel sounds, no organomegaly Extremities: no cyanosis, clubbing or edema noted bilaterally Neuro: Cranial nerves II-XII intact, no focal neurological deficits  Disposition:  Home  Diet:  No restrictions  Activity:  Gradually increase   Disposition and Follow-up:    He was discharged to home.  Discharge instructions were provided.  Patient will complete a course of oral antibiotics.  TESTS THAT NEED FOLLOW-UP  None  DISCHARGE FOLLOW-UP Follow-up Information    Kevin [name], Kevin Boxer, MD.   Specialty:  Urology Why:  We will call to set up appointment Contact information: [place] [address] [phone]           Time spent on Discharge:  15 minutes  Signed: [name] 02/07/2017, 5:36 AM

## 2017-02-13 DIAGNOSIS — B356 Tinea cruris: Secondary | ICD-10-CM | POA: Diagnosis not present

## 2017-02-13 DIAGNOSIS — N201 Calculus of ureter: Secondary | ICD-10-CM | POA: Diagnosis not present

## 2017-03-25 DIAGNOSIS — C8400 Mycosis fungoides, unspecified site: Secondary | ICD-10-CM | POA: Diagnosis not present

## 2017-03-25 DIAGNOSIS — Z79899 Other long term (current) drug therapy: Secondary | ICD-10-CM | POA: Diagnosis not present

## 2017-05-08 DIAGNOSIS — Z79899 Other long term (current) drug therapy: Secondary | ICD-10-CM | POA: Diagnosis not present

## 2017-05-08 DIAGNOSIS — C8400 Mycosis fungoides, unspecified site: Secondary | ICD-10-CM | POA: Diagnosis not present

## 2017-05-31 DIAGNOSIS — N5231 Erectile dysfunction following radical prostatectomy: Secondary | ICD-10-CM | POA: Diagnosis not present

## 2017-05-31 DIAGNOSIS — N201 Calculus of ureter: Secondary | ICD-10-CM | POA: Diagnosis not present

## 2017-05-31 DIAGNOSIS — C61 Malignant neoplasm of prostate: Secondary | ICD-10-CM | POA: Diagnosis not present

## 2017-06-07 DIAGNOSIS — C61 Malignant neoplasm of prostate: Secondary | ICD-10-CM | POA: Diagnosis not present

## 2017-06-17 DIAGNOSIS — R9721 Rising PSA following treatment for malignant neoplasm of prostate: Secondary | ICD-10-CM | POA: Diagnosis not present

## 2017-06-17 DIAGNOSIS — N5231 Erectile dysfunction following radical prostatectomy: Secondary | ICD-10-CM | POA: Diagnosis not present

## 2017-06-17 DIAGNOSIS — C61 Malignant neoplasm of prostate: Secondary | ICD-10-CM | POA: Diagnosis not present

## 2017-07-08 DIAGNOSIS — Z79899 Other long term (current) drug therapy: Secondary | ICD-10-CM | POA: Diagnosis not present

## 2017-07-08 DIAGNOSIS — L652 Alopecia mucinosa: Secondary | ICD-10-CM | POA: Diagnosis not present

## 2017-07-08 DIAGNOSIS — C8400 Mycosis fungoides, unspecified site: Secondary | ICD-10-CM | POA: Diagnosis not present

## 2017-08-12 DIAGNOSIS — R0602 Shortness of breath: Secondary | ICD-10-CM | POA: Diagnosis not present

## 2017-08-12 DIAGNOSIS — Z23 Encounter for immunization: Secondary | ICD-10-CM | POA: Diagnosis not present

## 2017-08-12 DIAGNOSIS — G5622 Lesion of ulnar nerve, left upper limb: Secondary | ICD-10-CM | POA: Diagnosis not present

## 2017-09-02 ENCOUNTER — Other Ambulatory Visit: Payer: Self-pay | Admitting: Urology

## 2017-09-02 DIAGNOSIS — C61 Malignant neoplasm of prostate: Secondary | ICD-10-CM

## 2017-09-12 DIAGNOSIS — C8400 Mycosis fungoides, unspecified site: Secondary | ICD-10-CM | POA: Diagnosis not present

## 2017-09-12 DIAGNOSIS — L718 Other rosacea: Secondary | ICD-10-CM | POA: Diagnosis not present

## 2017-09-12 DIAGNOSIS — C44612 Basal cell carcinoma of skin of right upper limb, including shoulder: Secondary | ICD-10-CM | POA: Diagnosis not present

## 2017-09-12 DIAGNOSIS — D1801 Hemangioma of skin and subcutaneous tissue: Secondary | ICD-10-CM | POA: Diagnosis not present

## 2017-09-17 ENCOUNTER — Encounter (HOSPITAL_COMMUNITY): Payer: Self-pay

## 2017-09-17 ENCOUNTER — Ambulatory Visit (HOSPITAL_COMMUNITY): Payer: BLUE CROSS/BLUE SHIELD

## 2017-09-19 ENCOUNTER — Ambulatory Visit (HOSPITAL_COMMUNITY)
Admission: RE | Admit: 2017-09-19 | Discharge: 2017-09-19 | Disposition: A | Discharge status: Home / Self Care | Payer: BLUE CROSS/BLUE SHIELD | Source: Ambulatory Visit | Attending: Urology | Admitting: Urology

## 2017-09-19 DIAGNOSIS — C61 Malignant neoplasm of prostate: Secondary | ICD-10-CM | POA: Diagnosis not present

## 2017-09-19 DIAGNOSIS — R9721 Rising PSA following treatment for malignant neoplasm of prostate: Secondary | ICD-10-CM | POA: Insufficient documentation

## 2017-09-19 MED ORDER — AXUMIN (FLUCICLOVINE F 18) INJECTION
9.9000 | Freq: Once | INTRAVENOUS | Status: AC
  Administered 2017-09-19: 9.9 via INTRAVENOUS

## 2017-09-23 DIAGNOSIS — C61 Malignant neoplasm of prostate: Secondary | ICD-10-CM | POA: Diagnosis not present

## 2017-09-26 DIAGNOSIS — H524 Presbyopia: Secondary | ICD-10-CM | POA: Diagnosis not present

## 2017-09-26 DIAGNOSIS — H33302 Unspecified retinal break, left eye: Secondary | ICD-10-CM | POA: Diagnosis not present

## 2017-09-26 DIAGNOSIS — H2513 Age-related nuclear cataract, bilateral: Secondary | ICD-10-CM | POA: Diagnosis not present

## 2017-09-27 ENCOUNTER — Ambulatory Visit (HOSPITAL_COMMUNITY): Payer: BLUE CROSS/BLUE SHIELD

## 2017-10-07 NOTE — Addendum Note (Signed)
Encounter addended by: Greer Pickerel on: 10/07/2017 4:02 PM  Actions taken: Imaging Exam ended

## 2017-10-21 DIAGNOSIS — R972 Elevated prostate specific antigen [PSA]: Secondary | ICD-10-CM | POA: Diagnosis not present

## 2017-10-21 DIAGNOSIS — Z79899 Other long term (current) drug therapy: Secondary | ICD-10-CM | POA: Diagnosis not present

## 2017-10-21 DIAGNOSIS — C61 Malignant neoplasm of prostate: Secondary | ICD-10-CM | POA: Diagnosis not present

## 2017-10-21 DIAGNOSIS — Z87891 Personal history of nicotine dependence: Secondary | ICD-10-CM | POA: Diagnosis not present

## 2017-11-12 DIAGNOSIS — C8400 Mycosis fungoides, unspecified site: Secondary | ICD-10-CM | POA: Diagnosis not present

## 2017-11-12 DIAGNOSIS — C8409 Mycosis fungoides, extranodal and solid organ sites: Secondary | ICD-10-CM | POA: Diagnosis not present

## 2017-11-12 DIAGNOSIS — L652 Alopecia mucinosa: Secondary | ICD-10-CM | POA: Diagnosis not present

## 2017-12-20 DIAGNOSIS — Z23 Encounter for immunization: Secondary | ICD-10-CM | POA: Diagnosis not present

## 2018-01-23 DIAGNOSIS — I82401 Acute embolism and thrombosis of unspecified deep veins of right lower extremity: Secondary | ICD-10-CM | POA: Diagnosis not present

## 2018-02-03 DIAGNOSIS — C61 Malignant neoplasm of prostate: Secondary | ICD-10-CM | POA: Diagnosis not present

## 2018-02-05 ENCOUNTER — Encounter: Payer: Self-pay | Admitting: Radiation Oncology

## 2018-02-10 DIAGNOSIS — L986 Other infiltrative disorders of the skin and subcutaneous tissue: Secondary | ICD-10-CM | POA: Diagnosis not present

## 2018-02-10 DIAGNOSIS — R21 Rash and other nonspecific skin eruption: Secondary | ICD-10-CM | POA: Diagnosis not present

## 2018-02-10 DIAGNOSIS — C8400 Mycosis fungoides, unspecified site: Secondary | ICD-10-CM | POA: Diagnosis not present

## 2018-02-10 DIAGNOSIS — L985 Mucinosis of the skin: Secondary | ICD-10-CM | POA: Diagnosis not present

## 2018-02-10 DIAGNOSIS — B958 Unspecified staphylococcus as the cause of diseases classified elsewhere: Secondary | ICD-10-CM | POA: Diagnosis not present

## 2018-02-10 DIAGNOSIS — Z8546 Personal history of malignant neoplasm of prostate: Secondary | ICD-10-CM | POA: Diagnosis not present

## 2018-02-10 DIAGNOSIS — L905 Scar conditions and fibrosis of skin: Secondary | ICD-10-CM | POA: Diagnosis not present

## 2018-02-13 DIAGNOSIS — C61 Malignant neoplasm of prostate: Secondary | ICD-10-CM | POA: Diagnosis not present

## 2018-02-13 DIAGNOSIS — E785 Hyperlipidemia, unspecified: Secondary | ICD-10-CM | POA: Diagnosis not present

## 2018-02-13 DIAGNOSIS — I82401 Acute embolism and thrombosis of unspecified deep veins of right lower extremity: Secondary | ICD-10-CM | POA: Diagnosis not present

## 2018-02-13 DIAGNOSIS — Z23 Encounter for immunization: Secondary | ICD-10-CM | POA: Diagnosis not present

## 2018-02-13 DIAGNOSIS — C84A0 Cutaneous T-cell lymphoma, unspecified, unspecified site: Secondary | ICD-10-CM | POA: Diagnosis not present

## 2018-02-18 ENCOUNTER — Encounter: Payer: Self-pay | Admitting: Radiation Oncology

## 2018-02-18 NOTE — Progress Notes (Signed)
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  If Prostate Cancer, Gleason Score is (4 + 5) and PSA is (4.59) pre treatment.   10/21/2017 PSA  0.15 06/07/2017 PSA  0.062 11/01/2016 PSA  <0.01  A. Prostate and seminal vesicles, radical prostatectomy:  Specimen Procedure Radical prostatectomy  Prostate size 4.9 x 2.9 x 2.3 cm  Prostate weight 23 g  Integrity Intact   Overall tumor burden Histologic type Adenocarcinoma  Number of foci 2  Prognostic group 5  Quantitation 16% of examined tissue   Individual tumor foci Index tumor Slide A7-10  Index feature(s) Highest grade, largest size  Primary Gleason 4  Secondary Gleason 5  Gleason sum 9  Tertiary Gleason 3  Quadrant location Left posterior quadrant  Size 2.8 x 1.8 x 1.7 cm  Quantitation 15% of examined tissue   Additional tumor Slide A8  Primary Gleason 3  Secondary Gleason 3  Gleason sum 6  Tertiary Gleason Not applicable  Quadrant location Right anterior quadrant  Size 0.6 cm  Quantitation 1% of examined tissue   Extraprostatic extension Apex Not identified  Bladder neck Not identified   Right anterior Not identified  Right posterior Not identified  Left anterior Not identified  Left posterior Not identified   Right seminal vesicle Not identified  Left seminal vesicle Not identified   Margins Apex Negative  Bladder neck Negative   Right anterior Negative  Right posterior Negative  Left anterior Negative  Left posterior Negative   Right seminal vesicle Negative  Left seminal vesicle Negative   Accessory Angioinvasion Negative  Perineural invasion Positive  Therapy effect Not applicable   Additional findings Chronic prostatitis   Block(s)  for special studies A7-8   Nodes See separately submitted specimen   Pathologic stage [id number]     Comment: Key portions of this case were reviewed with Dr. Lattie Corns, who concurs.   B. Left pelvic lymph node packet, lymphadenectomy:  One lymph node: negative for carcinoma. (0/1)      Past/Anticipated interventions by urology, if any: prostatectomy, Lupron (received in March) initiated Zytiga (started end of April), referral for salvage radiation therapy  Past/Anticipated interventions by medical oncology, if any: no  Weight changes, if any: no  Bowel/Bladder complaints, if any: Denies dysuria or hematuria. Reports post void dribble. Reports hot flashes related to ADT.   Nausea/Vomiting, if any: no  Pain issues, if any:  no  SAFETY ISSUES:  Prior radiation? no  Pacemaker/ICD? no  Possible current pregnancy? no  Is the patient on methotrexate? no  Current Complaints / other details:  62 year old male. Married with two children. Initially consulted by Dr. Tammi Klippel on 04/19/2016 but opted for a second opinion with [facility]. 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

## 2018-02-19 ENCOUNTER — Encounter: Payer: Self-pay | Admitting: Radiation Oncology

## 2018-02-19 ENCOUNTER — Ambulatory Visit
Admission: RE | Admit: 2018-02-19 | Discharge: 2018-02-19 | Disposition: A | Discharge status: Home / Self Care | Payer: BLUE CROSS/BLUE SHIELD | Source: Ambulatory Visit | Attending: Radiation Oncology | Admitting: Radiation Oncology

## 2018-02-19 ENCOUNTER — Other Ambulatory Visit: Payer: Self-pay

## 2018-02-19 ENCOUNTER — Telehealth: Payer: Self-pay | Admitting: Radiation Oncology

## 2018-02-19 VITALS — BP 117/88 | HR 69 | Temp 97.7°F | Resp 18 | Wt 210.6 lb

## 2018-02-19 DIAGNOSIS — C61 Malignant neoplasm of prostate: Secondary | ICD-10-CM

## 2018-02-19 DIAGNOSIS — Z7901 Long term (current) use of anticoagulants: Secondary | ICD-10-CM | POA: Diagnosis not present

## 2018-02-19 DIAGNOSIS — Z9079 Acquired absence of other genital organ(s): Secondary | ICD-10-CM | POA: Diagnosis not present

## 2018-02-19 DIAGNOSIS — R9721 Rising PSA following treatment for malignant neoplasm of prostate: Secondary | ICD-10-CM | POA: Diagnosis not present

## 2018-02-19 DIAGNOSIS — C84A0 Cutaneous T-cell lymphoma, unspecified, unspecified site: Secondary | ICD-10-CM | POA: Diagnosis not present

## 2018-02-19 DIAGNOSIS — Z923 Personal history of irradiation: Secondary | ICD-10-CM | POA: Diagnosis not present

## 2018-02-19 DIAGNOSIS — Z79899 Other long term (current) drug therapy: Secondary | ICD-10-CM | POA: Diagnosis not present

## 2018-02-19 NOTE — Progress Notes (Signed)
See progress note under physician encounter. 

## 2018-02-19 NOTE — Progress Notes (Signed)
Radiation Oncology         [phone]) [phone] ________________________________  Name: Kevin [name] MRN: [id number]  Date: 02/19/2018  DOB: [birth year]  Follow-Up New Outpatient Visit Note  CC: Josetta Huddle, MD  Lawanna Kobus, MD  Diagnosis:    62 y.o. gentleman with history of high grade Gleason 4+5 = 9 (pT2b) prostate cancer s/p radical prostatectomy (07/2016); now with evidence of biochemical recurrence with rising PSA of 0.15.    ICD-10-CM   1. Malignant neoplasm of prostate [facility]) C61      Narrative:  Mr. Weidler is here today for a follow up regarding recurrence of prostate cancer. He was initially diagnosed with Gleason 4+5=9, cT1c prostate adenocarcinoma with pretreatment PSA of 4.59 in 03/06/16. He had a bone scan on 03/23/16 revealing no evidence of metastatic disease. He met with Dr. Tammi Klippel in July 2017 to discuss radiotherapy options but ultimately decided to proceed with a radical prostatectomy on 07/18/2016 with Dr. Darcus Austin at [facility].  Final pathology confirmed Gleason 4+5=9 cancer in 16% of tissue, negaive SV, ECE, margins and 0/1 node involved (pT2bN0Mx). His initial postop PSA was undetectable on 11/01/16 but became detectable as of 06/07/17 when it was noted at 0.062.  An Axumin PET scan was performed on 09/19/2017 demonstrating no evidence of reoccurrence in the prostatectomy bed, abdomen pelvis or skeleton. He has continued under the care of Dr. Iona Beard in the Genitourinary clinic at [facility], and a repeat PSA 10/21/17 was further elevated at 0.15, prompting initiation of ADT.  He started Lupron in March 2019 and is tolerating this well.  A follow up PSA was obtained on 02/03/18 and had returned to undetectable. Zytiga was added to his systemic regimen at that time.  He reviewed PSA results with Dr. Darcus Austin and has been kindly referred to [place] to discuss salvage XRT treatment to the prostate fossa since he lives here in [place].   PSA 02/03/18        <0.01- On Lupron since 12/2017 10/21/2017     0.15 06/07/2017    0.062 11/01/2016    <0.01 02/2016          4.59 2016              3.94 2015              3.6          Of note, the patient has cutaneous lymphoma, which is well managed and controlled by dermatologist Dr. Irish Elders at [facility], with 3x weekly interferon, and oral SORIATANE. He is currently off these mesications since April 2019.  ALLERGIES:  has No Known Allergies.  Meds: Current Outpatient Medications  Medication Sig Dispense Refill  . abiraterone acetate (ZYTIGA) 250 MG tablet Take by mouth.    Marland Kitchen amLODipine-olmesartan (AZOR) 5-20 MG tablet Take by mouth.    . metroNIDAZOLE (METROGEL) 1 % gel Apply topically.    . milk thistle 175 MG tablet Take 175 mg by mouth daily.    . predniSONE (DELTASONE) 5 MG tablet Take by mouth.    . Psyllium (KONSYL-D) 52.3 % POWD Take by mouth.    . Red Yeast Rice Extract 600 MG CAPS Take 2 capsules by mouth daily.     . rivaroxaban (XARELTO) 20 MG TABS tablet Take by mouth.    Marland Kitchen acitretin (SORIATANE) 25 MG capsule TAKE ONE CAPSULE BY MOUTH EVERY DAY    . interferon alfa-2b (INTRON A) 6000000 UNIT/ML injection Inject 0.5 cc (3 Mu) three times a week subcutaneously    .  triamcinolone cream (KENALOG) 0.1 % Apply as directed--best would be daily x 2 weeks, one week off and repeat     No current facility-administered medications for this encounter.    REVIEW OF SYSTEMS:  On review of systems, the patient reports that he is doing well overall. He denies any chest pain, shortness of breath, cough, fevers, chills, night sweats, unintended weight changes. He denies any bowel disturbances, and denies abdominal pain, nausea or vomiting. He denies any new musculoskeletal or joint aches or pains. His IPSS was 0, indicating no urinary symptoms. He is able to complete sexual activity with almost all attempts. A complete review of systems is obtained and is otherwise negative.  Physical Findings: In general this is a well appearing Caucasian male in no acute  distress.  He is alert and oriented x4 and appropriate throughout the examination. HEENT reveals that the patient is normocephalic, atraumatic. EOMs are intact. PERRLA. Skin is intact, warm and dry with diffuse, red macular lesions that are pruritic. Cardiovascular exam reveals a regular rate and rhythm, no clicks rubs or murmurs are auscultated. Chest is clear to auscultation bilaterally. Lymphatic assessment is performed and does not reveal any adenopathy in the cervical, supraclavicular, axillary, or inguinal chains. Abdomen has active bowel sounds in all quadrants and is intact. The abdomen is soft, non tender, non distended. Lower extremities are negative for pretibial pitting edema, deep calf tenderness, cyanosis or clubbing.   KPS = 100  100 - Normal; no complaints; no evidence of disease. 90   - Able to carry on normal activity; minor signs or symptoms of disease. 80   - Normal activity with effort; some signs or symptoms of disease. 69   - Cares for self; unable to carry on normal activity or to do active work. 60   - Requires occasional assistance, but is able to care for most of his personal needs. 50   - Requires considerable assistance and frequent medical care. 65   - Disabled; requires special care and assistance. 26   - Severely disabled; hospital admission is indicated although death not imminent. 60   - Very sick; hospital admission necessary; active supportive treatment necessary. 10   - Moribund; fatal processes progressing rapidly. 0     - Dead  Karnofsky DA, Abelmann McDermott, Craver LS and Burchenal [facility] [phone]) The use of the nitrogen mustards in the palliative treatment of carcinoma: with particular reference to bronchogenic carcinoma Cancer 1 [phone]  Lab Findings: Lab Results  Component Value Date   WBC 12.9 (H) 01/24/2017   HGB 11.2 (L) 01/24/2017   HCT 31.6 (L) 01/24/2017   PLT 213 01/24/2017    Lab Results  Component Value Date   NA 137 01/24/2017   K 3.1 (L)  01/24/2017   CO2 25 01/24/2017   GLUCOSE 117 (H) 01/24/2017   BUN 15 01/24/2017   CREATININE 0.99 01/24/2017   CALCIUM 7.9 (L) 01/24/2017   ANIONGAP 8 01/24/2017    Radiographic Findings: No results found.  Impression/ Plan:  62 y.o. gentleman with history of high grade Gleason 4+5 = 9 (pT2b) prostate cancer s/p radical prostatectomy (07/2016); now with evidence of biochemical recurrence with rising PSA of 0.15.  Today, we talked to the patient and his wife about the findings and work-up thus far.  We personally reviewed the Axumin PET scan and past imaging with the patient. We discussed the natural history of prostate cancer.  We reviewed the prior pathology and PSA results and discussed the  implications of rising, detectable PSA following prostatectomy. We discussed the role of salvage radiotherapy in the setting of biochemical recurrence following prostatectomy.  We discussed radiation treatment directed to the prostatic fossa with regard to the logistics and delivery of external beam radiation treatment.  We discussed the risks and benefits as well and short and long-term side effects associated with salvage radiotherapy.  The patient has already started Lupron for ADT in March 2019 and is tolerating this well.  Additionally, he started Zytiga  2 weeks ago and appears to be tolerating this.  He appears to have hormone sensitive disease with repeat PSA on 02/03/18 undetectable. The recommendation is to proceed with salvage radiotherapy to the prostate fossa delivered over the course of 7-1/2 weeks of daily treatment in combination with his current systemic treatment.  The patient was encouraged to ask questions that were answered to his stated satisfaction.  At the conclusion of our conversation, the patient elects to proceed with salvage radiotherapy to the pelvic lymph nodes and prostate fossa.  He is scheduled for CT simulation/planning on Friday, May 17 at 10 AM in anticipation of beginning  treatments the week of 03/03/18. We will share our findings with Dr. Iona Beard and will look forward to participating in his care.    In anticipation of his upcoming radiation treatments, we have recommended that Mr. Faller discuss what to do with his Interferon and Soriatane medications during treatment with Dr. Irish Elders to avoid potential side effects with increased skin/tissue sensitivity. Patient has a follow up appointment with Dr. Irish Elders scheduled for 02/24/2018.    We spent more than 50% of today's visit in counseling and/or coordination of care.  _____________________________________   Nicholos Johns, PA-C    Tyler Pita, MD  [place]: [phone]  Fax: [phone] [email or web address]  Skype  [email or web address]    This document serves as a record of services personally performed by Tyler Pita, MD and Ashlyn Bruning PA-C. It was created on their behalf by Delton Coombes, a trained medical scribe. The creation of this record is based on the scribe's personal observations and the provider's statements to them.

## 2018-02-19 NOTE — Telephone Encounter (Signed)
Patient hasn't appeared for 0900 appointment. Phoned patient's cell to inquire. No answer. Left message requesting return call.

## 2018-02-20 ENCOUNTER — Encounter: Payer: Self-pay | Admitting: Medical Oncology

## 2018-02-21 ENCOUNTER — Ambulatory Visit
Admission: RE | Admit: 2018-02-21 | Discharge: 2018-02-21 | Disposition: A | Discharge status: Home / Self Care | Payer: BLUE CROSS/BLUE SHIELD | Source: Ambulatory Visit | Attending: Radiation Oncology | Admitting: Radiation Oncology

## 2018-02-21 ENCOUNTER — Encounter: Payer: Self-pay | Admitting: Medical Oncology

## 2018-02-21 DIAGNOSIS — Z510 Encounter for antineoplastic radiation therapy: Secondary | ICD-10-CM | POA: Insufficient documentation

## 2018-02-21 DIAGNOSIS — C61 Malignant neoplasm of prostate: Secondary | ICD-10-CM | POA: Diagnosis not present

## 2018-02-23 NOTE — Progress Notes (Signed)
  Radiation Oncology         [phone]) [phone] ________________________________  Name: Kevin [name] MRN: [id number]  Date: 02/21/2018  DOB: [birth year]  SIMULATION AND TREATMENT PLANNING NOTE    ICD-10-CM   1. Malignant neoplasm of prostate ([place]) C61     DIAGNOSIS:   62 y.o. gentleman with history of high grade Gleason 4+5 = 9 (pT2b) prostate cancer s/p radical prostatectomy (07/2016); now with evidence of biochemical recurrence with rising PSA of 0.15.  NARRATIVE:  The patient was brought to the [facility].  Identity was confirmed.  All relevant records and images related to the planned course of therapy were reviewed.  The patient freely provided informed written consent to proceed with treatment after reviewing the details related to the planned course of therapy. The consent form was witnessed and verified by the simulation staff.  Then, the patient was set-up in a stable reproducible supine position for radiation therapy.  A vacuum lock pillow device was custom fabricated to position his legs in a reproducible immobilized position.  Then, I performed a urethrogram under sterile conditions to identify the prostatic bed.  CT images were obtained.  Surface markings were placed.  The CT images were loaded into the planning software.  Then the prostate bed target, pelvic lymph node target and avoidance structures including the rectum, bladder, bowel and hips were contoured.  Treatment planning then occurred.  The radiation prescription was entered and confirmed.  A total of one complex treatment devices were fabricated. I have requested : Intensity Modulated Radiotherapy (IMRT) is medically necessary for this case for the following reason:  Rectal sparing.Marland Kitchen  PLAN:  The patient will receive 45 Gy in 25 fractions of 1.8 Gy, followed by a boost to the prostate bed to a total dose of 68.4 Gy with 13 additional fractions of 1.8 Gy.   ________________________________  Sheral Apley Tammi Klippel,  M.D.

## 2018-02-24 DIAGNOSIS — C8400 Mycosis fungoides, unspecified site: Secondary | ICD-10-CM | POA: Diagnosis not present

## 2018-02-28 DIAGNOSIS — C61 Malignant neoplasm of prostate: Secondary | ICD-10-CM | POA: Diagnosis not present

## 2018-02-28 DIAGNOSIS — Z510 Encounter for antineoplastic radiation therapy: Secondary | ICD-10-CM | POA: Diagnosis not present

## 2018-03-04 ENCOUNTER — Ambulatory Visit
Admission: RE | Admit: 2018-03-04 | Discharge: 2018-03-04 | Disposition: A | Discharge status: Home / Self Care | Payer: BLUE CROSS/BLUE SHIELD | Source: Ambulatory Visit | Attending: Radiation Oncology | Admitting: Radiation Oncology

## 2018-03-04 DIAGNOSIS — C61 Malignant neoplasm of prostate: Secondary | ICD-10-CM | POA: Diagnosis not present

## 2018-03-04 DIAGNOSIS — Z510 Encounter for antineoplastic radiation therapy: Secondary | ICD-10-CM | POA: Diagnosis not present

## 2018-03-05 ENCOUNTER — Ambulatory Visit
Admission: RE | Admit: 2018-03-05 | Discharge: 2018-03-05 | Disposition: A | Discharge status: Home / Self Care | Payer: BLUE CROSS/BLUE SHIELD | Source: Ambulatory Visit | Attending: Radiation Oncology | Admitting: Radiation Oncology

## 2018-03-05 DIAGNOSIS — Z510 Encounter for antineoplastic radiation therapy: Secondary | ICD-10-CM | POA: Diagnosis not present

## 2018-03-05 DIAGNOSIS — C61 Malignant neoplasm of prostate: Secondary | ICD-10-CM | POA: Diagnosis not present

## 2018-03-06 ENCOUNTER — Ambulatory Visit
Admission: RE | Admit: 2018-03-06 | Discharge: 2018-03-06 | Disposition: A | Discharge status: Home / Self Care | Payer: BLUE CROSS/BLUE SHIELD | Source: Ambulatory Visit | Attending: Radiation Oncology | Admitting: Radiation Oncology

## 2018-03-06 DIAGNOSIS — Z510 Encounter for antineoplastic radiation therapy: Secondary | ICD-10-CM | POA: Diagnosis not present

## 2018-03-06 DIAGNOSIS — C61 Malignant neoplasm of prostate: Secondary | ICD-10-CM | POA: Diagnosis not present

## 2018-03-07 ENCOUNTER — Ambulatory Visit
Admission: RE | Admit: 2018-03-07 | Discharge: 2018-03-07 | Disposition: A | Discharge status: Home / Self Care | Payer: BLUE CROSS/BLUE SHIELD | Source: Ambulatory Visit | Attending: Radiation Oncology | Admitting: Radiation Oncology

## 2018-03-07 DIAGNOSIS — Z510 Encounter for antineoplastic radiation therapy: Secondary | ICD-10-CM | POA: Diagnosis not present

## 2018-03-07 DIAGNOSIS — C61 Malignant neoplasm of prostate: Secondary | ICD-10-CM | POA: Diagnosis not present

## 2018-03-10 ENCOUNTER — Ambulatory Visit
Admission: RE | Admit: 2018-03-10 | Discharge: 2018-03-10 | Disposition: A | Discharge status: Home / Self Care | Payer: BLUE CROSS/BLUE SHIELD | Source: Ambulatory Visit | Attending: Radiation Oncology | Admitting: Radiation Oncology

## 2018-03-10 DIAGNOSIS — Z510 Encounter for antineoplastic radiation therapy: Secondary | ICD-10-CM | POA: Insufficient documentation

## 2018-03-10 DIAGNOSIS — C61 Malignant neoplasm of prostate: Secondary | ICD-10-CM | POA: Diagnosis not present

## 2018-03-11 ENCOUNTER — Ambulatory Visit
Admission: RE | Admit: 2018-03-11 | Discharge: 2018-03-11 | Disposition: A | Discharge status: Home / Self Care | Payer: BLUE CROSS/BLUE SHIELD | Source: Ambulatory Visit | Attending: Radiation Oncology | Admitting: Radiation Oncology

## 2018-03-11 DIAGNOSIS — C61 Malignant neoplasm of prostate: Secondary | ICD-10-CM | POA: Diagnosis not present

## 2018-03-11 DIAGNOSIS — Z510 Encounter for antineoplastic radiation therapy: Secondary | ICD-10-CM | POA: Diagnosis not present

## 2018-03-12 ENCOUNTER — Ambulatory Visit
Admission: RE | Admit: 2018-03-12 | Discharge: 2018-03-12 | Disposition: A | Discharge status: Home / Self Care | Payer: BLUE CROSS/BLUE SHIELD | Source: Ambulatory Visit | Attending: Radiation Oncology | Admitting: Radiation Oncology

## 2018-03-12 DIAGNOSIS — Z510 Encounter for antineoplastic radiation therapy: Secondary | ICD-10-CM | POA: Diagnosis not present

## 2018-03-12 DIAGNOSIS — C61 Malignant neoplasm of prostate: Secondary | ICD-10-CM | POA: Diagnosis not present

## 2018-03-13 ENCOUNTER — Ambulatory Visit
Admission: RE | Admit: 2018-03-13 | Discharge: 2018-03-13 | Disposition: A | Discharge status: Home / Self Care | Payer: BLUE CROSS/BLUE SHIELD | Source: Ambulatory Visit | Attending: Radiation Oncology | Admitting: Radiation Oncology

## 2018-03-13 DIAGNOSIS — Z510 Encounter for antineoplastic radiation therapy: Secondary | ICD-10-CM | POA: Diagnosis not present

## 2018-03-13 DIAGNOSIS — C61 Malignant neoplasm of prostate: Secondary | ICD-10-CM | POA: Diagnosis not present

## 2018-03-14 ENCOUNTER — Ambulatory Visit
Admission: RE | Admit: 2018-03-14 | Discharge: 2018-03-14 | Disposition: A | Discharge status: Home / Self Care | Payer: BLUE CROSS/BLUE SHIELD | Source: Ambulatory Visit | Attending: Radiation Oncology | Admitting: Radiation Oncology

## 2018-03-14 DIAGNOSIS — Z510 Encounter for antineoplastic radiation therapy: Secondary | ICD-10-CM | POA: Diagnosis not present

## 2018-03-14 DIAGNOSIS — C61 Malignant neoplasm of prostate: Secondary | ICD-10-CM | POA: Diagnosis not present

## 2018-03-17 ENCOUNTER — Ambulatory Visit
Admission: RE | Admit: 2018-03-17 | Discharge: 2018-03-17 | Disposition: A | Discharge status: Home / Self Care | Payer: BLUE CROSS/BLUE SHIELD | Source: Ambulatory Visit | Attending: Radiation Oncology | Admitting: Radiation Oncology

## 2018-03-17 DIAGNOSIS — C61 Malignant neoplasm of prostate: Secondary | ICD-10-CM | POA: Diagnosis not present

## 2018-03-17 DIAGNOSIS — Z510 Encounter for antineoplastic radiation therapy: Secondary | ICD-10-CM | POA: Diagnosis not present

## 2018-03-18 ENCOUNTER — Ambulatory Visit
Admission: RE | Admit: 2018-03-18 | Discharge: 2018-03-18 | Disposition: A | Discharge status: Home / Self Care | Payer: BLUE CROSS/BLUE SHIELD | Source: Ambulatory Visit | Attending: Radiation Oncology | Admitting: Radiation Oncology

## 2018-03-18 DIAGNOSIS — C61 Malignant neoplasm of prostate: Secondary | ICD-10-CM | POA: Diagnosis not present

## 2018-03-18 DIAGNOSIS — Z510 Encounter for antineoplastic radiation therapy: Secondary | ICD-10-CM | POA: Diagnosis not present

## 2018-03-19 ENCOUNTER — Ambulatory Visit
Admission: RE | Admit: 2018-03-19 | Discharge: 2018-03-19 | Disposition: A | Discharge status: Home / Self Care | Payer: BLUE CROSS/BLUE SHIELD | Source: Ambulatory Visit | Attending: Radiation Oncology | Admitting: Radiation Oncology

## 2018-03-19 DIAGNOSIS — Z510 Encounter for antineoplastic radiation therapy: Secondary | ICD-10-CM | POA: Diagnosis not present

## 2018-03-19 DIAGNOSIS — C61 Malignant neoplasm of prostate: Secondary | ICD-10-CM | POA: Diagnosis not present

## 2018-03-20 ENCOUNTER — Ambulatory Visit
Admission: RE | Admit: 2018-03-20 | Discharge: 2018-03-20 | Disposition: A | Discharge status: Home / Self Care | Payer: BLUE CROSS/BLUE SHIELD | Source: Ambulatory Visit | Attending: Radiation Oncology | Admitting: Radiation Oncology

## 2018-03-20 DIAGNOSIS — C61 Malignant neoplasm of prostate: Secondary | ICD-10-CM | POA: Diagnosis not present

## 2018-03-20 DIAGNOSIS — Z510 Encounter for antineoplastic radiation therapy: Secondary | ICD-10-CM | POA: Diagnosis not present

## 2018-03-21 ENCOUNTER — Ambulatory Visit
Admission: RE | Admit: 2018-03-21 | Discharge: 2018-03-21 | Disposition: A | Discharge status: Home / Self Care | Payer: BLUE CROSS/BLUE SHIELD | Source: Ambulatory Visit | Attending: Radiation Oncology | Admitting: Radiation Oncology

## 2018-03-21 DIAGNOSIS — C61 Malignant neoplasm of prostate: Secondary | ICD-10-CM | POA: Diagnosis not present

## 2018-03-21 DIAGNOSIS — Z510 Encounter for antineoplastic radiation therapy: Secondary | ICD-10-CM | POA: Diagnosis not present

## 2018-03-24 ENCOUNTER — Ambulatory Visit
Admission: RE | Admit: 2018-03-24 | Discharge: 2018-03-24 | Disposition: A | Discharge status: Home / Self Care | Payer: BLUE CROSS/BLUE SHIELD | Source: Ambulatory Visit | Attending: Radiation Oncology | Admitting: Radiation Oncology

## 2018-03-24 ENCOUNTER — Encounter: Payer: Self-pay | Admitting: Medical Oncology

## 2018-03-24 DIAGNOSIS — Z510 Encounter for antineoplastic radiation therapy: Secondary | ICD-10-CM | POA: Diagnosis not present

## 2018-03-24 DIAGNOSIS — C61 Malignant neoplasm of prostate: Secondary | ICD-10-CM | POA: Diagnosis not present

## 2018-03-25 ENCOUNTER — Ambulatory Visit
Admission: RE | Admit: 2018-03-25 | Discharge: 2018-03-25 | Disposition: A | Discharge status: Home / Self Care | Payer: BLUE CROSS/BLUE SHIELD | Source: Ambulatory Visit | Attending: Radiation Oncology | Admitting: Radiation Oncology

## 2018-03-25 DIAGNOSIS — Z510 Encounter for antineoplastic radiation therapy: Secondary | ICD-10-CM | POA: Diagnosis not present

## 2018-03-25 DIAGNOSIS — C61 Malignant neoplasm of prostate: Secondary | ICD-10-CM | POA: Diagnosis not present

## 2018-03-26 ENCOUNTER — Other Ambulatory Visit: Payer: Self-pay | Admitting: Urology

## 2018-03-26 ENCOUNTER — Ambulatory Visit
Admission: RE | Admit: 2018-03-26 | Discharge: 2018-03-26 | Disposition: A | Discharge status: Home / Self Care | Payer: BLUE CROSS/BLUE SHIELD | Source: Ambulatory Visit | Attending: Radiation Oncology | Admitting: Radiation Oncology

## 2018-03-26 DIAGNOSIS — Z510 Encounter for antineoplastic radiation therapy: Secondary | ICD-10-CM | POA: Diagnosis not present

## 2018-03-26 DIAGNOSIS — C61 Malignant neoplasm of prostate: Secondary | ICD-10-CM

## 2018-03-26 DIAGNOSIS — L0889 Other specified local infections of the skin and subcutaneous tissue: Secondary | ICD-10-CM | POA: Diagnosis not present

## 2018-03-26 DIAGNOSIS — L738 Other specified follicular disorders: Secondary | ICD-10-CM | POA: Diagnosis not present

## 2018-03-26 MED ORDER — HYDROCORTISONE ACE-PRAMOXINE 1-1 % RE FOAM: 1.0000 | Freq: Two times a day (BID) | RECTAL | 0 refills | Status: DC

## 2018-03-26 NOTE — Progress Notes (Signed)
Pt came in today with rectal pain. Pt states that the pain is a 7 out of 10 when having liquid stools and a 9 out of 10 when having solid stools. Pt states that it is a stabbing pain that is in between the inside and outside of his rectum. Pt states that he is taking tylenol for pain. Pt denies having blood in his stool. Pt states that pressure relieves that pain. Pt states that when he is in the sitting position it feels good. Pt states that his last bowel movement was this morning and it was loose. Pt denies having any dysuria or hematuria.    Pt was seen by Ashlyn B  PA and was advised that procto foam was called into the pharmacy. Pt was also given a sitz bath for additional relief. Pt was advised on how to use the proto foam and educated on how to use the sitz bath. Pt verbalized understanding on both. Pt will be seen again on Friday for an under treat and will be re evaluated at that point.

## 2018-03-27 ENCOUNTER — Other Ambulatory Visit: Payer: Self-pay | Admitting: Urology

## 2018-03-27 ENCOUNTER — Telehealth: Payer: Self-pay | Admitting: Radiation Oncology

## 2018-03-27 ENCOUNTER — Ambulatory Visit
Admission: RE | Admit: 2018-03-27 | Discharge: 2018-03-27 | Disposition: A | Discharge status: Home / Self Care | Payer: BLUE CROSS/BLUE SHIELD | Source: Ambulatory Visit | Attending: Radiation Oncology | Admitting: Radiation Oncology

## 2018-03-27 DIAGNOSIS — C61 Malignant neoplasm of prostate: Secondary | ICD-10-CM | POA: Diagnosis not present

## 2018-03-27 DIAGNOSIS — Z510 Encounter for antineoplastic radiation therapy: Secondary | ICD-10-CM | POA: Diagnosis not present

## 2018-03-27 MED ORDER — HYDROCORTISONE 2.5 % RE CREA: 1.0000 "application " | TOPICAL_CREAM | Freq: Two times a day (BID) | RECTAL | 0 refills | Status: DC

## 2018-03-27 NOTE — Telephone Encounter (Signed)
Phoned patient's cell. Explained I received a fax from his pharmacy that his insurance wouldn't cover the cost of the Proctofoam escribed yesterday. Explained that [employer], PA-C cancelled the original script and escribed Proctozone HC 2.5 % which the pharmacist was confident his insurance would cover. Explained the directions for use are the same. Patient verbalized understanding and expressed appreciation for all the help

## 2018-03-28 ENCOUNTER — Ambulatory Visit
Admission: RE | Admit: 2018-03-28 | Discharge: 2018-03-28 | Disposition: A | Discharge status: Home / Self Care | Payer: BLUE CROSS/BLUE SHIELD | Source: Ambulatory Visit | Attending: Radiation Oncology | Admitting: Radiation Oncology

## 2018-03-28 DIAGNOSIS — C61 Malignant neoplasm of prostate: Secondary | ICD-10-CM | POA: Diagnosis not present

## 2018-03-28 DIAGNOSIS — Z510 Encounter for antineoplastic radiation therapy: Secondary | ICD-10-CM | POA: Diagnosis not present

## 2018-03-31 ENCOUNTER — Ambulatory Visit
Admission: RE | Admit: 2018-03-31 | Discharge: 2018-03-31 | Disposition: A | Discharge status: Home / Self Care | Payer: BLUE CROSS/BLUE SHIELD | Source: Ambulatory Visit | Attending: Radiation Oncology | Admitting: Radiation Oncology

## 2018-03-31 DIAGNOSIS — C61 Malignant neoplasm of prostate: Secondary | ICD-10-CM | POA: Diagnosis not present

## 2018-03-31 DIAGNOSIS — Z510 Encounter for antineoplastic radiation therapy: Secondary | ICD-10-CM | POA: Diagnosis not present

## 2018-04-01 ENCOUNTER — Ambulatory Visit
Admission: RE | Admit: 2018-04-01 | Discharge: 2018-04-01 | Disposition: A | Discharge status: Home / Self Care | Payer: BLUE CROSS/BLUE SHIELD | Source: Ambulatory Visit | Attending: Radiation Oncology | Admitting: Radiation Oncology

## 2018-04-01 DIAGNOSIS — C61 Malignant neoplasm of prostate: Secondary | ICD-10-CM | POA: Diagnosis not present

## 2018-04-01 DIAGNOSIS — Z510 Encounter for antineoplastic radiation therapy: Secondary | ICD-10-CM | POA: Diagnosis not present

## 2018-04-02 ENCOUNTER — Ambulatory Visit
Admission: RE | Admit: 2018-04-02 | Discharge: 2018-04-02 | Disposition: A | Discharge status: Home / Self Care | Payer: BLUE CROSS/BLUE SHIELD | Source: Ambulatory Visit | Attending: Radiation Oncology | Admitting: Radiation Oncology

## 2018-04-02 DIAGNOSIS — Z510 Encounter for antineoplastic radiation therapy: Secondary | ICD-10-CM | POA: Diagnosis not present

## 2018-04-02 DIAGNOSIS — C61 Malignant neoplasm of prostate: Secondary | ICD-10-CM | POA: Diagnosis not present

## 2018-04-03 ENCOUNTER — Ambulatory Visit: Payer: BLUE CROSS/BLUE SHIELD

## 2018-04-04 ENCOUNTER — Ambulatory Visit: Payer: BLUE CROSS/BLUE SHIELD

## 2018-04-07 ENCOUNTER — Ambulatory Visit
Admission: RE | Admit: 2018-04-07 | Discharge: 2018-04-07 | Disposition: A | Discharge status: Home / Self Care | Payer: BLUE CROSS/BLUE SHIELD | Source: Ambulatory Visit | Attending: Radiation Oncology | Admitting: Radiation Oncology

## 2018-04-07 DIAGNOSIS — C61 Malignant neoplasm of prostate: Secondary | ICD-10-CM | POA: Insufficient documentation

## 2018-04-07 DIAGNOSIS — Z510 Encounter for antineoplastic radiation therapy: Secondary | ICD-10-CM | POA: Diagnosis not present

## 2018-04-08 ENCOUNTER — Ambulatory Visit: Payer: BLUE CROSS/BLUE SHIELD

## 2018-04-08 ENCOUNTER — Ambulatory Visit
Admission: RE | Admit: 2018-04-08 | Discharge: 2018-04-08 | Disposition: A | Discharge status: Home / Self Care | Payer: BLUE CROSS/BLUE SHIELD | Source: Ambulatory Visit | Attending: Radiation Oncology | Admitting: Radiation Oncology

## 2018-04-08 DIAGNOSIS — L308 Other specified dermatitis: Secondary | ICD-10-CM | POA: Diagnosis not present

## 2018-04-08 DIAGNOSIS — C61 Malignant neoplasm of prostate: Secondary | ICD-10-CM | POA: Diagnosis not present

## 2018-04-08 DIAGNOSIS — L738 Other specified follicular disorders: Secondary | ICD-10-CM | POA: Diagnosis not present

## 2018-04-08 DIAGNOSIS — Z510 Encounter for antineoplastic radiation therapy: Secondary | ICD-10-CM | POA: Diagnosis not present

## 2018-04-09 ENCOUNTER — Ambulatory Visit: Payer: BLUE CROSS/BLUE SHIELD

## 2018-04-09 ENCOUNTER — Ambulatory Visit
Admission: RE | Admit: 2018-04-09 | Discharge: 2018-04-09 | Disposition: A | Discharge status: Home / Self Care | Payer: BLUE CROSS/BLUE SHIELD | Source: Ambulatory Visit | Attending: Radiation Oncology | Admitting: Radiation Oncology

## 2018-04-09 DIAGNOSIS — C61 Malignant neoplasm of prostate: Secondary | ICD-10-CM | POA: Diagnosis not present

## 2018-04-09 DIAGNOSIS — Z510 Encounter for antineoplastic radiation therapy: Secondary | ICD-10-CM | POA: Diagnosis not present

## 2018-04-10 IMAGING — PT NM PET NOPR SKULL BASE TO THIGH
7 series · 25 of 25 positions shown · non-contrast
Comparison: CT 4 12 18

CLINICAL DATA: Prostate carcinoma with biochemical recurrence.
Prostatectomy 8014. Oral chemotherapy ongoing. PSA equal
(06/07/2017

EXAM:
NUCLEAR MEDICINE PET SKULL BASE TO THIGH
TECHNIQUE: 9.9 mCi F-18 Fluciclovine was injected intravenously. Full-ring PET
imaging was performed from the skull base to thigh after the
radiotracer. CT data was obtained and used for attenuation
correction and anatomic localization.

[Series 3: pet sk_thigh ac · axial · 5.0mm · 4.07mm/px · z∈[+860,+1820]mm · 6 of 241 slices shown]
[im 1/241]
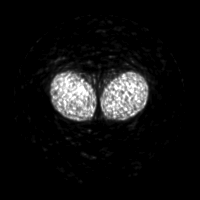
[im 49/241]
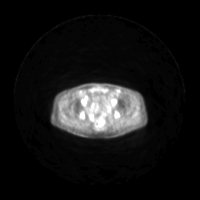
[im 97/241]
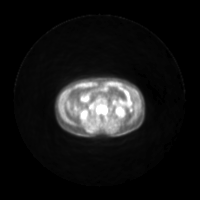
[im 145/241]
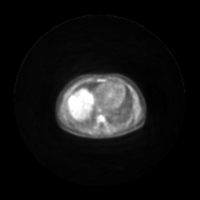
[im 193/241]
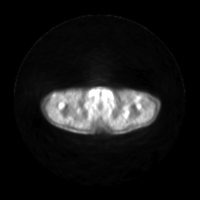
[im 241/241]
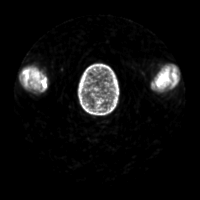

[Series 4: ct sk_thigh 5.0 b31f · axial · 5.0mm · 0.98mm/px · z∈[+860,+1820]mm · 6 of 241 slices shown]
[im 1/241]
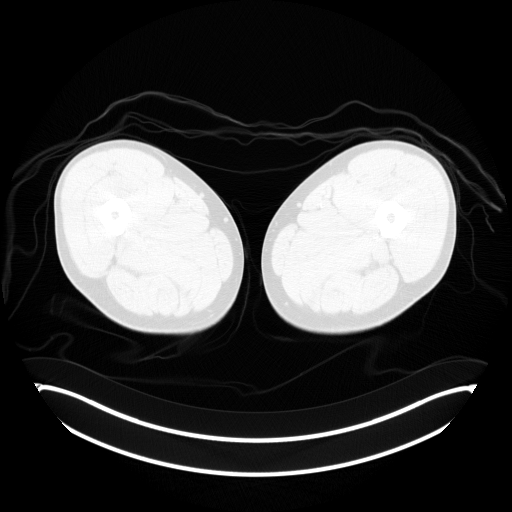
[im 49/241]
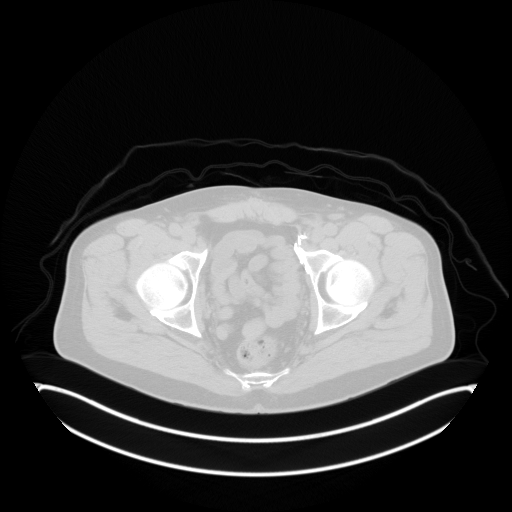
[im 97/241]
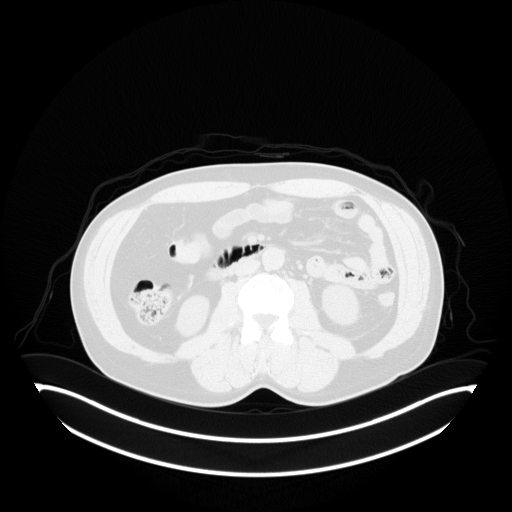
[im 145/241]
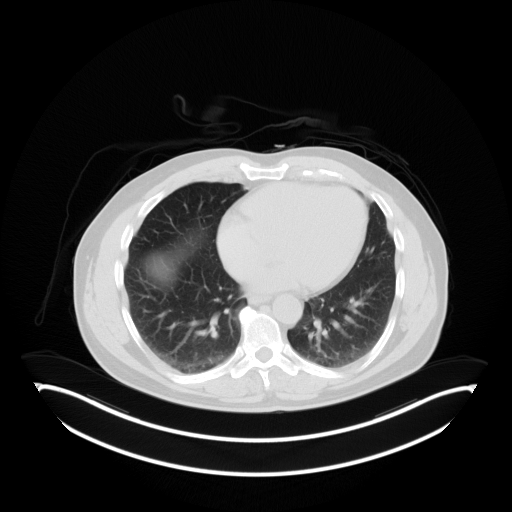
[im 193/241]
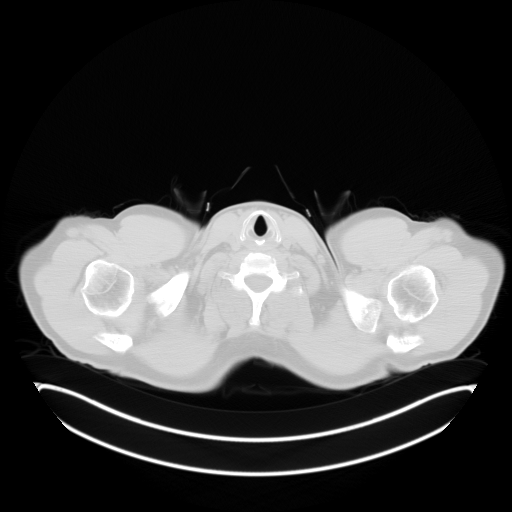
[im 241/241  brain]
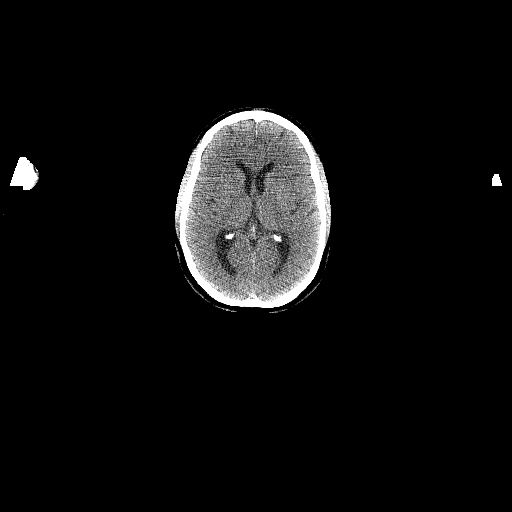

[Series 5: pet sk_thigh nac · axial · 5.0mm · 4.07mm/px · z∈[+860,+1820]mm · 5 of 241 slices shown]
[im 1/241]
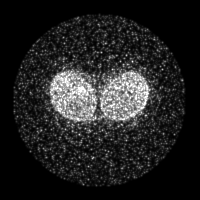
[im 61/241]
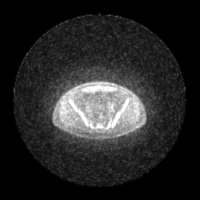
[im 121/241]
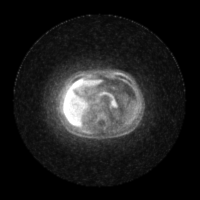
[im 181/241]
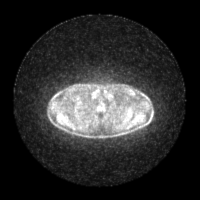
[im 241/241]
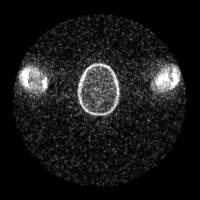

[Series 8: ct sk_thigh 5.0 b70f (id)_bone · axial · 5.0mm · 0.64mm/px · 1 of 62 slices shown]
[im 1/62  bone]
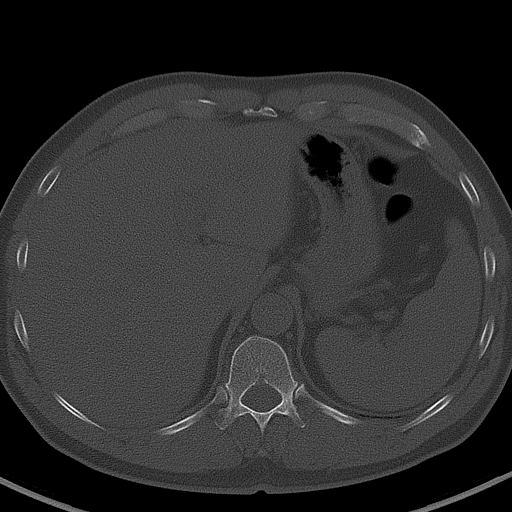

[Series 603: range-ct sk_thigh 5.0 (id)<alpha range> · 1 of 53 slices shown (1 of 2)]
[im 1/53]
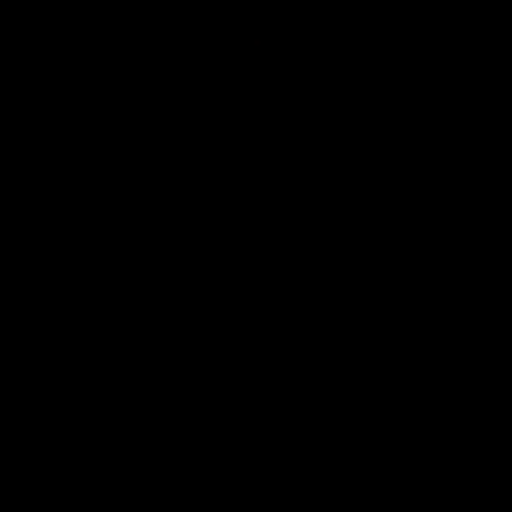

[Series 604: mip range · coronal · 1.99mm/px · 1 of 32 slices shown]
[im 1/32]
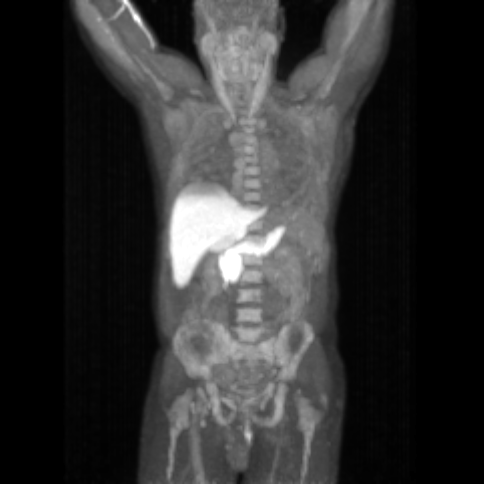

[Series 605: range-ct sk_thigh 5.0 (id)<alpha range> · 5 of 232 slices shown (2 of 2)]
[im 1/232]
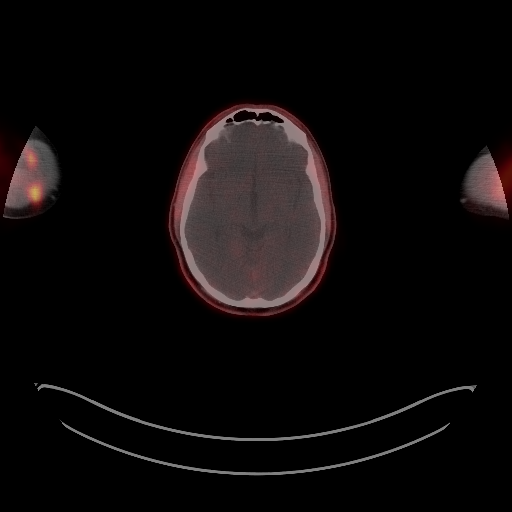
[im 58/232]
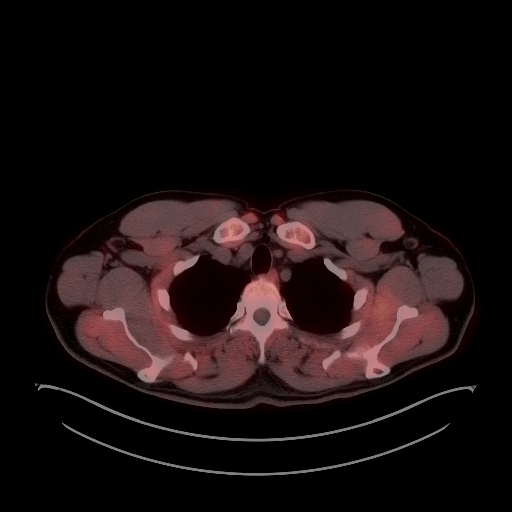
[im 116/232]
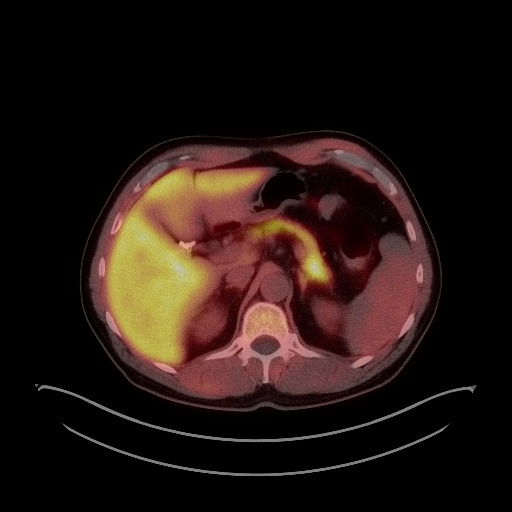
[im 174/232]
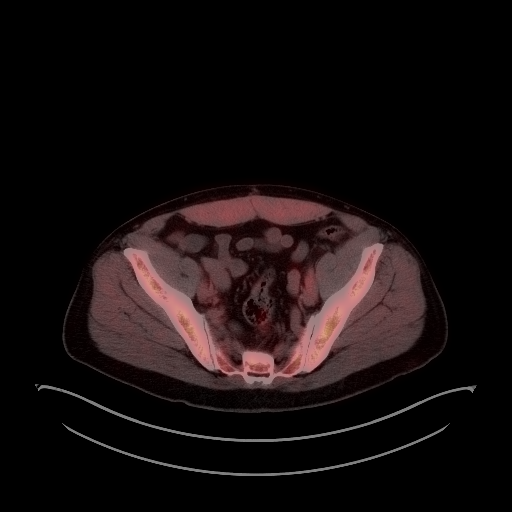
[im 232/232]
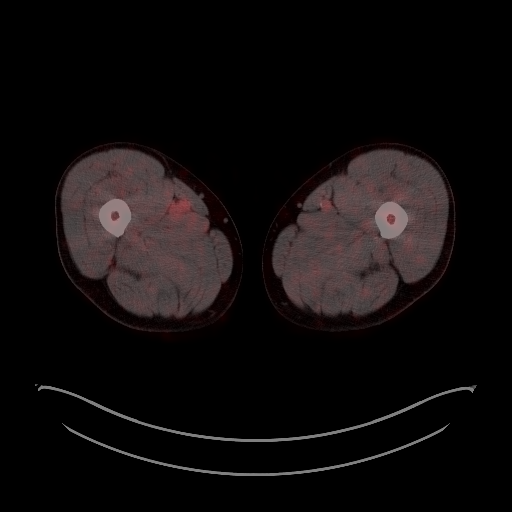

[25 of 25 positions shown; findings below may reference images not displayed]

FINDINGS: NECK

No radiotracer activity in neck lymph nodes.

CHEST

No radiotracer accumulation within mediastinal or hilar lymph nodes.
No suspicious pulmonary nodules on the CT scan.

ABDOMEN/PELVIS

No focal radiotracer activity within the prostate bed.

No radiotracer activity within pelvic lymph nodes. No radiotracer
activity in periaortic lymph nodes. No enlarged lymph nodes
identified.

Mild radiotracer activity within inguinal lymph nodes is favored
reactive/inflammatory

Physiologic activity noted within the liver and pancreas.

SKELETON

No focal activity to suggest skeletal metastasis. Diffuse
radiotracer uptake within the pelvis without focality. No lytic or
sclerotic lesions on CT exam.
IMPRESSION: 1. No evidence of local prostate cancer recurrence the prostatectomy
bed.
2. No evidence of metastatic adenopathy in the abdomen pelvis.
3. No evidence of skeletal metastasis

## 2018-04-11 ENCOUNTER — Ambulatory Visit
Admission: RE | Admit: 2018-04-11 | Discharge: 2018-04-11 | Disposition: A | Discharge status: Home / Self Care | Payer: BLUE CROSS/BLUE SHIELD | Source: Ambulatory Visit | Attending: Radiation Oncology | Admitting: Radiation Oncology

## 2018-04-11 ENCOUNTER — Encounter: Payer: Self-pay | Admitting: Medical Oncology

## 2018-04-11 DIAGNOSIS — Z510 Encounter for antineoplastic radiation therapy: Secondary | ICD-10-CM | POA: Diagnosis not present

## 2018-04-11 DIAGNOSIS — C61 Malignant neoplasm of prostate: Secondary | ICD-10-CM | POA: Diagnosis not present

## 2018-04-14 ENCOUNTER — Ambulatory Visit
Admission: RE | Admit: 2018-04-14 | Discharge: 2018-04-14 | Disposition: A | Discharge status: Home / Self Care | Payer: BLUE CROSS/BLUE SHIELD | Source: Ambulatory Visit | Attending: Radiation Oncology | Admitting: Radiation Oncology

## 2018-04-14 DIAGNOSIS — Z510 Encounter for antineoplastic radiation therapy: Secondary | ICD-10-CM | POA: Diagnosis not present

## 2018-04-14 DIAGNOSIS — C61 Malignant neoplasm of prostate: Secondary | ICD-10-CM | POA: Diagnosis not present

## 2018-04-15 ENCOUNTER — Ambulatory Visit
Admission: RE | Admit: 2018-04-15 | Discharge: 2018-04-15 | Disposition: A | Discharge status: Home / Self Care | Payer: BLUE CROSS/BLUE SHIELD | Source: Ambulatory Visit | Attending: Radiation Oncology | Admitting: Radiation Oncology

## 2018-04-15 DIAGNOSIS — Z510 Encounter for antineoplastic radiation therapy: Secondary | ICD-10-CM | POA: Diagnosis not present

## 2018-04-15 DIAGNOSIS — C61 Malignant neoplasm of prostate: Secondary | ICD-10-CM | POA: Diagnosis not present

## 2018-04-16 ENCOUNTER — Ambulatory Visit
Admission: RE | Admit: 2018-04-16 | Discharge: 2018-04-16 | Disposition: A | Discharge status: Home / Self Care | Payer: BLUE CROSS/BLUE SHIELD | Source: Ambulatory Visit | Attending: Radiation Oncology | Admitting: Radiation Oncology

## 2018-04-16 DIAGNOSIS — Z510 Encounter for antineoplastic radiation therapy: Secondary | ICD-10-CM | POA: Diagnosis not present

## 2018-04-16 DIAGNOSIS — C61 Malignant neoplasm of prostate: Secondary | ICD-10-CM | POA: Diagnosis not present

## 2018-04-17 ENCOUNTER — Ambulatory Visit
Admission: RE | Admit: 2018-04-17 | Discharge: 2018-04-17 | Disposition: A | Discharge status: Home / Self Care | Payer: BLUE CROSS/BLUE SHIELD | Source: Ambulatory Visit | Attending: Radiation Oncology | Admitting: Radiation Oncology

## 2018-04-17 DIAGNOSIS — C61 Malignant neoplasm of prostate: Secondary | ICD-10-CM | POA: Diagnosis not present

## 2018-04-17 DIAGNOSIS — Z510 Encounter for antineoplastic radiation therapy: Secondary | ICD-10-CM | POA: Diagnosis not present

## 2018-04-18 ENCOUNTER — Ambulatory Visit: Payer: BLUE CROSS/BLUE SHIELD

## 2018-04-21 ENCOUNTER — Ambulatory Visit
Admission: RE | Admit: 2018-04-21 | Discharge: 2018-04-21 | Disposition: A | Discharge status: Home / Self Care | Payer: BLUE CROSS/BLUE SHIELD | Source: Ambulatory Visit | Attending: Radiation Oncology | Admitting: Radiation Oncology

## 2018-04-21 DIAGNOSIS — Z510 Encounter for antineoplastic radiation therapy: Secondary | ICD-10-CM | POA: Diagnosis not present

## 2018-04-21 DIAGNOSIS — C61 Malignant neoplasm of prostate: Secondary | ICD-10-CM | POA: Diagnosis not present

## 2018-04-22 ENCOUNTER — Ambulatory Visit
Admission: RE | Admit: 2018-04-22 | Discharge: 2018-04-22 | Disposition: A | Discharge status: Home / Self Care | Payer: BLUE CROSS/BLUE SHIELD | Source: Ambulatory Visit | Attending: Radiation Oncology | Admitting: Radiation Oncology

## 2018-04-22 DIAGNOSIS — Z510 Encounter for antineoplastic radiation therapy: Secondary | ICD-10-CM | POA: Diagnosis not present

## 2018-04-22 DIAGNOSIS — C61 Malignant neoplasm of prostate: Secondary | ICD-10-CM | POA: Diagnosis not present

## 2018-04-23 ENCOUNTER — Ambulatory Visit
Admission: RE | Admit: 2018-04-23 | Discharge: 2018-04-23 | Disposition: A | Discharge status: Home / Self Care | Payer: BLUE CROSS/BLUE SHIELD | Source: Ambulatory Visit | Attending: Radiation Oncology | Admitting: Radiation Oncology

## 2018-04-23 DIAGNOSIS — Z510 Encounter for antineoplastic radiation therapy: Secondary | ICD-10-CM | POA: Diagnosis not present

## 2018-04-23 DIAGNOSIS — C61 Malignant neoplasm of prostate: Secondary | ICD-10-CM | POA: Diagnosis not present

## 2018-04-24 ENCOUNTER — Ambulatory Visit
Admission: RE | Admit: 2018-04-24 | Discharge: 2018-04-24 | Disposition: A | Discharge status: Home / Self Care | Payer: BLUE CROSS/BLUE SHIELD | Source: Ambulatory Visit | Attending: Radiation Oncology | Admitting: Radiation Oncology

## 2018-04-24 DIAGNOSIS — C61 Malignant neoplasm of prostate: Secondary | ICD-10-CM | POA: Diagnosis not present

## 2018-04-24 DIAGNOSIS — Z510 Encounter for antineoplastic radiation therapy: Secondary | ICD-10-CM | POA: Diagnosis not present

## 2018-04-25 ENCOUNTER — Ambulatory Visit: Payer: BLUE CROSS/BLUE SHIELD

## 2018-04-25 ENCOUNTER — Encounter: Payer: Self-pay | Admitting: Radiation Oncology

## 2018-04-25 ENCOUNTER — Ambulatory Visit
Admission: RE | Admit: 2018-04-25 | Discharge: 2018-04-25 | Disposition: A | Discharge status: Home / Self Care | Payer: BLUE CROSS/BLUE SHIELD | Source: Ambulatory Visit | Attending: Radiation Oncology | Admitting: Radiation Oncology

## 2018-04-25 DIAGNOSIS — Z510 Encounter for antineoplastic radiation therapy: Secondary | ICD-10-CM | POA: Diagnosis not present

## 2018-04-25 DIAGNOSIS — C61 Malignant neoplasm of prostate: Secondary | ICD-10-CM | POA: Diagnosis not present

## 2018-04-28 ENCOUNTER — Ambulatory Visit: Payer: BLUE CROSS/BLUE SHIELD

## 2018-04-28 DIAGNOSIS — C61 Malignant neoplasm of prostate: Secondary | ICD-10-CM | POA: Diagnosis not present

## 2018-04-28 DIAGNOSIS — Z87891 Personal history of nicotine dependence: Secondary | ICD-10-CM | POA: Diagnosis not present

## 2018-04-28 DIAGNOSIS — Z9079 Acquired absence of other genital organ(s): Secondary | ICD-10-CM | POA: Diagnosis not present

## 2018-04-28 NOTE — Progress Notes (Signed)
  Radiation Oncology         [phone]) [phone] ________________________________  Name: Kevin [name] MRN: [id number]  Date: 04/25/2018  DOB: 07/23/1956  End of Treatment Note  Diagnosis:   62 y.o. male with history of high grade Gleason 4+5 = 9 (pT2b) prostate cancer s/p radical prostatectomy (07/2016); now with evidence of biochemical recurrence with rising PSA of 0.15    Indication for treatment:  Curative, Salvage Prostatic Fossa Radiotherapy       Radiation treatment dates:   03/04/2018 - 04/25/2018  Site/dose:   The patient initially received 45 Gy in 25 fractions of 1.8 Gy, followed by a boost to the prostate bed to a total dose of 68.4 Gy with 13 additional fractions of 1.8 Gy.  Beams/energy:   The prostatic fossa was treated using helical intensity modulated radiotherapy delivering 6 megavolt photons. Image guidance was performed with megavoltage CT studies prior to each fraction. He was immobilized with a body fix lower extremity mold.  Narrative: The patient tolerated radiation treatment relatively well.  He experienced mild fatigue and some minor urinary irritation with symptoms of urgency, post-void dribble, and nocturia x1. He also reported frequent soft bowel movements.  Plan: The patient has completed radiation treatment. He will return to radiation oncology clinic for routine followup in one month. I advised him to call or return sooner if he has any questions or concerns related to his recovery or treatment. ________________________________  Sheral Apley. Tammi Klippel, M.D.  This document serves as a record of services personally performed by Tyler Pita, MD. It was created on his behalf by Rae Lips, a trained medical scribe. The creation of this record is based on the scribe's personal observations and the provider's statements to them. This document has been checked and approved by the attending provider.

## 2018-05-06 DIAGNOSIS — Z79899 Other long term (current) drug therapy: Secondary | ICD-10-CM | POA: Diagnosis not present

## 2018-05-06 DIAGNOSIS — C8408 Mycosis fungoides, lymph nodes of multiple sites: Secondary | ICD-10-CM | POA: Diagnosis not present

## 2018-05-06 DIAGNOSIS — C8400 Mycosis fungoides, unspecified site: Secondary | ICD-10-CM | POA: Diagnosis not present

## 2018-05-06 DIAGNOSIS — L986 Other infiltrative disorders of the skin and subcutaneous tissue: Secondary | ICD-10-CM | POA: Diagnosis not present

## 2018-05-06 DIAGNOSIS — L83 Acanthosis nigricans: Secondary | ICD-10-CM | POA: Diagnosis not present

## 2018-05-06 DIAGNOSIS — C8409 Mycosis fungoides, extranodal and solid organ sites: Secondary | ICD-10-CM | POA: Diagnosis not present

## 2018-05-06 DIAGNOSIS — L652 Alopecia mucinosa: Secondary | ICD-10-CM | POA: Diagnosis not present

## 2018-05-06 DIAGNOSIS — F329 Major depressive disorder, single episode, unspecified: Secondary | ICD-10-CM | POA: Diagnosis not present

## 2018-05-06 DIAGNOSIS — L929 Granulomatous disorder of the skin and subcutaneous tissue, unspecified: Secondary | ICD-10-CM | POA: Diagnosis not present

## 2018-05-13 DIAGNOSIS — F4321 Adjustment disorder with depressed mood: Secondary | ICD-10-CM | POA: Diagnosis not present

## 2018-05-19 DIAGNOSIS — F4323 Adjustment disorder with mixed anxiety and depressed mood: Secondary | ICD-10-CM | POA: Diagnosis not present

## 2018-05-27 ENCOUNTER — Telehealth: Payer: Self-pay

## 2018-05-27 NOTE — Telephone Encounter (Signed)
Message left for Dr. Inda Merlin nurse to provide update that patient has declined Palliative Care

## 2018-05-27 NOTE — Telephone Encounter (Signed)
Phone call placed to patient to offer to schedule visit with Palliative Care. Patient declined need for palliative care

## 2018-05-29 ENCOUNTER — Encounter: Payer: Self-pay | Admitting: Urology

## 2018-05-29 ENCOUNTER — Other Ambulatory Visit: Payer: Self-pay

## 2018-05-29 ENCOUNTER — Ambulatory Visit
Admission: RE | Admit: 2018-05-29 | Discharge: 2018-05-29 | Disposition: A | Discharge status: Home / Self Care | Payer: BLUE CROSS/BLUE SHIELD | Source: Ambulatory Visit | Attending: Urology | Admitting: Urology

## 2018-05-29 VITALS — BP 115/89 | HR 78 | Temp 99.0°F | Resp 18 | Ht <= 58 in | Wt 206.0 lb

## 2018-05-29 DIAGNOSIS — Z8546 Personal history of malignant neoplasm of prostate: Secondary | ICD-10-CM | POA: Diagnosis not present

## 2018-05-29 DIAGNOSIS — C61 Malignant neoplasm of prostate: Secondary | ICD-10-CM

## 2018-05-29 DIAGNOSIS — Z923 Personal history of irradiation: Secondary | ICD-10-CM | POA: Insufficient documentation

## 2018-05-29 DIAGNOSIS — C7982 Secondary malignant neoplasm of genital organs: Secondary | ICD-10-CM | POA: Insufficient documentation

## 2018-05-29 DIAGNOSIS — Z9079 Acquired absence of other genital organ(s): Secondary | ICD-10-CM | POA: Diagnosis not present

## 2018-05-29 DIAGNOSIS — Z79899 Other long term (current) drug therapy: Secondary | ICD-10-CM | POA: Insufficient documentation

## 2018-05-29 NOTE — Addendum Note (Signed)
Encounter addended by: Malena Edman, RN on: 05/29/2018 2:51 PM  Actions taken: Medication List reviewed, Problem List reviewed, Allergies reviewed, Charge Capture section accepted

## 2018-05-29 NOTE — Progress Notes (Signed)
Radiation Oncology         [phone] ________________________________  Name: Kevin [name] MRN: [id number]  Date: 05/29/2018  DOB: 1955-12-04  Post Treatment Note  CC: Josetta Huddle, MD  Lawanna Kobus, MD  Diagnosis:   62 y.o. 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  Narrative:  The patient returns today for routine follow-up. He tolerated radiation treatment relatively well.  He experienced mild fatigue and some minor urinary irritation with symptoms of urgency, post-void dribble, and nocturia x1. He also reported frequent soft bowel movements.                              On review of systems, the patient states that he is doing very well overall.  He has had complete resolution of the irritative lower urinary tract symptoms associated with his radiation treatments.  His bowel habits have returned to normal as well.  He specifically denies gross hematuria, dysuria, excessive daytime frequency, urgency, weak stream, incomplete bladder emptying or incontinence.  His current IPSS score is 3 indicating mild urinary symptoms.  He denies abdominal pain, nausea, vomiting or diarrhea.  He reports a healthy appetite and is maintaining his weight.  He is continued on Lupron for androgen deprivation therapy and reports persistent fatigue associated with this but tolerable.  At this point, he is pleased with his progress to date.  ALLERGIES:  has No Known Allergies.  Meds: Current Outpatient Medications  Medication Sig Dispense Refill  . abiraterone acetate (ZYTIGA) 250 MG tablet Take by mouth.    Marland Kitchen amLODipine-olmesartan (AZOR)  5-20 MG tablet Take by mouth.    . citalopram (CELEXA) 20 MG tablet Take 20 mg by mouth daily.    . hydrocortisone (PROCTOZONE-HC) 2.5 % rectal cream Place 1 application rectally 2 (two) times daily. 30 g 0  . interferon alfa-2b (INTRON A) 6000000 UNIT/ML injection Inject 0.5 cc (3 Mu) three times a week subcutaneously    . milk thistle 175 MG tablet Take 175 mg by mouth daily.    . predniSONE (DELTASONE) 5 MG tablet Take by mouth.    . Psyllium (KONSYL-D) 52.3 % POWD Take by mouth.    . Red Yeast Rice Extract 600 MG CAPS Take 2 capsules by mouth daily.     Marland Kitchen triamcinolone cream (KENALOG) 0.1 % Apply as directed--best would be daily x 2 weeks, one week off and repeat    . acitretin (SORIATANE) 25 MG capsule TAKE ONE CAPSULE BY MOUTH EVERY DAY    . metroNIDAZOLE (METROGEL) 1 % gel Apply topically.     No current facility-administered medications for this encounter.     Physical Findings:  height is 6" (0.152 m) (abnormal) and weight is 206 lb (93.4 kg). His oral temperature is 99 F (37.2 C). His blood pressure is 115/89 and his pulse is 78. His respiration is 18 and oxygen saturation is 99%.  Pain Assessment Pain Score: 0-No pain/10 In general this is a well appearing Caucasian male in no acute distress.  He's alert and  oriented x4 and appropriate throughout the examination. Cardiopulmonary assessment is negative for acute distress and he exhibits normal effort.   Lab Findings: Lab Results  Component Value Date   WBC 12.9 (H) 01/24/2017   HGB 11.2 (L) 01/24/2017   HCT 31.6 (L) 01/24/2017   MCV 88.8 01/24/2017   PLT 213 01/24/2017     Radiographic Findings: No results found.  Impression/Plan: 1. 62 y.o. male with history of high grade Gleason 4+5 = 9 (pT2b) prostate cancer s/p radical prostatectomy (07/2016); now with evidence of biochemical recurrence with rising PSA of 0.15.  He will continue to follow up with urology for ongoing PSA determinations and has an appointment  scheduled with Dr. Iona Beard at [facility] in October 2019 for repeat PSA. He understands what to expect with regards to PSA monitoring going forward. I will look forward to following his response to treatment via correspondence with urology, and would be happy to continue to participate in his care if clinically indicated. I talked to the patient about what to expect in the future, including his risk for erectile dysfunction and rectal bleeding. I encouraged him to call or return to the office if he has any questions regarding his previous radiation or possible radiation side effects. He was comfortable with this plan and will follow up as needed.      Nicholos Johns, PA-C

## 2018-07-21 DIAGNOSIS — C61 Malignant neoplasm of prostate: Secondary | ICD-10-CM | POA: Diagnosis not present

## 2018-07-29 DIAGNOSIS — Z79899 Other long term (current) drug therapy: Secondary | ICD-10-CM | POA: Diagnosis not present

## 2018-07-29 DIAGNOSIS — C8400 Mycosis fungoides, unspecified site: Secondary | ICD-10-CM | POA: Diagnosis not present

## 2018-08-19 DIAGNOSIS — Z1211 Encounter for screening for malignant neoplasm of colon: Secondary | ICD-10-CM | POA: Diagnosis not present

## 2018-08-19 DIAGNOSIS — Z01818 Encounter for other preprocedural examination: Secondary | ICD-10-CM | POA: Diagnosis not present

## 2018-09-18 DIAGNOSIS — C8400 Mycosis fungoides, unspecified site: Secondary | ICD-10-CM | POA: Diagnosis not present

## 2018-09-18 DIAGNOSIS — Z85828 Personal history of other malignant neoplasm of skin: Secondary | ICD-10-CM | POA: Diagnosis not present

## 2018-09-18 DIAGNOSIS — D1801 Hemangioma of skin and subcutaneous tissue: Secondary | ICD-10-CM | POA: Diagnosis not present

## 2018-09-18 DIAGNOSIS — D225 Melanocytic nevi of trunk: Secondary | ICD-10-CM | POA: Diagnosis not present

## 2018-09-22 DIAGNOSIS — D120 Benign neoplasm of cecum: Secondary | ICD-10-CM | POA: Diagnosis not present

## 2018-09-22 DIAGNOSIS — K640 First degree hemorrhoids: Secondary | ICD-10-CM | POA: Diagnosis not present

## 2018-09-22 DIAGNOSIS — Z1211 Encounter for screening for malignant neoplasm of colon: Secondary | ICD-10-CM | POA: Diagnosis not present

## 2018-09-24 DIAGNOSIS — Z1211 Encounter for screening for malignant neoplasm of colon: Secondary | ICD-10-CM | POA: Diagnosis not present

## 2018-09-24 DIAGNOSIS — D120 Benign neoplasm of cecum: Secondary | ICD-10-CM | POA: Diagnosis not present

## 2018-10-13 DIAGNOSIS — H5203 Hypermetropia, bilateral: Secondary | ICD-10-CM | POA: Diagnosis not present

## 2018-10-13 DIAGNOSIS — H33302 Unspecified retinal break, left eye: Secondary | ICD-10-CM | POA: Diagnosis not present

## 2018-12-01 DIAGNOSIS — C8400 Mycosis fungoides, unspecified site: Secondary | ICD-10-CM | POA: Diagnosis not present

## 2018-12-01 DIAGNOSIS — Z79899 Other long term (current) drug therapy: Secondary | ICD-10-CM | POA: Diagnosis not present

## 2019-01-27 DIAGNOSIS — C61 Malignant neoplasm of prostate: Secondary | ICD-10-CM | POA: Diagnosis not present

## 2019-02-13 DIAGNOSIS — C61 Malignant neoplasm of prostate: Secondary | ICD-10-CM | POA: Diagnosis not present

## 2019-02-19 DIAGNOSIS — C84A0 Cutaneous T-cell lymphoma, unspecified, unspecified site: Secondary | ICD-10-CM | POA: Diagnosis not present

## 2019-02-19 DIAGNOSIS — I82401 Acute embolism and thrombosis of unspecified deep veins of right lower extremity: Secondary | ICD-10-CM | POA: Diagnosis not present

## 2019-02-19 DIAGNOSIS — E559 Vitamin D deficiency, unspecified: Secondary | ICD-10-CM | POA: Diagnosis not present

## 2019-02-19 DIAGNOSIS — I10 Essential (primary) hypertension: Secondary | ICD-10-CM | POA: Diagnosis not present

## 2019-03-03 DIAGNOSIS — E785 Hyperlipidemia, unspecified: Secondary | ICD-10-CM | POA: Diagnosis not present

## 2019-03-03 DIAGNOSIS — Z8249 Family history of ischemic heart disease and other diseases of the circulatory system: Secondary | ICD-10-CM | POA: Diagnosis not present

## 2019-03-03 DIAGNOSIS — E559 Vitamin D deficiency, unspecified: Secondary | ICD-10-CM | POA: Diagnosis not present

## 2019-03-03 DIAGNOSIS — Z823 Family history of stroke: Secondary | ICD-10-CM | POA: Diagnosis not present

## 2019-04-01 DIAGNOSIS — R0681 Apnea, not elsewhere classified: Secondary | ICD-10-CM | POA: Diagnosis not present

## 2019-04-01 DIAGNOSIS — G4719 Other hypersomnia: Secondary | ICD-10-CM | POA: Diagnosis not present

## 2019-04-14 DIAGNOSIS — C84A0 Cutaneous T-cell lymphoma, unspecified, unspecified site: Secondary | ICD-10-CM | POA: Diagnosis not present

## 2019-04-16 DIAGNOSIS — G4733 Obstructive sleep apnea (adult) (pediatric): Secondary | ICD-10-CM | POA: Diagnosis not present

## 2019-05-20 DIAGNOSIS — G4733 Obstructive sleep apnea (adult) (pediatric): Secondary | ICD-10-CM | POA: Diagnosis not present

## 2019-06-01 DIAGNOSIS — C8400 Mycosis fungoides, unspecified site: Secondary | ICD-10-CM | POA: Diagnosis not present

## 2019-06-20 DIAGNOSIS — G4733 Obstructive sleep apnea (adult) (pediatric): Secondary | ICD-10-CM | POA: Diagnosis not present

## 2019-07-20 DIAGNOSIS — G4733 Obstructive sleep apnea (adult) (pediatric): Secondary | ICD-10-CM | POA: Diagnosis not present

## 2019-08-03 DIAGNOSIS — C61 Malignant neoplasm of prostate: Secondary | ICD-10-CM | POA: Diagnosis not present

## 2019-08-12 DIAGNOSIS — C61 Malignant neoplasm of prostate: Secondary | ICD-10-CM | POA: Diagnosis not present

## 2019-08-12 DIAGNOSIS — N5231 Erectile dysfunction following radical prostatectomy: Secondary | ICD-10-CM | POA: Diagnosis not present

## 2019-08-20 DIAGNOSIS — G4733 Obstructive sleep apnea (adult) (pediatric): Secondary | ICD-10-CM | POA: Diagnosis not present

## 2019-08-25 DIAGNOSIS — C8400 Mycosis fungoides, unspecified site: Secondary | ICD-10-CM | POA: Diagnosis not present

## 2019-09-10 DIAGNOSIS — C61 Malignant neoplasm of prostate: Secondary | ICD-10-CM | POA: Diagnosis not present

## 2019-09-10 DIAGNOSIS — N5231 Erectile dysfunction following radical prostatectomy: Secondary | ICD-10-CM | POA: Diagnosis not present

## 2019-09-28 DIAGNOSIS — D2262 Melanocytic nevi of left upper limb, including shoulder: Secondary | ICD-10-CM | POA: Diagnosis not present

## 2019-09-28 DIAGNOSIS — D225 Melanocytic nevi of trunk: Secondary | ICD-10-CM | POA: Diagnosis not present

## 2019-09-28 DIAGNOSIS — L918 Other hypertrophic disorders of the skin: Secondary | ICD-10-CM | POA: Diagnosis not present

## 2019-09-28 DIAGNOSIS — C4441 Basal cell carcinoma of skin of scalp and neck: Secondary | ICD-10-CM | POA: Diagnosis not present

## 2019-09-28 DIAGNOSIS — L821 Other seborrheic keratosis: Secondary | ICD-10-CM | POA: Diagnosis not present

## 2019-09-30 ENCOUNTER — Ambulatory Visit: Payer: BC Managed Care – PPO | Attending: Internal Medicine

## 2019-09-30 DIAGNOSIS — Z20828 Contact with and (suspected) exposure to other viral communicable diseases: Secondary | ICD-10-CM | POA: Diagnosis not present

## 2019-09-30 DIAGNOSIS — Z20822 Contact with and (suspected) exposure to covid-19: Secondary | ICD-10-CM

## 2019-10-01 LAB — NOVEL CORONAVIRUS, NAA: SARS-CoV-2, NAA: NOT DETECTED

## 2019-10-15 DIAGNOSIS — H33302 Unspecified retinal break, left eye: Secondary | ICD-10-CM | POA: Diagnosis not present

## 2019-10-15 DIAGNOSIS — H524 Presbyopia: Secondary | ICD-10-CM | POA: Diagnosis not present

## 2019-10-15 DIAGNOSIS — H2513 Age-related nuclear cataract, bilateral: Secondary | ICD-10-CM | POA: Diagnosis not present

## 2019-10-15 DIAGNOSIS — H5203 Hypermetropia, bilateral: Secondary | ICD-10-CM | POA: Diagnosis not present

## 2019-10-22 DIAGNOSIS — C61 Malignant neoplasm of prostate: Secondary | ICD-10-CM | POA: Diagnosis not present

## 2019-10-28 DIAGNOSIS — G4733 Obstructive sleep apnea (adult) (pediatric): Secondary | ICD-10-CM | POA: Diagnosis not present

## 2019-11-23 DIAGNOSIS — Z79899 Other long term (current) drug therapy: Secondary | ICD-10-CM | POA: Diagnosis not present

## 2019-11-23 DIAGNOSIS — L905 Scar conditions and fibrosis of skin: Secondary | ICD-10-CM | POA: Diagnosis not present

## 2019-11-23 DIAGNOSIS — C8400 Mycosis fungoides, unspecified site: Secondary | ICD-10-CM | POA: Diagnosis not present

## 2019-11-23 DIAGNOSIS — L570 Actinic keratosis: Secondary | ICD-10-CM | POA: Diagnosis not present

## 2019-11-23 DIAGNOSIS — L986 Other infiltrative disorders of the skin and subcutaneous tissue: Secondary | ICD-10-CM | POA: Diagnosis not present

## 2019-12-09 DIAGNOSIS — C61 Malignant neoplasm of prostate: Secondary | ICD-10-CM | POA: Diagnosis not present

## 2019-12-28 DIAGNOSIS — C61 Malignant neoplasm of prostate: Secondary | ICD-10-CM | POA: Diagnosis not present

## 2020-01-20 DIAGNOSIS — C61 Malignant neoplasm of prostate: Secondary | ICD-10-CM | POA: Diagnosis not present

## 2020-01-22 DIAGNOSIS — Z20828 Contact with and (suspected) exposure to other viral communicable diseases: Secondary | ICD-10-CM | POA: Diagnosis not present

## 2020-02-05 DIAGNOSIS — G4733 Obstructive sleep apnea (adult) (pediatric): Secondary | ICD-10-CM | POA: Diagnosis not present

## 2020-02-19 ENCOUNTER — Other Ambulatory Visit: Payer: Self-pay | Admitting: Urology

## 2020-03-02 DIAGNOSIS — C61 Malignant neoplasm of prostate: Secondary | ICD-10-CM | POA: Diagnosis not present

## 2020-03-08 DIAGNOSIS — C61 Malignant neoplasm of prostate: Secondary | ICD-10-CM | POA: Diagnosis not present

## 2020-04-05 DIAGNOSIS — G4733 Obstructive sleep apnea (adult) (pediatric): Secondary | ICD-10-CM | POA: Diagnosis not present

## 2020-04-18 DIAGNOSIS — Z79899 Other long term (current) drug therapy: Secondary | ICD-10-CM | POA: Diagnosis not present

## 2020-04-18 DIAGNOSIS — L652 Alopecia mucinosa: Secondary | ICD-10-CM | POA: Diagnosis not present

## 2020-04-18 DIAGNOSIS — C8400 Mycosis fungoides, unspecified site: Secondary | ICD-10-CM | POA: Diagnosis not present

## 2020-04-20 DIAGNOSIS — C61 Malignant neoplasm of prostate: Secondary | ICD-10-CM | POA: Diagnosis not present

## 2020-05-05 DIAGNOSIS — G4733 Obstructive sleep apnea (adult) (pediatric): Secondary | ICD-10-CM | POA: Diagnosis not present

## 2020-05-12 DIAGNOSIS — C61 Malignant neoplasm of prostate: Secondary | ICD-10-CM | POA: Diagnosis not present

## 2020-05-12 DIAGNOSIS — E559 Vitamin D deficiency, unspecified: Secondary | ICD-10-CM | POA: Diagnosis not present

## 2020-05-12 DIAGNOSIS — C84A0 Cutaneous T-cell lymphoma, unspecified, unspecified site: Secondary | ICD-10-CM | POA: Diagnosis not present

## 2020-05-12 DIAGNOSIS — Z0001 Encounter for general adult medical examination with abnormal findings: Secondary | ICD-10-CM | POA: Diagnosis not present

## 2020-06-08 DIAGNOSIS — N5231 Erectile dysfunction following radical prostatectomy: Secondary | ICD-10-CM | POA: Diagnosis not present

## 2020-06-15 DIAGNOSIS — C61 Malignant neoplasm of prostate: Secondary | ICD-10-CM | POA: Diagnosis not present

## 2020-06-17 ENCOUNTER — Other Ambulatory Visit: Payer: Self-pay

## 2020-06-17 ENCOUNTER — Encounter (HOSPITAL_BASED_OUTPATIENT_CLINIC_OR_DEPARTMENT_OTHER): Payer: Self-pay | Admitting: Urology

## 2020-06-17 NOTE — Progress Notes (Signed)
Spoke w/ via phone for pre-op interview---pt Lab needs dos----   I stat 8, ekg            Lab results------none COVID test ------06-21-2020 830 am Arrive at -------830 am 06-23-2020 NPO after MN NO Solid Food.  Clear liquids from MN until---730 am then npo Medications to take morning of surgery -----none Diabetic medication -----n/a Patient Special Instructions -----hibiclens shower am of surgery chin to toes Pre-Op special Istructions -----bring cpap mask tubing and machine and leave in car, pt given overnight stay instructions Patient verbalized understanding of instructions that were given at this phone interview. Patient denies shortness of breath, chest pain, fever, cough at this phone interview.

## 2020-06-20 ENCOUNTER — Other Ambulatory Visit (HOSPITAL_COMMUNITY): Payer: BC Managed Care – PPO

## 2020-06-21 ENCOUNTER — Other Ambulatory Visit (HOSPITAL_COMMUNITY)
Admission: RE | Admit: 2020-06-21 | Discharge: 2020-06-21 | Disposition: A | Discharge status: Home / Self Care | Payer: BC Managed Care – PPO | Source: Ambulatory Visit | Attending: Urology | Admitting: Urology

## 2020-06-21 DIAGNOSIS — Z01812 Encounter for preprocedural laboratory examination: Secondary | ICD-10-CM | POA: Insufficient documentation

## 2020-06-21 DIAGNOSIS — Z20822 Contact with and (suspected) exposure to covid-19: Secondary | ICD-10-CM | POA: Insufficient documentation

## 2020-06-21 LAB — SARS CORONAVIRUS 2 (TAT 6-24 HRS): SARS Coronavirus 2: NEGATIVE

## 2020-06-23 ENCOUNTER — Other Ambulatory Visit: Payer: Self-pay

## 2020-06-23 ENCOUNTER — Ambulatory Visit (HOSPITAL_BASED_OUTPATIENT_CLINIC_OR_DEPARTMENT_OTHER): Payer: BC Managed Care – PPO | Admitting: Certified Registered"

## 2020-06-23 ENCOUNTER — Encounter (HOSPITAL_BASED_OUTPATIENT_CLINIC_OR_DEPARTMENT_OTHER)
Admission: RE | Disposition: A | Discharge status: Home / Self Care | Payer: Self-pay | Source: Home / Self Care | Attending: Urology

## 2020-06-23 ENCOUNTER — Encounter (HOSPITAL_BASED_OUTPATIENT_CLINIC_OR_DEPARTMENT_OTHER): Payer: Self-pay | Admitting: Urology

## 2020-06-23 ENCOUNTER — Observation Stay (HOSPITAL_BASED_OUTPATIENT_CLINIC_OR_DEPARTMENT_OTHER)
Admission: RE | Admit: 2020-06-23 | Discharge: 2020-06-24 | Disposition: A | Discharge status: Home / Self Care | Payer: BC Managed Care – PPO | Attending: Urology | Admitting: Urology

## 2020-06-23 DIAGNOSIS — N5234 Erectile dysfunction following simple prostatectomy: Secondary | ICD-10-CM | POA: Diagnosis not present

## 2020-06-23 DIAGNOSIS — N5201 Erectile dysfunction due to arterial insufficiency: Secondary | ICD-10-CM | POA: Diagnosis not present

## 2020-06-23 DIAGNOSIS — F17210 Nicotine dependence, cigarettes, uncomplicated: Secondary | ICD-10-CM | POA: Diagnosis not present

## 2020-06-23 DIAGNOSIS — I10 Essential (primary) hypertension: Secondary | ICD-10-CM | POA: Diagnosis not present

## 2020-06-23 DIAGNOSIS — N5231 Erectile dysfunction following radical prostatectomy: Secondary | ICD-10-CM | POA: Diagnosis not present

## 2020-06-23 DIAGNOSIS — Z8546 Personal history of malignant neoplasm of prostate: Secondary | ICD-10-CM | POA: Diagnosis not present

## 2020-06-23 DIAGNOSIS — G4730 Sleep apnea, unspecified: Secondary | ICD-10-CM | POA: Diagnosis not present

## 2020-06-23 DIAGNOSIS — N529 Male erectile dysfunction, unspecified: Secondary | ICD-10-CM | POA: Diagnosis not present

## 2020-06-23 HISTORY — DX: Male erectile dysfunction, unspecified: N52.9

## 2020-06-23 HISTORY — DX: Rosacea, unspecified: L71.9

## 2020-06-23 HISTORY — DX: Sleep apnea, unspecified: G47.30

## 2020-06-23 HISTORY — PX: PENILE PROSTHESIS IMPLANT: SHX240

## 2020-06-23 LAB — POCT I-STAT, CHEM 8
BUN: 17 mg/dL (ref 8–23)
Calcium, Ion: 1.28 mmol/L (ref 1.15–1.40)
Chloride: 106 mmol/L (ref 98–111)
Creatinine, Ser: 0.9 mg/dL (ref 0.61–1.24)
Glucose, Bld: 108 mg/dL — ABNORMAL HIGH (ref 70–99)
HCT: 44 % (ref 39.0–52.0)
Hemoglobin: 15 g/dL (ref 13.0–17.0)
Potassium: 4.5 mmol/L (ref 3.5–5.1)
Sodium: 140 mmol/L (ref 135–145)
TCO2: 24 mmol/L (ref 22–32)

## 2020-06-23 SURGERY — INSERTION, PENILE PROSTHESIS, INFLATABLE
Anesthesia: General | Site: Penis

## 2020-06-23 MED ORDER — FENTANYL CITRATE (PF) 100 MCG/2ML IJ SOLN
INTRAMUSCULAR | Status: DC | PRN
  Administered 2020-06-23: 50 ug via INTRAVENOUS
  Administered 2020-06-23: 100 ug via INTRAVENOUS
  Administered 2020-06-23: 50 ug via INTRAVENOUS

## 2020-06-23 MED ORDER — ROCURONIUM BROMIDE 10 MG/ML (PF) SYRINGE
PREFILLED_SYRINGE | INTRAVENOUS | Status: DC | PRN
  Administered 2020-06-23: 60 mg via INTRAVENOUS

## 2020-06-23 MED ORDER — ROCURONIUM BROMIDE 10 MG/ML (PF) SYRINGE
PREFILLED_SYRINGE | INTRAVENOUS | Status: AC
  Filled 2020-06-23: qty 10

## 2020-06-23 MED ORDER — DEXAMETHASONE SODIUM PHOSPHATE 10 MG/ML IJ SOLN
INTRAMUSCULAR | Status: DC | PRN
  Administered 2020-06-23: 10 mg via INTRAVENOUS

## 2020-06-23 MED ORDER — AMPHOTERICIN B 50 MG IV SOLR
50.0000 mg | Freq: Once | INTRAVENOUS | Status: AC
  Administered 2020-06-23: 50 mg
  Filled 2020-06-23: qty 50

## 2020-06-23 MED ORDER — OXYCODONE HCL 5 MG PO TABS
ORAL_TABLET | ORAL | Status: AC
  Filled 2020-06-23: qty 1

## 2020-06-23 MED ORDER — SENNA 8.6 MG PO TABS
ORAL_TABLET | ORAL | Status: AC
  Filled 2020-06-23: qty 1

## 2020-06-23 MED ORDER — LACTATED RINGERS IV SOLN: INTRAVENOUS | Status: DC

## 2020-06-23 MED ORDER — CIPROFLOXACIN IN D5W 400 MG/200ML IV SOLN
INTRAVENOUS | Status: AC
  Filled 2020-06-23: qty 200

## 2020-06-23 MED ORDER — CIPROFLOXACIN IN D5W 400 MG/200ML IV SOLN
400.0000 mg | Freq: Two times a day (BID) | INTRAVENOUS | Status: DC
  Administered 2020-06-23 – 2020-06-24 (×2): 400 mg via INTRAVENOUS

## 2020-06-23 MED ORDER — OXYCODONE HCL 5 MG/5ML PO SOLN: 5.0000 mg | Freq: Once | ORAL | Status: DC | PRN

## 2020-06-23 MED ORDER — LIDOCAINE 2% (20 MG/ML) 5 ML SYRINGE
INTRAMUSCULAR | Status: AC
  Filled 2020-06-23: qty 5

## 2020-06-23 MED ORDER — BACITRACIN-NEOMYCIN-POLYMYXIN OINTMENT TUBE
TOPICAL_OINTMENT | CUTANEOUS | Status: AC
  Filled 2020-06-23: qty 14.17

## 2020-06-23 MED ORDER — LIDOCAINE 2% (20 MG/ML) 5 ML SYRINGE
INTRAMUSCULAR | Status: DC | PRN
  Administered 2020-06-23: 100 mg via INTRAVENOUS

## 2020-06-23 MED ORDER — SODIUM CHLORIDE 0.45 % IV SOLN: INTRAVENOUS | Status: DC

## 2020-06-23 MED ORDER — SUGAMMADEX SODIUM 200 MG/2ML IV SOLN
INTRAVENOUS | Status: DC | PRN
  Administered 2020-06-23: 200 mg via INTRAVENOUS

## 2020-06-23 MED ORDER — CEFAZOLIN SODIUM-DEXTROSE 2-4 GM/100ML-% IV SOLN
INTRAVENOUS | Status: AC
  Filled 2020-06-23: qty 100

## 2020-06-23 MED ORDER — SODIUM CHLORIDE 0.9 % IV SOLN
Freq: Once | INTRAVENOUS | Status: AC
  Filled 2020-06-23: qty 500000

## 2020-06-23 MED ORDER — MIDAZOLAM HCL 2 MG/2ML IJ SOLN
INTRAMUSCULAR | Status: AC
  Filled 2020-06-23: qty 2

## 2020-06-23 MED ORDER — DEXAMETHASONE SODIUM PHOSPHATE 10 MG/ML IJ SOLN
INTRAMUSCULAR | Status: AC
  Filled 2020-06-23: qty 1

## 2020-06-23 MED ORDER — ONDANSETRON HCL 4 MG/2ML IJ SOLN: 4.0000 mg | Freq: Once | INTRAMUSCULAR | Status: DC | PRN

## 2020-06-23 MED ORDER — ONDANSETRON HCL 4 MG/2ML IJ SOLN
INTRAMUSCULAR | Status: AC
  Filled 2020-06-23: qty 2

## 2020-06-23 MED ORDER — ACETAMINOPHEN 325 MG PO TABS: 325.0000 mg | ORAL_TABLET | ORAL | Status: DC | PRN

## 2020-06-23 MED ORDER — ACETAMINOPHEN 160 MG/5ML PO SOLN: 325.0000 mg | ORAL | Status: DC | PRN

## 2020-06-23 MED ORDER — SENNA 8.6 MG PO TABS
1.0000 | ORAL_TABLET | Freq: Two times a day (BID) | ORAL | Status: DC
  Administered 2020-06-23: 8.6 mg via ORAL

## 2020-06-23 MED ORDER — MIDAZOLAM HCL 5 MG/5ML IJ SOLN
INTRAMUSCULAR | Status: DC | PRN
  Administered 2020-06-23: 2 mg via INTRAVENOUS

## 2020-06-23 MED ORDER — PROPOFOL 10 MG/ML IV BOLUS
INTRAVENOUS | Status: DC | PRN
  Administered 2020-06-23: 200 mg via INTRAVENOUS

## 2020-06-23 MED ORDER — ZOLPIDEM TARTRATE 5 MG PO TABS: 5.0000 mg | ORAL_TABLET | Freq: Every evening | ORAL | Status: DC | PRN

## 2020-06-23 MED ORDER — OXYCODONE HCL 5 MG PO TABS
5.0000 mg | ORAL_TABLET | ORAL | Status: DC | PRN
  Administered 2020-06-23 – 2020-06-24 (×5): 5 mg via ORAL

## 2020-06-23 MED ORDER — HYDROMORPHONE HCL 1 MG/ML IJ SOLN
INTRAMUSCULAR | Status: AC
  Filled 2020-06-23: qty 1

## 2020-06-23 MED ORDER — SODIUM CHLORIDE 0.9 % IR SOLN
Status: DC | PRN
  Administered 2020-06-23: 100 mL

## 2020-06-23 MED ORDER — FENTANYL CITRATE (PF) 250 MCG/5ML IJ SOLN
INTRAMUSCULAR | Status: AC
  Filled 2020-06-23: qty 5

## 2020-06-23 MED ORDER — ONDANSETRON HCL 4 MG/2ML IJ SOLN
INTRAMUSCULAR | Status: DC | PRN
  Administered 2020-06-23: 4 mg via INTRAVENOUS

## 2020-06-23 MED ORDER — OXYCODONE HCL 5 MG PO TABS: 5.0000 mg | ORAL_TABLET | Freq: Once | ORAL | Status: DC | PRN

## 2020-06-23 MED ORDER — CEFAZOLIN SODIUM-DEXTROSE 2-4 GM/100ML-% IV SOLN
2.0000 g | INTRAVENOUS | Status: AC
  Administered 2020-06-23: 2 g via INTRAVENOUS

## 2020-06-23 MED ORDER — HYDROMORPHONE HCL 1 MG/ML IJ SOLN
0.5000 mg | INTRAMUSCULAR | Status: DC | PRN
  Administered 2020-06-23: 1 mg via INTRAVENOUS

## 2020-06-23 MED ORDER — PROPOFOL 10 MG/ML IV BOLUS
INTRAVENOUS | Status: AC
  Filled 2020-06-23: qty 20

## 2020-06-23 MED ORDER — HYDROMORPHONE HCL 1 MG/ML IJ SOLN
0.2500 mg | INTRAMUSCULAR | Status: DC | PRN
  Administered 2020-06-23 (×2): 0.25 mg via INTRAVENOUS
  Administered 2020-06-23: 0.5 mg via INTRAVENOUS

## 2020-06-23 SURGICAL SUPPLY — 81 items
ADH SKN CLS APL DERMABOND .7 (GAUZE/BANDAGES/DRESSINGS) ×1
APL PRP STRL LF DISP 70% ISPRP (MISCELLANEOUS) ×1
APL SKNCLS STERI-STRIP NONHPOA (GAUZE/BANDAGES/DRESSINGS)
APL SWBSTK 6 STRL LF DISP (MISCELLANEOUS)
APPLICATOR COTTON TIP 6 STRL (MISCELLANEOUS) IMPLANT
APPLICATOR COTTON TIP 6IN STRL (MISCELLANEOUS)
BAG DECANTER FOR FLEXI CONT (MISCELLANEOUS) ×4 IMPLANT
BAG DRN RND TRDRP ANRFLXCHMBR (UROLOGICAL SUPPLIES)
BAG URINE DRAIN 2000ML AR STRL (UROLOGICAL SUPPLIES) IMPLANT
BENZOIN TINCTURE PRP APPL 2/3 (GAUZE/BANDAGES/DRESSINGS) IMPLANT
BLADE CLIPPER SENSICLIP SURGIC (BLADE) ×2 IMPLANT
BLADE HEX COATED 2.75 (ELECTRODE) ×2 IMPLANT
BLADE SURG 15 STRL LF DISP TIS (BLADE) ×2 IMPLANT
BLADE SURG 15 STRL SS (BLADE) ×4
BNDG COHESIVE 2X5 TAN STRL LF (GAUZE/BANDAGES/DRESSINGS) IMPLANT
BNDG GAUZE ELAST 4 BULKY (GAUZE/BANDAGES/DRESSINGS) ×2 IMPLANT
BRUSH SCRUB EZ PLAIN DRY (MISCELLANEOUS) ×2 IMPLANT
CANISTER SUCT 1200ML W/VALVE (MISCELLANEOUS) IMPLANT
CANISTER SUCT 3000ML PPV (MISCELLANEOUS) IMPLANT
CATH FOLEY 2WAY SLVR  5CC 16FR (CATHETERS) ×2
CATH FOLEY 2WAY SLVR 5CC 16FR (CATHETERS) ×1 IMPLANT
CHLORAPREP W/TINT 26 (MISCELLANEOUS) ×2 IMPLANT
COVER BACK TABLE 60X90IN (DRAPES) ×2 IMPLANT
COVER MAYO STAND STRL (DRAPES) ×4 IMPLANT
COVER WAND RF STERILE (DRAPES) ×2 IMPLANT
DERMABOND ADVANCED (GAUZE/BANDAGES/DRESSINGS) ×1
DERMABOND ADVANCED .7 DNX12 (GAUZE/BANDAGES/DRESSINGS) ×1 IMPLANT
DISSECTOR ROUND CHERRY 3/8 STR (MISCELLANEOUS) IMPLANT
DRAPE INCISE IOBAN 66X45 STRL (DRAPES) ×2 IMPLANT
DRAPE LAPAROTOMY TRNSV 102X78 (DRAPES) ×2 IMPLANT
DRSG TEGADERM 4X4.75 (GAUZE/BANDAGES/DRESSINGS) IMPLANT
DRSG TELFA 3X8 NADH (GAUZE/BANDAGES/DRESSINGS) ×2 IMPLANT
ELECT REM PT RETURN 9FT ADLT (ELECTROSURGICAL) ×2
ELECTRODE REM PT RTRN 9FT ADLT (ELECTROSURGICAL) ×1 IMPLANT
GLOVE BIO SURGEON STRL SZ 6 (GLOVE) ×4 IMPLANT
GLOVE BIO SURGEON STRL SZ7 (GLOVE) ×4 IMPLANT
GLOVE BIO SURGEON STRL SZ8 (GLOVE) ×4 IMPLANT
GLOVE BIOGEL PI IND STRL 6.5 (GLOVE) ×2 IMPLANT
GLOVE BIOGEL PI IND STRL 7.0 (GLOVE) ×2 IMPLANT
GLOVE BIOGEL PI IND STRL 7.5 (GLOVE) ×2 IMPLANT
GLOVE BIOGEL PI INDICATOR 6.5 (GLOVE) ×2
GLOVE BIOGEL PI INDICATOR 7.0 (GLOVE) ×2
GLOVE BIOGEL PI INDICATOR 7.5 (GLOVE) ×2
GLOVE ECLIPSE 7.5 STRL STRAW (GLOVE) ×2 IMPLANT
GOWN STRL REUS W/TWL LRG LVL3 (GOWN DISPOSABLE) ×8 IMPLANT
GOWN STRL REUS W/TWL XL LVL3 (GOWN DISPOSABLE) ×2 IMPLANT
HOLDER FOLEY CATH W/STRAP (MISCELLANEOUS) ×2 IMPLANT
IV CATH 18GX1.25 SAFE RETR GRN (IV SOLUTION) ×2 IMPLANT
KIT TITAN ASSEMBLY (Erectile Restoration) ×2 IMPLANT
KIT TITAN ASSEMBLY STANDARD (Erectile Restoration) ×1 IMPLANT
KIT TITAN ASSEMBLY STD (Erectile Restoration) ×1 IMPLANT
KIT TURNOVER CYSTO (KITS) ×2 IMPLANT
NS IRRIG 500ML POUR BTL (IV SOLUTION) ×2 IMPLANT
PACK BASIN DAY SURGERY FS (CUSTOM PROCEDURE TRAY) ×2 IMPLANT
PENCIL SMOKE EVACUATOR (MISCELLANEOUS) ×2 IMPLANT
PLUG CATH AND CAP STER (CATHETERS) ×2 IMPLANT
PROS TITN NRW BS INFRA 18 (Erectile Restoration) ×2 IMPLANT
PROSTHESIS TITN NRW INFRA 18CM (Erectile Restoration) ×1 IMPLANT
RESERVOIR 75CC LOCKOUT BIOFLEX (Erectile Restoration) ×2 IMPLANT
RETRACTOR STAY HOOK 5MM (MISCELLANEOUS) IMPLANT
RETRACTOR STERILE 25.8CMX11.3 (INSTRUMENTS) IMPLANT
RETRACTOR WILSON SYSTEM (INSTRUMENTS) IMPLANT
SPONGE LAP 4X18 RFD (DISPOSABLE) ×2 IMPLANT
STRIP CLOSURE SKIN 1/2X4 (GAUZE/BANDAGES/DRESSINGS) IMPLANT
SUPPORT SCROTAL LG STRP (MISCELLANEOUS) ×2 IMPLANT
SURGILUBE 2OZ TUBE FLIPTOP (MISCELLANEOUS) ×2 IMPLANT
SUT MNCRL AB 4-0 PS2 18 (SUTURE) ×2 IMPLANT
SUT VIC AB 2-0 UR5 27 (SUTURE) ×14 IMPLANT
SUT VIC AB 5-0 PS2 18 (SUTURE) IMPLANT
SUT VICRYL 0 27 CT2 27 ABS (SUTURE) IMPLANT
SYR 10ML LL (SYRINGE) ×4 IMPLANT
SYR 20ML LL LF (SYRINGE) ×2 IMPLANT
SYR 50ML LL SCALE MARK (SYRINGE) ×4 IMPLANT
SYR BULB IRRIG 60ML STRL (SYRINGE) IMPLANT
TOWEL OR 17X26 10 PK STRL BLUE (TOWEL DISPOSABLE) ×2 IMPLANT
TRAY DSU PREP LF (CUSTOM PROCEDURE TRAY) ×2 IMPLANT
TRAY FOL W/BAG SLVR 16FR STRL (SET/KITS/TRAYS/PACK) ×1 IMPLANT
TRAY FOLEY W/BAG SLVR 16FR LF (SET/KITS/TRAYS/PACK) ×2
TUBE CONNECTING 12X1/4 (SUCTIONS) ×2 IMPLANT
WATER STERILE IRR 500ML POUR (IV SOLUTION) ×2 IMPLANT
YANKAUER SUCT BULB TIP NO VENT (SUCTIONS) ×4 IMPLANT

## 2020-06-23 NOTE — Interval H&P Note (Signed)
History and Physical Interval Note:  06/23/2020 10:09 AM  Judge Stall  has presented today for surgery, with the diagnosis of ERECTILE DYSFUNCTION POST PROSTATECTOMY.  The various methods of treatment have been discussed with the patient and family. After consideration of risks, benefits and other options for treatment, the patient has consented to  Procedure(s) with comments: [place] (N/A) - 2 HRS as a surgical intervention.  The patient's history has been reviewed, patient examined, no change in status, stable for surgery.  I have reviewed the patient's chart and labs.  Questions were answered to the patient's satisfaction.     Lillette Boxer Darcus Edds

## 2020-06-23 NOTE — Op Note (Signed)
PATIENT:  Kevin [name]  PRE-OPERATIVE DIAGNOSIS:  Organic erectile dysfunction  POST-OPERATIVE DIAGNOSIS:  Same  PROCEDURE:  Procedure(s): 3 piece inflatable penile prosthesis Surveyor, minerals)  SURGEON:  Lillette Boxer. Cuyler Vandyken, MD  ASST: Celene Squibb, MD  INDICATION: He has had long-standing organic erectile dysfunction and has been treated with, unsuccessfully, with oral agents, vacuum erection device and intracavernosal injection therapy without success. He has elected to proceed with prosthesis implantation.  ANESTHESIA:  General  EBL:  Minimal  Device: 3 piece Titan: 75 cc reservoir, 18 cm cylinders  on right and left sides  LOCAL MEDICATIONS USED:  None  SPECIMEN: None  DISPOSITION OF SPECIMEN:  N/A  Description of procedure: The patient was taken to the major operating room, placed on the table and administered general anesthesia in the supine position. His genitalia was then scrubbed for 10 minutes, painted and then sterilely draped. An official timeout was then performed.  A Toomey syringe was utilized to irrigate the urethra with Betadine.  A 16 French Foley catheter was then placed in the bladder and the bladder was drained and the catheter was plugged. A midline infrapubic incision was then made and then blunt dissection was used to expose the corpora cavernosa bilaterally. This was further cleared of overlying tissue using sharp technique. 2-0 Vicryl sutures were then placed anterolaterally in the corpus cavernosum to serve as stay sutures and then an incision was then made in the corpus cavernosum first on the left-hand side with the knife and then extended proximally and distally with the curved Mayo scissors. I then dilated the corpus cavernosum with the Mentor dilators starting at 8 and progressing to 11 proximally and distally. I then irrigated the corpus cavernosum with antibiotic solution and measured the distance proximally and distally from the stay suture and was found  to be 7 and 11 cm, respectively.I then turned my attention to the contralateral corpus cavernosum and placed my stay sutures, made my corporotomy and dilated the corpus cavernosum in an identical fashion. This was measured and also was found to be 7 cm proximally and 11 distally. It was irrigated with anastomotic solution as was the scrotum. I then chose an 18 cm cylinder set  and these were prepped while I prepared the site for reservoir placement.  Incision was then made in the lower rectus fascia.  I  then developed a space behind the symphysis pubis for the reservoir. I irrigated the space with anastomotic solution and then placed the reservoir in this location. I then filled the reservoir with 75 cc of sterile saline.  Attention was redirected to the corporotomies where the cylinders were then placed by first fixing the suture to the distal aspect of the right cylinder to a straight needle. This was then loaded on the [facility] inserter and passed through the corporotomy and distally. I then advanced the straight needle with the Furlow inserter out through the glans and this was grasped with a hemostat and pulled through the glans and the suture was secured with a hemostat. I then performed an identical maneuver on the contralateral side. After this was performed I irrigated both corpus cavernosum and inserted the distal portion of the cylinder through the corporotomies and pulled this to the end of the corpora with the suture. The proximal aspect with the rear-tip extender was then passed through the corporotomy and into the seated position on each side. I then connected reservoir tubing to a syringe filled with sterile saline and inflated the device. I noted  a good straight erection with both cylinders equidistant under the glans and no buckling of the cylinders. I therefore deflated the device and closed the corporotomies with running 2-0 Vicryl suture. The cylinder was then connected to the pump after  excising the excess tubing with appropriate shodded hemostats in place and then I used the supplied connectors to make the connection. I then again cycled the device with the pump and it cycled properly. I deflated the device and pumped it up about three quarters of the way to aid with hemostasis. I irrigated the scrotum once again with metabolic solution.  I then developed the scrotal pocket in the right hemiscrotum in place the pump in this location. I irrigated the wound one last time with antibiotic irrigation and then closed the deep scrotal tissue over the tubing and pump with running 2-0 vicryl suture. A second layer was then closed over this first layer with running 2- 0 vicryl, and running subcutcular suture w/ 4-0 monocryl performed. Incision dressed with dermabond.  Fluff dressing placed over scrotum/penis and supporter placed. The catheter was connected to closed system drainage and the patient was awakened and taken recovery room in stable and satisfactory condition. He tolerated the procedure well and there were no intraoperative complications. Needle sponge and instrument counts were correct at the end of the operation.

## 2020-06-23 NOTE — Anesthesia Preprocedure Evaluation (Addendum)
Anesthesia Evaluation  Patient identified by MRN, date of birth, ID band Patient awake    Reviewed: Patient's Chart, lab work & pertinent test results  Airway Mallampati: II  TM Distance: >3 FB Neck ROM: Full    Dental  (+) Teeth Intact   Pulmonary neg pulmonary ROS, sleep apnea and Continuous Positive Airway Pressure Ventilation , former smoker,    Pulmonary exam normal        Cardiovascular hypertension, Pt. on medications  Rhythm:Regular Rate:Normal     Neuro/Psych negative neurological ROS  negative psych ROS   GI/Hepatic negative GI ROS, Neg liver ROS,   Endo/Other  negative endocrine ROS  Renal/GU negative Renal ROS   ED s/p prostatectomy for prostate CA 2017    Musculoskeletal negative musculoskeletal ROS (+)   Abdominal Normal abdominal exam  (+)  Abdomen: soft. Bowel sounds: normal.  Peds negative pediatric ROS (+)  Hematology T-cell lymphoma dx 2016   Anesthesia Other Findings   Reproductive/Obstetrics                            Anesthesia Physical Anesthesia Plan  ASA: II  Anesthesia Plan: General   Post-op Pain Management:    Induction:   PONV Risk Score and Plan: 2 and Ondansetron and Dexamethasone  Airway Management Planned: Mask, LMA and Oral ETT  Additional Equipment: None  Intra-op Plan:   Post-operative Plan: Extubation in OR  Informed Consent: I have reviewed the patients History and Physical, chart, labs and discussed the procedure including the risks, benefits and alternatives for the proposed anesthesia with the patient or authorized representative who has indicated his/her understanding and acceptance.     Dental advisory given  Plan Discussed with: CRNA and Surgeon  Anesthesia Plan Comments: (Covid-19 Nucleic Acid Test Results Lab Results      Component                Value               Date                      [place]               NEGATIVE            06/21/2020                [place]              Not Detected        09/30/2019          )        Anesthesia Quick Evaluation

## 2020-06-23 NOTE — Anesthesia Postprocedure Evaluation (Signed)
Anesthesia Post Note  Patient: Kevin [name]  Procedure(s) Performed: PENILE PROTHESIS INFLATABLE (N/A Penis)     Patient location during evaluation: PACU Anesthesia Type: General Level of consciousness: awake and alert Pain management: pain level controlled Vital Signs Assessment: post-procedure vital signs reviewed and stable Respiratory status: spontaneous breathing, nonlabored ventilation, respiratory function stable and patient connected to nasal cannula oxygen Cardiovascular status: blood pressure returned to baseline and stable Postop Assessment: no apparent nausea or vomiting Anesthetic complications: no   No complications documented.  Last Vitals:  Vitals:   06/23/20 1315 06/23/20 1330  BP: (!) 140/98   Pulse: 68   Resp: 12   Temp:  36.7 C  SpO2: 97%     Last Pain:  Vitals:   06/23/20 1315  TempSrc:   PainSc: 5                  Hilda Rynders P Syna Gad

## 2020-06-23 NOTE — Transfer of Care (Signed)
Immediate Anesthesia Transfer of Care Note  Patient: Kevin [name]  Procedure(s) Performed: PENILE PROTHESIS INFLATABLE (N/A Penis)  Patient Location: PACU  Anesthesia Type:General  Level of Consciousness: awake, alert  and oriented  Airway & Oxygen Therapy: Patient Spontanous Breathing and Patient connected to nasal cannula oxygen  Post-op Assessment: Report given to RN and Post -op Vital signs reviewed and stable  Post vital signs: Reviewed and stable  Last Vitals:  Vitals Value Taken Time  BP 166/112 06/23/20 1245  Temp    Pulse 71 06/23/20 1246  Resp 16 06/23/20 1246  SpO2 94 % 06/23/20 1246  Vitals shown include unvalidated device data.  Last Pain:  Vitals:   06/23/20 0851  TempSrc: Oral  PainSc: 0-No pain      Patients Stated Pain Goal: 4 (41/44/36 0165)  Complications: No complications documented.

## 2020-06-23 NOTE — H&P (Signed)
H&P  Chief Complaint: ED  History of Present Illness: 64 yo male w/ h/o PCa--s/p RRP as well as salvage EBRT for biochemical recurrence. He has intractable ED and has failed previous Rxs (PDE5 inhibitors/injections) and presents for placement of 3 piece IPP.  Past Medical History:  Diagnosis Date  . Erectile dysfunction   . Hypertension   . 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 Prostate cancer ([place]) 2017  . Rosacea   . Sleep apnea    uses cpap does not know settings    Past Surgical History:  Procedure Laterality Date  . CHOLECYSTECTOMY  1994 [id number]  . CYSTOSCOPY W/ RETROGRADES Left 01/20/2017   Procedure: CYSTOSCOPY WITH RETROGRADE PYELOGRAM, LEFT STENT PLACEMENT(STRING OFF);  Surgeon: Raynelle Bring, MD;  Location: [facility];  Service: Urology;  Laterality: Left;  . CYSTOSCOPY WITH RETROGRADE PYELOGRAM, URETEROSCOPY AND STENT PLACEMENT Left 01/17/2017   Procedure: CYSTOSCOPY WITH RETROGRADE PYELOGRAM, URETEROSCOPY, HOLMIUM LASER AND  LEFT STENT PLACEMENT;  Surgeon: Franchot Gallo, MD;  Location: [facility];  Service: Urology;  Laterality: Left;  . PROSTATE SURGERY  2017  . receiving chronic interferon     . TONSILLECTOMY  as child   adenoids removed  . URETERAL STENT PLACEMENT    . wisdom  teeth extraction  1990    Home Medications:    Allergies: No Known Allergies  Family History  Problem Relation Age of Onset  . Stroke Father   . Hypertension Father   . Head & neck cancer Father   . Hypertension Mother   . Skin cancer Mother   . Prostate cancer Paternal Uncle        prostate ca  . Colon cancer Maternal Grandmother   . Breast cancer Neg Hx     Social History:  reports that he quit smoking about 5 years ago. His smoking use included cigarettes. He has a 5.00 pack-year smoking history. He has never used smokeless tobacco. He reports current alcohol use. He reports current drug  use. Drug: Marijuana.  ROS: A complete review of systems was performed.  All systems are negative except for pertinent findings as noted.  Physical Exam:  Vital signs in last 24 hours: Ht 6' (1.829 m)   Wt 93 kg   BMI 27.80 kg/m  Constitutional:  Alert and oriented, No acute distress Cardiovascular: Regular rate  Respiratory: Normal respiratory effort GI: Abdomen is soft, nontender, nondistended, no abdominal masses. No CVAT.  Genitourinary: Normal male phallus, testes are descended bilaterally and non-tender and without masses, scrotum is normal in appearance without lesions or masses, perineum is normal on inspection. Lymphatic: No lymphadenopathy Neurologic: Grossly intact, no focal deficits Psychiatric: Normal mood and affect  Laboratory Data:  No results for input(s): WBC, HGB, HCT, PLT in the last 72 hours.  No results for input(s): NA, K, CL, GLUCOSE, BUN, CALCIUM, CREATININE in the last 72 hours.  Invalid input(s): CO3   No results found for this or any previous visit (from the past 24 hour(s)). Recent Results (from the past 240 hour(s))  SARS CORONAVIRUS 2 (TAT 6-24 HRS) Nasopharyngeal Nasopharyngeal Swab     Status: None   Collection Time: 06/21/20  8:55 AM   Specimen: Nasopharyngeal Swab  Result Value Ref Range Status   SARS Coronavirus 2 NEGATIVE NEGATIVE Final    Comment: (NOTE) SARS-CoV-2 target nucleic acids are NOT DETECTED.  The SARS-CoV-2 RNA is generally detectable in upper and  lower respiratory specimens during the acute phase of infection. Negative results do not preclude SARS-CoV-2 infection, do not rule out co-infections with other pathogens, and should not be used as the sole basis for treatment or other patient management decisions. Negative results must be combined with clinical observations, patient history, and epidemiological information. The expected result is Negative.  Fact Sheet for  Patients: SugarRoll.be  Fact Sheet for Healthcare Providers: https://www.woods-mathews.com/  This test is not yet approved or cleared by the [place] FDA and  has been authorized for detection and/or diagnosis of SARS-CoV-2 by FDA under an Emergency Use Authorization (EUA). This EUA will remain  in effect (meaning this test can be used) for the duration of the COVID-19 declaration under Se ction 564(b)(1) of the Act, 21 U.S.C. section 360bbb-3(b)(1), unless the authorization is terminated or revoked sooner.  Performed at [facility], [place] [address], [place], [place] [address]     Renal Function: No results for input(s): CREATININE in the last 168 hours. CrCl cannot be calculated (Patient's most recent lab result is older than the maximum 21 days allowed.).  Radiologic Imaging: No results found.  Impression/Assessment:  ED  Plan:  Placement of IPP

## 2020-06-23 NOTE — Anesthesia Procedure Notes (Signed)
Procedure Name: Intubation Date/Time: 06/23/2020 10:38 AM Performed by: Briani Maul D, CRNA Pre-anesthesia Checklist: Patient identified, Emergency Drugs available, Suction available and Patient being monitored Patient Re-evaluated:Patient Re-evaluated prior to induction Oxygen Delivery Method: Circle system utilized Preoxygenation: Pre-oxygenation with 100% oxygen Induction Type: IV induction Ventilation: Mask ventilation without difficulty Laryngoscope Size: Mac and 4 Grade View: Grade II Tube type: Oral Tube size: 7.5 mm Number of attempts: 1 Airway Equipment and Method: Stylet Placement Confirmation: ETT inserted through vocal cords under direct vision,  positive ETCO2 and breath sounds checked- equal and bilateral Secured at: 22 cm Tube secured with: Tape Dental Injury: Teeth and Oropharynx as per pre-operative assessment

## 2020-06-23 NOTE — Discharge Instructions (Signed)
Penile prosthesis postoperative instructions ° °Wound: ° °In most cases your incision will have absorbable sutures that will dissolve within the first 10-20 days. Some will fall out even earlier. Expect some redness as the sutures dissolved but this should occur only around the sutures. If there is generalized redness, especially with increasing pain or swelling, let us know. The scrotum and penis will very likely get "black and blue" as the blood in the tissues spread. Sometimes the whole scrotum will turn colors. The black and blue is followed by a yellow and brown color. In time, all the discoloration will go away. In some cases some firm swelling in the area of the testicle and pump may persist for up to 4-6 weeks after the surgery and is considered normal in most cases. ° °Diet: ° °You may return to your normal diet within 24 hours following your surgery. You may note some mild nausea and possibly vomiting the first 6-8 hours following surgery. This is usually due to the side effects of anesthesia, and will disappear quite soon. I would suggest clear liquids and a very light meal the first evening following your surgery. ° °Activity: ° °Your physical activity should be restricted the first 48 hours. During that time you should remain relatively inactive, moving about only when necessary. During the first 7-10 days following surgery he should avoid lifting any heavy objects (anything greater than 15 pounds), and avoid strenuous exercise. If you work, ask us specifically about your restrictions, both for work and home. We will write a note to your employer if needed. ° °You should plan to wear a tight pair of jockey shorts or an athletic supporter for the first 4-5 days, even to sleep. This will keep the scrotum immobilized to some degree and keep the swelling down.The position of your penis will determine what is most comfortable but I strongly urge you to keep the penis in the "up" position (toward your  head). ° °Ice packs should be placed on and off over the scrotum for the first 48 hours. Frozen peas or corn in a ZipLock bag can be frozen, used and re-frozen. Fifteen minutes on and 15 minutes off is a reasonable schedule. The ice is a good pain reliever and keeps the swelling down. ° °Hygiene: ° °You may shower 48 hours after your surgery. Tub bathing should be restricted until the seventh day. ° °Medication: ° °You will be sent home with some type of pain medication. In many cases you will be sent home with a narcotic pain pill (hydrocodone or oxycodone). If the pain is not too bad, you may take either Tylenol (acetaminophen) or Advil (ibuprofen) which contain no narcotic agents, and might be tolerated a little better, with fewer side effects. If the pain medication you are sent home with does not control the pain, you will have to let us know. Some narcotic pain medications cannot be given or refilled by a phone call to a pharmacy. ° °Problems you should report to us: ° °· Fever of 101.0 degrees Fahrenheit or greater. °· Moderate or severe swelling under the skin incision or involving the scrotum. °Drug reaction such as hives, a rash, nausea or vomiting. °

## 2020-06-24 DIAGNOSIS — F17210 Nicotine dependence, cigarettes, uncomplicated: Secondary | ICD-10-CM | POA: Diagnosis not present

## 2020-06-24 DIAGNOSIS — N5201 Erectile dysfunction due to arterial insufficiency: Secondary | ICD-10-CM | POA: Diagnosis not present

## 2020-06-24 DIAGNOSIS — N529 Male erectile dysfunction, unspecified: Secondary | ICD-10-CM | POA: Diagnosis not present

## 2020-06-24 DIAGNOSIS — I10 Essential (primary) hypertension: Secondary | ICD-10-CM | POA: Diagnosis not present

## 2020-06-24 DIAGNOSIS — Z8546 Personal history of malignant neoplasm of prostate: Secondary | ICD-10-CM | POA: Diagnosis not present

## 2020-06-24 LAB — PSA: Prostatic Specific Antigen: <0.01 ng/mL (ref 0.00–4.00)

## 2020-06-24 MED ORDER — OXYCODONE HCL 5 MG PO TABS
ORAL_TABLET | ORAL | Status: AC
  Filled 2020-06-24: qty 1

## 2020-06-24 MED ORDER — CIPROFLOXACIN HCL 250 MG PO TABS: 250.0000 mg | ORAL_TABLET | Freq: Two times a day (BID) | ORAL | 0 refills | Status: DC

## 2020-06-24 MED ORDER — CIPROFLOXACIN IN D5W 400 MG/200ML IV SOLN
INTRAVENOUS | Status: AC
  Filled 2020-06-24: qty 200

## 2020-06-24 MED ORDER — HYDROCODONE-ACETAMINOPHEN 5-325 MG PO TABS
1.0000 | ORAL_TABLET | ORAL | 0 refills | Status: DC | PRN
Start: 2020-06-24 — End: 2020-11-16

## 2020-06-24 NOTE — Discharge Summary (Signed)
Patient ID: Kevin [name] MRN: [id number] DOB/AGE: November 17, 1955 64 y.o.  Admit date: 06/23/2020 Discharge date: 06/24/2020  Primary Care Physician:  Josetta Huddle, MD  Discharge Diagnoses:  . Erectile dysfunction due to arterial insufficiency Present on Admission: . Erectile dysfunction due to arterial insufficiency     Discharge Medications: Allergies as of 06/24/2020   No Known Allergies     Medication List    STOP taking these medications   fluconazole 200 MG tablet Commonly known as: DIFLUCAN     TAKE these medications   amLODipine-olmesartan 5-20 MG tablet Commonly known as: AZOR Take by mouth.   ciprofloxacin 250 MG tablet Commonly known as: Cipro Take 1 tablet (250 mg total) by mouth 2 (two) times daily.   citalopram 20 MG tablet Commonly known as: CELEXA Take 20 mg by mouth at bedtime.   HYDROcodone-acetaminophen 5-325 MG tablet Commonly known as: NORCO/VICODIN Take 1 tablet by mouth every 4 (four) hours as needed for moderate pain.   Konsyl-D 52.3 % Powd Generic drug: Psyllium Take by mouth. metamucil   metroNIDAZOLE 1 % gel Commonly known as: METROGEL Apply topically as needed.   milk thistle 175 MG tablet Take 175 mg by mouth in the morning and at bedtime.   peginterferon alfa-2a 180 MCG/ML injection Commonly known as: PEGASYS Inject 180 mcg into the skin every 7 (seven) days.   Red Yeast Rice Extract 600 MG Caps Take 1 capsule by mouth in the morning and at bedtime.   triamcinolone cream 0.1 % Commonly known as: KENALOG Apply topically. 2 weeks on 1 week off daily        Significant Diagnostic Studies:  No results found.  Brief H and P: For complete details please refer to admission H and P, but in brief pt admitted for placement of [place] Hospital Course: No postop issues Active Problems:   Erectile dysfunction due to arterial insufficiency   Day of Discharge BP 130/83 (BP Location: Right Arm)   Pulse 72   Temp (!) 97.5 F  (36.4 C)   Resp 16   Ht 6' (1.829 m)   Wt 95.1 kg   SpO2 98%   BMI 28.43 kg/m   Results for orders placed or performed during the hospital encounter of 06/23/20 (from the past 24 hour(s))  I-STAT, chem 8     Status: Abnormal   Collection Time: 06/23/20  8:58 AM  Result Value Ref Range   Sodium 140 135 - 145 mmol/L   Potassium 4.5 3.5 - 5.1 mmol/L   Chloride 106 98 - 111 mmol/L   BUN 17 8 - 23 mg/dL   Creatinine, Ser 0.90 0.61 - 1.24 mg/dL   Glucose, Bld 108 (H) 70 - 99 mg/dL   Calcium, Ion 1.28 1.15 - 1.40 mmol/L   TCO2 24 22 - 32 mmol/L   Hemoglobin 15.0 13.0 - 17.0 g/dL   HCT 44.0 39 - 52 %  PSA     Status: None   Collection Time: 06/24/20  6:40 AM  Result Value Ref Range   Prostatic Specific Antigen <0.01 0.00 - 4.00 ng/mL    Physical Exam: General: Alert and awake oriented x3 not in any acute distress. HEENT: anicteric sclera, pupils reactive to light and accommodation CVS: S1-S2 clear no murmur rubs or gallops Chest: clear to auscultation bilaterally, no wheezing rales or rhonchi Abdomen: soft nontender, nondistended, normal bowel sounds, no organomegaly Extremities: no cyanosis, clubbing or edema noted bilaterally Neuro: Cranial nerves II-XII intact, no focal neurological  deficits  Disposition:  Home  Diet:  Regular  Activity:  Discussed w/ pt   TESTS THAT NEED FOLLOW-UP   N/A  DISCHARGE FOLLOW-UP   Follow-up Information    [facility].   Why: 10.11.2021 @ 3:30 pm Contact information: [address] [phone]              Time spent on Discharge:   10 mons  Signed: Lillette Boxer Tayshun Gappa 06/24/2020, 8:01 AM

## 2020-06-25 LAB — TESTOSTERONE: Testosterone: 307 ng/dL (ref 264–916)

## 2020-06-27 ENCOUNTER — Encounter (HOSPITAL_BASED_OUTPATIENT_CLINIC_OR_DEPARTMENT_OTHER): Payer: Self-pay | Admitting: Urology

## 2020-07-07 DIAGNOSIS — N529 Male erectile dysfunction, unspecified: Secondary | ICD-10-CM | POA: Diagnosis not present

## 2020-07-07 DIAGNOSIS — Z8546 Personal history of malignant neoplasm of prostate: Secondary | ICD-10-CM | POA: Diagnosis not present

## 2020-07-07 DIAGNOSIS — Z08 Encounter for follow-up examination after completed treatment for malignant neoplasm: Secondary | ICD-10-CM | POA: Diagnosis not present

## 2020-07-13 DIAGNOSIS — N5201 Erectile dysfunction due to arterial insufficiency: Secondary | ICD-10-CM | POA: Diagnosis not present

## 2020-07-22 DIAGNOSIS — N5201 Erectile dysfunction due to arterial insufficiency: Secondary | ICD-10-CM | POA: Diagnosis not present

## 2020-08-03 DIAGNOSIS — C61 Malignant neoplasm of prostate: Secondary | ICD-10-CM | POA: Diagnosis not present

## 2020-08-04 DIAGNOSIS — G4733 Obstructive sleep apnea (adult) (pediatric): Secondary | ICD-10-CM | POA: Diagnosis not present

## 2020-08-12 DIAGNOSIS — Z20822 Contact with and (suspected) exposure to covid-19: Secondary | ICD-10-CM | POA: Diagnosis not present

## 2020-08-26 DIAGNOSIS — N5231 Erectile dysfunction following radical prostatectomy: Secondary | ICD-10-CM | POA: Diagnosis not present

## 2020-08-26 DIAGNOSIS — R3121 Asymptomatic microscopic hematuria: Secondary | ICD-10-CM | POA: Diagnosis not present

## 2020-08-29 DIAGNOSIS — N5231 Erectile dysfunction following radical prostatectomy: Secondary | ICD-10-CM | POA: Diagnosis not present

## 2020-09-12 DIAGNOSIS — R3121 Asymptomatic microscopic hematuria: Secondary | ICD-10-CM | POA: Diagnosis not present

## 2020-09-12 DIAGNOSIS — N202 Calculus of kidney with calculus of ureter: Secondary | ICD-10-CM | POA: Diagnosis not present

## 2020-09-14 DIAGNOSIS — R3121 Asymptomatic microscopic hematuria: Secondary | ICD-10-CM | POA: Diagnosis not present

## 2020-09-19 DIAGNOSIS — C8400 Mycosis fungoides, unspecified site: Secondary | ICD-10-CM | POA: Diagnosis not present

## 2020-09-19 DIAGNOSIS — C44712 Basal cell carcinoma of skin of right lower limb, including hip: Secondary | ICD-10-CM | POA: Diagnosis not present

## 2020-09-19 DIAGNOSIS — D225 Melanocytic nevi of trunk: Secondary | ICD-10-CM | POA: Diagnosis not present

## 2020-09-19 DIAGNOSIS — L821 Other seborrheic keratosis: Secondary | ICD-10-CM | POA: Diagnosis not present

## 2020-09-19 DIAGNOSIS — D1801 Hemangioma of skin and subcutaneous tissue: Secondary | ICD-10-CM | POA: Diagnosis not present

## 2020-09-19 DIAGNOSIS — D485 Neoplasm of uncertain behavior of skin: Secondary | ICD-10-CM | POA: Diagnosis not present

## 2020-09-27 DIAGNOSIS — Z20822 Contact with and (suspected) exposure to covid-19: Secondary | ICD-10-CM | POA: Diagnosis not present

## 2020-09-29 DIAGNOSIS — C44712 Basal cell carcinoma of skin of right lower limb, including hip: Secondary | ICD-10-CM | POA: Diagnosis not present

## 2020-10-12 DIAGNOSIS — N5231 Erectile dysfunction following radical prostatectomy: Secondary | ICD-10-CM | POA: Diagnosis not present

## 2020-10-12 DIAGNOSIS — C61 Malignant neoplasm of prostate: Secondary | ICD-10-CM | POA: Diagnosis not present

## 2020-10-12 DIAGNOSIS — R3121 Asymptomatic microscopic hematuria: Secondary | ICD-10-CM | POA: Diagnosis not present

## 2020-10-18 DIAGNOSIS — Z79899 Other long term (current) drug therapy: Secondary | ICD-10-CM | POA: Diagnosis not present

## 2020-10-18 DIAGNOSIS — C8400 Mycosis fungoides, unspecified site: Secondary | ICD-10-CM | POA: Diagnosis not present

## 2020-10-20 DIAGNOSIS — H33302 Unspecified retinal break, left eye: Secondary | ICD-10-CM | POA: Diagnosis not present

## 2020-10-20 DIAGNOSIS — H2513 Age-related nuclear cataract, bilateral: Secondary | ICD-10-CM | POA: Diagnosis not present

## 2020-10-20 DIAGNOSIS — H5203 Hypermetropia, bilateral: Secondary | ICD-10-CM | POA: Diagnosis not present

## 2020-11-10 DIAGNOSIS — R310 Gross hematuria: Secondary | ICD-10-CM | POA: Diagnosis not present

## 2020-11-10 DIAGNOSIS — T83490A Other mechanical complication of penile (implanted) prosthesis, initial encounter: Secondary | ICD-10-CM | POA: Diagnosis not present

## 2020-11-14 ENCOUNTER — Inpatient Hospital Stay (HOSPITAL_COMMUNITY)
Admission: AD | Admit: 2020-11-14 | Discharge: 2020-11-16 | DRG: 655 | Disposition: A | Discharge status: Home / Self Care | Payer: BC Managed Care – PPO | Source: Ambulatory Visit | Attending: Urology | Admitting: Urology

## 2020-11-14 ENCOUNTER — Other Ambulatory Visit: Payer: Self-pay

## 2020-11-14 ENCOUNTER — Encounter (HOSPITAL_COMMUNITY)
Admission: AD | Disposition: A | Discharge status: Home / Self Care | Payer: Self-pay | Source: Ambulatory Visit | Attending: Urology

## 2020-11-14 ENCOUNTER — Observation Stay (HOSPITAL_COMMUNITY): Payer: BC Managed Care – PPO | Admitting: Certified Registered Nurse Anesthetist

## 2020-11-14 ENCOUNTER — Other Ambulatory Visit: Payer: Self-pay | Admitting: Urology

## 2020-11-14 ENCOUNTER — Encounter (HOSPITAL_COMMUNITY): Payer: Self-pay | Admitting: Urology

## 2020-11-14 DIAGNOSIS — Z923 Personal history of irradiation: Secondary | ICD-10-CM | POA: Diagnosis not present

## 2020-11-14 DIAGNOSIS — I10 Essential (primary) hypertension: Secondary | ICD-10-CM | POA: Diagnosis present

## 2020-11-14 DIAGNOSIS — Z800 Family history of malignant neoplasm of digestive organs: Secondary | ICD-10-CM | POA: Diagnosis not present

## 2020-11-14 DIAGNOSIS — Z8249 Family history of ischemic heart disease and other diseases of the circulatory system: Secondary | ICD-10-CM

## 2020-11-14 DIAGNOSIS — Z9989 Dependence on other enabling machines and devices: Secondary | ICD-10-CM | POA: Diagnosis not present

## 2020-11-14 DIAGNOSIS — G4730 Sleep apnea, unspecified: Secondary | ICD-10-CM | POA: Diagnosis not present

## 2020-11-14 DIAGNOSIS — Z888 Allergy status to other drugs, medicaments and biological substances status: Secondary | ICD-10-CM | POA: Diagnosis not present

## 2020-11-14 DIAGNOSIS — Z808 Family history of malignant neoplasm of other organs or systems: Secondary | ICD-10-CM | POA: Diagnosis not present

## 2020-11-14 DIAGNOSIS — T83410A Breakdown (mechanical) of penile (implanted) prosthesis, initial encounter: Secondary | ICD-10-CM | POA: Diagnosis present

## 2020-11-14 DIAGNOSIS — Z823 Family history of stroke: Secondary | ICD-10-CM | POA: Diagnosis not present

## 2020-11-14 DIAGNOSIS — R310 Gross hematuria: Secondary | ICD-10-CM | POA: Diagnosis present

## 2020-11-14 DIAGNOSIS — Z9079 Acquired absence of other genital organ(s): Secondary | ICD-10-CM

## 2020-11-14 DIAGNOSIS — Z87442 Personal history of urinary calculi: Secondary | ICD-10-CM

## 2020-11-14 DIAGNOSIS — Z8572 Personal history of non-Hodgkin lymphomas: Secondary | ICD-10-CM | POA: Diagnosis not present

## 2020-11-14 DIAGNOSIS — Z20822 Contact with and (suspected) exposure to covid-19: Secondary | ICD-10-CM | POA: Diagnosis not present

## 2020-11-14 DIAGNOSIS — Y732 Prosthetic and other implants, materials and accessory gastroenterology and urology devices associated with adverse incidents: Secondary | ICD-10-CM | POA: Diagnosis present

## 2020-11-14 DIAGNOSIS — Y92019 Unspecified place in single-family (private) house as the place of occurrence of the external cause: Secondary | ICD-10-CM

## 2020-11-14 DIAGNOSIS — Z8546 Personal history of malignant neoplasm of prostate: Secondary | ICD-10-CM | POA: Diagnosis not present

## 2020-11-14 DIAGNOSIS — Z8042 Family history of malignant neoplasm of prostate: Secondary | ICD-10-CM | POA: Diagnosis not present

## 2020-11-14 DIAGNOSIS — Z87891 Personal history of nicotine dependence: Secondary | ICD-10-CM | POA: Diagnosis not present

## 2020-11-14 DIAGNOSIS — N529 Male erectile dysfunction, unspecified: Secondary | ICD-10-CM | POA: Diagnosis present

## 2020-11-14 HISTORY — PX: PENILE PROSTHESIS IMPLANT: SHX240

## 2020-11-14 LAB — SARS CORONAVIRUS 2 BY RT PCR (HOSPITAL ORDER, PERFORMED IN ~~LOC~~ HOSPITAL LAB): SARS Coronavirus 2: NEGATIVE

## 2020-11-14 LAB — CBC WITH DIFFERENTIAL/PLATELET
Abs Immature Granulocytes: 0.01 10*3/uL (ref 0.00–0.07)
Basophils Absolute: 0 10*3/uL (ref 0.0–0.1)
Basophils Relative: 0 %
Eosinophils Absolute: 0.1 10*3/uL (ref 0.0–0.5)
Eosinophils Relative: 2 %
HCT: 36.9 % — ABNORMAL LOW (ref 39.0–52.0)
Hemoglobin: 12.7 g/dL — ABNORMAL LOW (ref 13.0–17.0)
Immature Granulocytes: 0 %
Lymphocytes Relative: 16 %
Lymphs Abs: 0.6 10*3/uL — ABNORMAL LOW (ref 0.7–4.0)
MCH: 30.4 pg (ref 26.0–34.0)
MCHC: 34.4 g/dL (ref 30.0–36.0)
MCV: 88.3 fL (ref 80.0–100.0)
Monocytes Absolute: 0.5 10*3/uL (ref 0.1–1.0)
Monocytes Relative: 13 %
Neutro Abs: 2.7 10*3/uL (ref 1.7–7.7)
Neutrophils Relative %: 69 %
Platelets: 148 10*3/uL — ABNORMAL LOW (ref 150–400)
RBC: 4.18 MIL/uL — ABNORMAL LOW (ref 4.22–5.81)
RDW: 14.2 % (ref 11.5–15.5)
WBC: 3.9 10*3/uL — ABNORMAL LOW (ref 4.0–10.5)
nRBC: 0 % (ref 0.0–0.2)

## 2020-11-14 LAB — COMPREHENSIVE METABOLIC PANEL
ALT: 41 U/L (ref 0–44)
AST: 35 U/L (ref 15–41)
Albumin: 4.3 g/dL (ref 3.5–5.0)
Alkaline Phosphatase: 77 U/L (ref 38–126)
Anion gap: 14 (ref 5–15)
BUN: 13 mg/dL (ref 8–23)
CO2: 20 mmol/L — ABNORMAL LOW (ref 22–32)
Calcium: 9.1 mg/dL (ref 8.9–10.3)
Chloride: 107 mmol/L (ref 98–111)
Creatinine, Ser: 1.04 mg/dL (ref 0.61–1.24)
GFR, Estimated: >60 mL/min (ref >60)
Glucose, Bld: 85 mg/dL (ref 70–99)
Potassium: 3.8 mmol/L (ref 3.5–5.1)
Sodium: 141 mmol/L (ref 135–145)
Total Bilirubin: 0.9 mg/dL (ref 0.3–1.2)
Total Protein: 7.3 g/dL (ref 6.5–8.1)

## 2020-11-14 LAB — SURGICAL PCR SCREEN
MRSA, PCR: NEGATIVE
Staphylococcus aureus: NEGATIVE

## 2020-11-14 LAB — HIV ANTIBODY (ROUTINE TESTING W REFLEX): HIV Screen 4th Generation wRfx: NONREACTIVE

## 2020-11-14 SURGERY — INSERTION, PENILE PROSTHESIS
Anesthesia: General

## 2020-11-14 MED ORDER — DEXTROSE IN LACTATED RINGERS 5 % IV SOLN: INTRAVENOUS | Status: DC

## 2020-11-14 MED ORDER — MIDAZOLAM HCL 2 MG/2ML IJ SOLN
INTRAMUSCULAR | Status: AC
  Filled 2020-11-14: qty 2

## 2020-11-14 MED ORDER — ROCURONIUM BROMIDE 10 MG/ML (PF) SYRINGE
PREFILLED_SYRINGE | INTRAVENOUS | Status: DC | PRN
  Administered 2020-11-14: 80 mg via INTRAVENOUS

## 2020-11-14 MED ORDER — MEPERIDINE HCL 50 MG/ML IJ SOLN: 6.2500 mg | INTRAMUSCULAR | Status: DC | PRN

## 2020-11-14 MED ORDER — GENTAMICIN SULFATE 40 MG/ML IJ SOLN
460.0000 mg | Freq: Once | INTRAVENOUS | Status: AC
  Administered 2020-11-14: 460 mg via INTRAVENOUS
  Filled 2020-11-14: qty 11.5

## 2020-11-14 MED ORDER — OXYCODONE HCL 5 MG/5ML PO SOLN: 5.0000 mg | Freq: Once | ORAL | Status: AC | PRN

## 2020-11-14 MED ORDER — LIDOCAINE HCL (PF) 2 % IJ SOLN
INTRAMUSCULAR | Status: AC
  Filled 2020-11-14: qty 5

## 2020-11-14 MED ORDER — HYDROMORPHONE HCL 1 MG/ML IJ SOLN
0.2500 mg | INTRAMUSCULAR | Status: DC | PRN
  Administered 2020-11-14 (×2): 0.5 mg via INTRAVENOUS

## 2020-11-14 MED ORDER — CEFAZOLIN SODIUM-DEXTROSE 2-4 GM/100ML-% IV SOLN
2.0000 g | Freq: Three times a day (TID) | INTRAVENOUS | Status: DC
  Administered 2020-11-14 – 2020-11-16 (×6): 2 g via INTRAVENOUS
  Filled 2020-11-14 (×7): qty 100

## 2020-11-14 MED ORDER — ONDANSETRON HCL 4 MG/2ML IJ SOLN
INTRAMUSCULAR | Status: DC | PRN
  Administered 2020-11-14: 4 mg via INTRAVENOUS

## 2020-11-14 MED ORDER — AMISULPRIDE (ANTIEMETIC) 5 MG/2ML IV SOLN: 10.0000 mg | Freq: Once | INTRAVENOUS | Status: DC | PRN

## 2020-11-14 MED ORDER — 0.9 % SODIUM CHLORIDE (POUR BTL) OPTIME
TOPICAL | Status: DC | PRN
  Administered 2020-11-14: 1000 mL

## 2020-11-14 MED ORDER — CHLORHEXIDINE GLUCONATE 0.12 % MT SOLN
15.0000 mL | Freq: Once | OROMUCOSAL | Status: AC
  Administered 2020-11-14: 15 mL via OROMUCOSAL

## 2020-11-14 MED ORDER — MIDAZOLAM HCL 2 MG/2ML IJ SOLN
INTRAMUSCULAR | Status: DC | PRN
  Administered 2020-11-14: 2 mg via INTRAVENOUS

## 2020-11-14 MED ORDER — NEOMYCIN-POLYMYXIN B GU 40-200000 IR SOLN: Freq: Once | Status: DC

## 2020-11-14 MED ORDER — PROMETHAZINE HCL 25 MG/ML IJ SOLN: 6.2500 mg | INTRAMUSCULAR | Status: DC | PRN

## 2020-11-14 MED ORDER — EPHEDRINE 5 MG/ML INJ
INTRAVENOUS | Status: AC
  Filled 2020-11-14: qty 20

## 2020-11-14 MED ORDER — ROCURONIUM BROMIDE 10 MG/ML (PF) SYRINGE
PREFILLED_SYRINGE | INTRAVENOUS | Status: AC
  Filled 2020-11-14: qty 10

## 2020-11-14 MED ORDER — FENTANYL CITRATE (PF) 100 MCG/2ML IJ SOLN
INTRAMUSCULAR | Status: AC
  Filled 2020-11-14: qty 2

## 2020-11-14 MED ORDER — HYDROCODONE-ACETAMINOPHEN 5-325 MG PO TABS
1.0000 | ORAL_TABLET | ORAL | Status: DC | PRN
  Administered 2020-11-15 – 2020-11-16 (×7): 1 via ORAL
  Filled 2020-11-14 (×8): qty 1

## 2020-11-14 MED ORDER — AMLODIPINE BESYLATE 5 MG PO TABS
5.0000 mg | ORAL_TABLET | Freq: Every day | ORAL | Status: DC
  Administered 2020-11-15: 5 mg via ORAL
  Filled 2020-11-14: qty 1

## 2020-11-14 MED ORDER — LIDOCAINE HCL (CARDIAC) PF 100 MG/5ML IV SOSY
PREFILLED_SYRINGE | INTRAVENOUS | Status: DC | PRN
  Administered 2020-11-14: 100 mg via INTRAVENOUS

## 2020-11-14 MED ORDER — AMLODIPINE BESYLATE-VALSARTAN 5-160 MG PO TABS: 1.0000 | ORAL_TABLET | Freq: Every day | ORAL | Status: DC

## 2020-11-14 MED ORDER — EPHEDRINE SULFATE-NACL 50-0.9 MG/10ML-% IV SOSY
PREFILLED_SYRINGE | INTRAVENOUS | Status: DC | PRN
  Administered 2020-11-14 (×2): 5 mg via INTRAVENOUS

## 2020-11-14 MED ORDER — OXYCODONE HCL 5 MG PO TABS
ORAL_TABLET | ORAL | Status: AC
  Administered 2020-11-14: 5 mg via ORAL
  Filled 2020-11-14: qty 1

## 2020-11-14 MED ORDER — OXYBUTYNIN CHLORIDE 5 MG PO TABS
5.0000 mg | ORAL_TABLET | Freq: Three times a day (TID) | ORAL | Status: DC | PRN
  Administered 2020-11-14: 5 mg via ORAL
  Filled 2020-11-14: qty 1

## 2020-11-14 MED ORDER — HYDROMORPHONE HCL 1 MG/ML IJ SOLN
INTRAMUSCULAR | Status: AC
  Administered 2020-11-14: 0.5 mg via INTRAVENOUS
  Filled 2020-11-14: qty 1

## 2020-11-14 MED ORDER — ACETAMINOPHEN 10 MG/ML IV SOLN
INTRAVENOUS | Status: AC
  Filled 2020-11-14: qty 100

## 2020-11-14 MED ORDER — PSYLLIUM 52.3 % PO POWD
1.0000 | Freq: Every day | ORAL | Status: DC
Start: 2020-11-15 — End: 2020-11-14

## 2020-11-14 MED ORDER — PSYLLIUM 95 % PO PACK
1.0000 | PACK | Freq: Every day | ORAL | Status: DC
  Administered 2020-11-15: 1 via ORAL
  Filled 2020-11-14 (×2): qty 1

## 2020-11-14 MED ORDER — ACETAMINOPHEN 10 MG/ML IV SOLN
1000.0000 mg | Freq: Once | INTRAVENOUS | Status: AC
  Administered 2020-11-14: 1000 mg via INTRAVENOUS

## 2020-11-14 MED ORDER — IRBESARTAN 150 MG PO TABS
150.0000 mg | ORAL_TABLET | Freq: Every day | ORAL | Status: DC
  Administered 2020-11-15: 150 mg via ORAL
  Filled 2020-11-14: qty 1

## 2020-11-14 MED ORDER — DEXAMETHASONE SODIUM PHOSPHATE 10 MG/ML IJ SOLN
INTRAMUSCULAR | Status: DC | PRN
  Administered 2020-11-14: 8 mg via INTRAVENOUS

## 2020-11-14 MED ORDER — HYDROMORPHONE HCL 1 MG/ML IJ SOLN: 0.5000 mg | INTRAMUSCULAR | Status: DC | PRN

## 2020-11-14 MED ORDER — CITALOPRAM HYDROBROMIDE 20 MG PO TABS
40.0000 mg | ORAL_TABLET | Freq: Every day | ORAL | Status: DC
  Administered 2020-11-15: 40 mg via ORAL
  Filled 2020-11-14: qty 2

## 2020-11-14 MED ORDER — SUGAMMADEX SODIUM 200 MG/2ML IV SOLN
INTRAVENOUS | Status: DC | PRN
  Administered 2020-11-14: 200 mg via INTRAVENOUS

## 2020-11-14 MED ORDER — OXYCODONE HCL 5 MG PO TABS
5.0000 mg | ORAL_TABLET | Freq: Once | ORAL | Status: AC | PRN
Start: 2020-11-14 — End: 2020-11-14

## 2020-11-14 MED ORDER — TRIAMCINOLONE ACETONIDE 0.1 % EX CREA
TOPICAL_CREAM | Freq: Every day | CUTANEOUS | Status: DC
  Filled 2020-11-14: qty 15

## 2020-11-14 MED ORDER — LACTATED RINGERS IV SOLN: INTRAVENOUS | Status: DC

## 2020-11-14 MED ORDER — FLUCONAZOLE 100 MG PO TABS
100.0000 mg | ORAL_TABLET | Freq: Every day | ORAL | Status: DC
  Administered 2020-11-15: 100 mg via ORAL
  Filled 2020-11-14: qty 1

## 2020-11-14 MED ORDER — PROPOFOL 10 MG/ML IV BOLUS
INTRAVENOUS | Status: AC
  Filled 2020-11-14: qty 20

## 2020-11-14 MED ORDER — FENTANYL CITRATE (PF) 100 MCG/2ML IJ SOLN
INTRAMUSCULAR | Status: DC | PRN
  Administered 2020-11-14 (×6): 50 ug via INTRAVENOUS

## 2020-11-14 MED ORDER — PROPOFOL 10 MG/ML IV BOLUS
INTRAVENOUS | Status: DC | PRN
  Administered 2020-11-14: 20 mg via INTRAVENOUS
  Administered 2020-11-14: 180 mg via INTRAVENOUS

## 2020-11-14 SURGICAL SUPPLY — 46 items
BAG URINE DRAIN 2000ML AR STRL (UROLOGICAL SUPPLIES) ×2 IMPLANT
BENZOIN TINCTURE PRP APPL 2/3 (GAUZE/BANDAGES/DRESSINGS) IMPLANT
BLADE HEX COATED 2.75 (ELECTRODE) ×2 IMPLANT
BNDG COHESIVE 2X5 TAN STRL LF (GAUZE/BANDAGES/DRESSINGS) IMPLANT
CATH FOLEY 2WAY SLVR  5CC 16FR (CATHETERS) ×2
CATH FOLEY 2WAY SLVR  5CC 18FR (CATHETERS) ×2
CATH FOLEY 2WAY SLVR 5CC 16FR (CATHETERS) ×1 IMPLANT
CATH FOLEY 2WAY SLVR 5CC 18FR (CATHETERS) ×1 IMPLANT
COVER MAYO STAND STRL (DRAPES) ×2 IMPLANT
COVER SURGICAL LIGHT HANDLE (MISCELLANEOUS) ×2 IMPLANT
COVER WAND RF STERILE (DRAPES) IMPLANT
DERMABOND ADVANCED (GAUZE/BANDAGES/DRESSINGS) ×1
DERMABOND ADVANCED .7 DNX12 (GAUZE/BANDAGES/DRESSINGS) ×1 IMPLANT
DISSECTOR ROUND CHERRY 3/8 STR (MISCELLANEOUS) IMPLANT
DRAIN CHANNEL 15F RND FF 3/16 (WOUND CARE) ×2 IMPLANT
DRAPE LAPAROTOMY T 102X78X121 (DRAPES) ×2 IMPLANT
DRSG TEGADERM 4X4.75 (GAUZE/BANDAGES/DRESSINGS) IMPLANT
EVACUATOR SILICONE 100CC (DRAIN) ×2 IMPLANT
GAUZE SPONGE 4X4 12PLY STRL (GAUZE/BANDAGES/DRESSINGS) IMPLANT
GLOVE BIOGEL M 8.0 STRL (GLOVE) ×12 IMPLANT
GOWN STRL REUS W/TWL XL LVL3 (GOWN DISPOSABLE) ×6 IMPLANT
KIT BASIN OR (CUSTOM PROCEDURE TRAY) ×2 IMPLANT
KIT TURNOVER KIT A (KITS) ×2 IMPLANT
NS IRRIG 1000ML POUR BTL (IV SOLUTION) ×2 IMPLANT
PACK GENERAL/GYN (CUSTOM PROCEDURE TRAY) ×2 IMPLANT
PENCIL SMOKE EVACUATOR (MISCELLANEOUS) ×2 IMPLANT
PLUG CATH AND CAP STER (CATHETERS) IMPLANT
RETRACTOR WILSON SYSTEM (INSTRUMENTS) IMPLANT
SOL PREP POV-IOD 4OZ 10% (MISCELLANEOUS) IMPLANT
SOL PREP PROV IODINE SCRUB 4OZ (MISCELLANEOUS) ×2 IMPLANT
SPONGE LAP 4X18 RFD (DISPOSABLE) ×2 IMPLANT
STRIP CLOSURE SKIN 1/2X4 (GAUZE/BANDAGES/DRESSINGS) IMPLANT
SUPPORT SCROTAL LG STRP (MISCELLANEOUS) IMPLANT
SUT ETHILON 3 0 PS 1 (SUTURE) ×2 IMPLANT
SUT MNCRL AB 4-0 PS2 18 (SUTURE) ×2 IMPLANT
SUT PDS AB 1 CTX 36 (SUTURE) ×4 IMPLANT
SUT PROLENE 0 SH 30 (SUTURE) ×2 IMPLANT
SUT SILK 2 0 30  PSL (SUTURE) ×2
SUT SILK 2 0 30 PSL (SUTURE) ×1 IMPLANT
SUT VIC AB 2-0 SH 27 (SUTURE) ×6
SUT VIC AB 2-0 SH 27X BRD (SUTURE) ×3 IMPLANT
SUT VIC AB 2-0 UR5 27 (SUTURE) IMPLANT
SYR 20ML LL LF (SYRINGE) IMPLANT
SYR 50ML LL SCALE MARK (SYRINGE) IMPLANT
TOWEL OR 17X26 10 PK STRL BLUE (TOWEL DISPOSABLE) ×4 IMPLANT
WATER STERILE IRR 1000ML POUR (IV SOLUTION) IMPLANT

## 2020-11-14 NOTE — Anesthesia Preprocedure Evaluation (Signed)
Anesthesia Evaluation  Patient identified by MRN, date of birth, ID band Patient awake    Reviewed: Patient's Chart, lab work & pertinent test results  Airway Mallampati: II  TM Distance: >3 FB Neck ROM: Full    Dental  (+) Teeth Intact   Pulmonary neg pulmonary ROS, sleep apnea and Continuous Positive Airway Pressure Ventilation , former smoker,    Pulmonary exam normal        Cardiovascular hypertension, Pt. on medications  Rhythm:Regular Rate:Normal     Neuro/Psych negative neurological ROS  negative psych ROS   GI/Hepatic negative GI ROS, Neg liver ROS,   Endo/Other  negative endocrine ROS  Renal/GU negative Renal ROS   ED s/p prostatectomy for prostate CA 2017    Musculoskeletal negative musculoskeletal ROS (+)   Abdominal Normal abdominal exam  (+)  Abdomen: soft. Bowel sounds: normal.  Peds negative pediatric ROS (+)  Hematology T-cell lymphoma dx 2016   Anesthesia Other Findings   Reproductive/Obstetrics                             Anesthesia Physical  Anesthesia Plan  ASA: II and emergent  Anesthesia Plan: General   Post-op Pain Management:    Induction: Intravenous  PONV Risk Score and Plan: 2 and Ondansetron, Midazolam and Treatment may vary due to age or medical condition  Airway Management Planned: LMA and Oral ETT  Additional Equipment: None  Intra-op Plan:   Post-operative Plan: Extubation in OR  Informed Consent: I have reviewed the patients History and Physical, chart, labs and discussed the procedure including the risks, benefits and alternatives for the proposed anesthesia with the patient or authorized representative who has indicated his/her understanding and acceptance.     Dental advisory given  Plan Discussed with: CRNA and Surgeon  Anesthesia Plan Comments:         Anesthesia Quick Evaluation

## 2020-11-14 NOTE — Anesthesia Procedure Notes (Signed)
Procedure Name: Intubation Date/Time: 11/14/2020 7:06 PM Performed by: Raenette Rover, CRNA Pre-anesthesia Checklist: Patient identified, Emergency Drugs available, Suction available and Patient being monitored Patient Re-evaluated:Patient Re-evaluated prior to induction Oxygen Delivery Method: Circle system utilized Preoxygenation: Pre-oxygenation with 100% oxygen Induction Type: IV induction Ventilation: Mask ventilation without difficulty and Oral airway inserted - appropriate to patient size Laryngoscope Size: Mac and 4 Grade View: Grade I Tube type: Oral Tube size: 7.5 mm Number of attempts: 1 Airway Equipment and Method: Stylet Placement Confirmation: ETT inserted through vocal cords under direct vision,  positive ETCO2 and breath sounds checked- equal and bilateral Secured at: 23 cm Tube secured with: Tape Dental Injury: Teeth and Oropharynx as per pre-operative assessment

## 2020-11-14 NOTE — Transfer of Care (Signed)
Immediate Anesthesia Transfer of Care Note  Patient: Kevin [name]  Procedure(s) Performed: Excision of prosthesis resevoir, Closure of bladder defect, Suprapubic tube placement (N/A )  Patient Location: PACU  Anesthesia Type:General  Level of Consciousness: awake, alert , oriented and patient cooperative  Airway & Oxygen Therapy: Patient Spontanous Breathing and Patient connected to face mask oxygen  Post-op Assessment: Report given to RN and Post -op Vital signs reviewed and stable  Post vital signs: Reviewed and stable  Last Vitals:  Vitals Value Taken Time  BP 130/96 11/14/20 2118  Temp 36.9 C 11/14/20 2118  Pulse 85 11/14/20 2120  Resp 9 11/14/20 2120  SpO2 97 % 11/14/20 2120  Vitals shown include unvalidated device data.  Last Pain:  Vitals:   11/14/20 1356  TempSrc: Oral  PainSc: 2       Patients Stated Pain Goal: 3 (38/45/36 4680)  Complications: No complications documented.

## 2020-11-14 NOTE — Progress Notes (Signed)
Brief Pharmacy Note:  Gentamycin 460mg  x1 pre-op for surgical prophylaxis Dose basis 5mg /kg, TBW 92.8 kg  Protocol d/c, if further Gentamycin doses needed post-op, please order  Thank you  Minda Ditto PharmD 11/14/2020, 1:45 PM

## 2020-11-14 NOTE — H&P (Signed)
H&P  Chief Complaint: Gross hematuria, eroded prosthesis pump History of Present Illness: 65 year old gentleman, well-known to me with history of prostate cancer, status post radical retropubic prostatectomy with salvage radiotherapy.  He also has a history of left ureteral stone.  He had placement of a Coloplast 3 piece penile prosthesis in September 2021.  Postoperatively he did have sudden onset right hemiscrotal swelling with eventual resolution.  Since that time he has had resolving penile pain.  There has been no evidence of infection of his penile prosthesis.  However, he did develop microscopic hematuria.  This turned into gross hematuria last week, urgent cystoscopy in the office revealed erosion of his reservoir into his bladder on the right anterior wall.  There was surrounding erythema and inflammatory change.  He presented to the office this morning with gross hematuria and urinary retention.  7 French Foley catheter was placed and I irrigated the patient free of small amount of clots.  He is admitted at this point for urgent management of his eroded prosthesis reservoir.  This will involve lower abdominal incision, excision of the prosthesis and closure of the bladder as well as probable placement of suprapubic tube.  I discussed the process with Raydell and his wife, Jaclyn Shaggy.  He desires to proceed.  Past Medical History:  Diagnosis Date  . Erectile dysfunction   . Hypertension   . 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 Prostate cancer ([place]) 2017  . Rosacea   . Sleep apnea    uses cpap does not know settings    Past Surgical History:  Procedure Laterality Date  . CHOLECYSTECTOMY  1994 [id number]  . CYSTOSCOPY W/ RETROGRADES Left 01/20/2017   Procedure: CYSTOSCOPY WITH RETROGRADE PYELOGRAM, LEFT STENT PLACEMENT(STRING OFF);  Surgeon: Raynelle Bring, MD;  Location: [facility];  Service: Urology;  Laterality: Left;   . CYSTOSCOPY WITH RETROGRADE PYELOGRAM, URETEROSCOPY AND STENT PLACEMENT Left 01/17/2017   Procedure: CYSTOSCOPY WITH RETROGRADE PYELOGRAM, URETEROSCOPY, HOLMIUM LASER AND  LEFT STENT PLACEMENT;  Surgeon: Franchot Gallo, MD;  Location: [facility];  Service: Urology;  Laterality: Left;  . PENILE PROSTHESIS IMPLANT N/A 06/23/2020   Procedure: PENILE PROTHESIS INFLATABLE;  Surgeon: Franchot Gallo, MD;  Location: [facility];  Service: Urology;  Laterality: N/A;  2 HRS  . PROSTATE SURGERY  2017  . receiving chronic interferon     . TONSILLECTOMY  as child   adenoids removed  . URETERAL STENT PLACEMENT    . wisdom  teeth extraction  1990    Home Medications:    Allergies:  Allergies  Allergen Reactions  . Lisinopril     Other reaction(s): cough, ED    Family History  Problem Relation Age of Onset  . Stroke Father   . Hypertension Father   . Head & neck cancer Father   . Hypertension Mother   . Skin cancer Mother   . Prostate cancer Paternal Uncle        prostate ca  . Colon cancer Maternal Grandmother   . Breast cancer Neg Hx     Social History:  reports that he quit smoking about 6 years ago. His smoking use included cigarettes. He has a 5.00 pack-year smoking history. He has never used smokeless tobacco. He reports current alcohol use. He reports current drug use. Drug: Marijuana.  ROS: A complete review of systems was performed.  All systems are negative except for pertinent findings  as noted.  Physical Exam:  Vital signs in last 24 hours: BP (!) 129/97 (BP Location: Right Arm)   Pulse 75   Temp 98.6 F (37 C) (Oral)   Resp 16   Ht 6' (1.829 m)   Wt 92.8 kg   SpO2 99%   BMI 27.74 kg/m  Constitutional:  Alert and oriented, No acute distress Cardiovascular: Regular rate  Respiratory: Normal respiratory effort GI: Abdomen is soft, nontender, nondistended, no abdominal masses. No CVAT.  Genitourinary: Normal male phallus,  prosthesis cylinders are adequately placed, nontender, nonindurated, testes are descended bilaterally and non-tender and without masses, scrotum is normal in appearance without lesions or masses, perineum is normal on inspection.  Prosthesis pump is well positioned in the right hemiscrotum dependently and is nontender with no erythema or fluctuance. Lymphatic: No lymphadenopathy Neurologic: Grossly intact, no focal deficits Psychiatric: Normal mood and affect  Laboratory Data:  Recent Labs    11/14/20 1453  WBC 3.9*  HGB 12.7*  HCT 36.9*  PLT 148*    Recent Labs    11/14/20 1453  NA 141  K 3.8  CL 107  GLUCOSE 85  BUN 13  CALCIUM 9.1  CREATININE 1.04     Results for orders placed or performed during the hospital encounter of 11/14/20 (from the past 24 hour(s))  SARS Coronavirus 2 by RT PCR (hospital order, performed in [facility] hospital lab) Nasopharyngeal Nasal Mucosa     Status: None   Collection Time: 11/14/20  1:52 PM   Specimen: Nasal Mucosa; Nasopharyngeal  Result Value Ref Range   SARS Coronavirus 2 NEGATIVE NEGATIVE  Surgical pcr screen     Status: None   Collection Time: 11/14/20  1:52 PM   Specimen: Nasal Mucosa; Nasal Swab  Result Value Ref Range   MRSA, PCR NEGATIVE NEGATIVE   Staphylococcus aureus NEGATIVE NEGATIVE  Comprehensive metabolic panel     Status: Abnormal   Collection Time: 11/14/20  2:53 PM  Result Value Ref Range   Sodium 141 135 - 145 mmol/L   Potassium 3.8 3.5 - 5.1 mmol/L   Chloride 107 98 - 111 mmol/L   CO2 20 (L) 22 - 32 mmol/L   Glucose, Bld 85 70 - 99 mg/dL   BUN 13 8 - 23 mg/dL   Creatinine, Ser 1.04 0.61 - 1.24 mg/dL   Calcium 9.1 8.9 - 10.3 mg/dL   Total Protein 7.3 6.5 - 8.1 g/dL   Albumin 4.3 3.5 - 5.0 g/dL   AST 35 15 - 41 U/L   ALT 41 0 - 44 U/L   Alkaline Phosphatase 77 38 - 126 U/L   Total Bilirubin 0.9 0.3 - 1.2 mg/dL   GFR, Estimated >60 >60 mL/min   Anion gap 14 5 - 15  CBC WITH DIFFERENTIAL     Status:  Abnormal   Collection Time: 11/14/20  2:53 PM  Result Value Ref Range   WBC 3.9 (L) 4.0 - 10.5 K/uL   RBC 4.18 (L) 4.22 - 5.81 MIL/uL   Hemoglobin 12.7 (L) 13.0 - 17.0 g/dL   HCT 36.9 (L) 39.0 - 52.0 %   MCV 88.3 80.0 - 100.0 fL   MCH 30.4 26.0 - 34.0 pg   MCHC 34.4 30.0 - 36.0 g/dL   RDW 14.2 11.5 - 15.5 %   Platelets 148 (L) 150 - 400 K/uL   nRBC 0.0 0.0 - 0.2 %   Neutrophils Relative % 69 %   Neutro Abs 2.7 1.7 - 7.7 K/uL  Lymphocytes Relative 16 %   Lymphs Abs 0.6 (L) 0.7 - 4.0 K/uL   Monocytes Relative 13 %   Monocytes Absolute 0.5 0.1 - 1.0 K/uL   Eosinophils Relative 2 %   Eosinophils Absolute 0.1 0.0 - 0.5 K/uL   Basophils Relative 0 %   Basophils Absolute 0.0 0.0 - 0.1 K/uL   Immature Granulocytes 0 %   Abs Immature Granulocytes 0.01 0.00 - 0.07 K/uL   Recent Results (from the past 240 hour(s))  SARS Coronavirus 2 by RT PCR (hospital order, performed in [place] hospital lab) Nasopharyngeal Nasal Mucosa     Status: None   Collection Time: 11/14/20  1:52 PM   Specimen: Nasal Mucosa; Nasopharyngeal  Result Value Ref Range Status   SARS Coronavirus 2 NEGATIVE NEGATIVE Final    Comment: (NOTE) SARS-CoV-2 target nucleic acids are NOT DETECTED.  The SARS-CoV-2 RNA is generally detectable in upper and lower respiratory specimens during the acute phase of infection. The lowest concentration of SARS-CoV-2 viral copies this assay can detect is 250 copies / mL. A negative result does not preclude SARS-CoV-2 infection and should not be used as the sole basis for treatment or other patient management decisions.  A negative result may occur with improper specimen collection / handling, submission of specimen other than nasopharyngeal swab, presence of viral mutation(s) within the areas targeted by this assay, and inadequate number of viral copies (<250 copies / mL). A negative result must be combined with clinical observations, patient history, and epidemiological  information.  Fact Sheet for Patients:   StrictlyIdeas.no  Fact Sheet for Healthcare Providers: BankingDealers.co.za  This test is not yet approved or  cleared by the [place] FDA and has been authorized for detection and/or diagnosis of SARS-CoV-2 by FDA under an Emergency Use Authorization (EUA).  This EUA will remain in effect (meaning this test can be used) for the duration of the COVID-19 declaration under Section 564(b)(1) of the Act, 21 U.S.C. section 360bbb-3(b)(1), unless the authorization is terminated or revoked sooner.  Performed at [facility], [place] [address], [place], [place] [address]   Surgical pcr screen     Status: None   Collection Time: 11/14/20  1:52 PM   Specimen: Nasal Mucosa; Nasal Swab  Result Value Ref Range Status   MRSA, PCR NEGATIVE NEGATIVE Final   Staphylococcus aureus NEGATIVE NEGATIVE Final    Comment: (NOTE) The Xpert SA Assay (FDA approved for NASAL specimens in patients 40 years of age and older), is one component of a comprehensive surveillance program. It is not intended to diagnose infection nor to guide or monitor treatment. Performed at [facility], [place] [address], [place], [place] [address]     Renal Function: Recent Labs    11/14/20 1453  CREATININE 1.04   Estimated Creatinine Clearance: 80.8 mL/min (by C-G formula based on SCr of 1.04 mg/dL).  Radiologic Imaging: No results found.  Impression/Assessment:  Status post placement of inflatable 3 piece penile prosthesis in September, 2021.  There is erosion of the patient's prosthesis into his bladder with associated gross hematuria  Plan:  Infrapubic incision, exploration, excision/explant of prosthesis reservoir, cystorrhaphy, placement of suprapubic tube.

## 2020-11-14 NOTE — Brief Op Note (Signed)
11/14/2020  9:19 PM  PATIENT:  Kevin [name]  65 y.o. male  PRE-OPERATIVE DIAGNOSIS:  Eroded prosthesis resevoir  POST-OPERATIVE DIAGNOSIS:  Eroded prosthesis resevoir  PROCEDURE:  Procedure(s): Excision of prosthesis resevoir, Closure of bladder defect, Suprapubic tube placement (N/A)  SURGEON:  Surgeon(s) and Role:    * Franchot Gallo, MD - Primary  PHYSICIAN ASSISTANT:   ASSISTANTS: none   ANESTHESIA:   general  EBL:  100 mL   BLOOD ADMINISTERED:none  DRAINS: JP, drain  LOCAL MEDICATIONS USED:  NONE  SPECIMEN:  No Specimen  DISPOSITION OF SPECIMEN:  N/A  COUNTS:  YES  TOURNIQUET:  * No tourniquets in log *  DICTATION: .Dragon Dictation  PLAN OF CARE: Admit to inpatient   PATIENT DISPOSITION:  PACU - hemodynamically stable.   Delay start of Pharmacological VTE agent (>24hrs) due to surgical blood loss or risk of bleeding: no

## 2020-11-14 NOTE — Anesthesia Postprocedure Evaluation (Signed)
Anesthesia Post Note  Patient: Kevin [name]  Procedure(s) Performed: Excision of prosthesis resevoir, Closure of bladder defect, Suprapubic tube placement (N/A )     Patient location during evaluation: PACU Anesthesia Type: General Level of consciousness: awake and alert Pain management: pain level controlled Vital Signs Assessment: post-procedure vital signs reviewed and stable Respiratory status: spontaneous breathing, nonlabored ventilation and respiratory function stable Cardiovascular status: blood pressure returned to baseline and stable Postop Assessment: no apparent nausea or vomiting Anesthetic complications: no   No complications documented.  Last Vitals:  Vitals:   11/14/20 2200 11/14/20 2215  BP: (!) 135/94 (!) 143/91  Pulse: 87 89  Resp: 12 (!) 9  Temp:    SpO2: 96% 96%    Last Pain:  Vitals:   11/14/20 2215  TempSrc:   PainSc: 5                  Lynda Rainwater

## 2020-11-15 ENCOUNTER — Encounter (HOSPITAL_COMMUNITY): Payer: Self-pay | Admitting: Urology

## 2020-11-15 DIAGNOSIS — Z9079 Acquired absence of other genital organ(s): Secondary | ICD-10-CM | POA: Diagnosis not present

## 2020-11-15 DIAGNOSIS — G4730 Sleep apnea, unspecified: Secondary | ICD-10-CM | POA: Diagnosis present

## 2020-11-15 DIAGNOSIS — Z8572 Personal history of non-Hodgkin lymphomas: Secondary | ICD-10-CM | POA: Diagnosis not present

## 2020-11-15 DIAGNOSIS — Z923 Personal history of irradiation: Secondary | ICD-10-CM | POA: Diagnosis not present

## 2020-11-15 DIAGNOSIS — I10 Essential (primary) hypertension: Secondary | ICD-10-CM | POA: Diagnosis present

## 2020-11-15 DIAGNOSIS — Y92019 Unspecified place in single-family (private) house as the place of occurrence of the external cause: Secondary | ICD-10-CM | POA: Diagnosis not present

## 2020-11-15 DIAGNOSIS — Z87891 Personal history of nicotine dependence: Secondary | ICD-10-CM | POA: Diagnosis not present

## 2020-11-15 DIAGNOSIS — Y732 Prosthetic and other implants, materials and accessory gastroenterology and urology devices associated with adverse incidents: Secondary | ICD-10-CM | POA: Diagnosis present

## 2020-11-15 DIAGNOSIS — Z823 Family history of stroke: Secondary | ICD-10-CM | POA: Diagnosis not present

## 2020-11-15 DIAGNOSIS — N529 Male erectile dysfunction, unspecified: Secondary | ICD-10-CM | POA: Diagnosis present

## 2020-11-15 DIAGNOSIS — Z808 Family history of malignant neoplasm of other organs or systems: Secondary | ICD-10-CM | POA: Diagnosis not present

## 2020-11-15 DIAGNOSIS — Z87442 Personal history of urinary calculi: Secondary | ICD-10-CM | POA: Diagnosis not present

## 2020-11-15 DIAGNOSIS — T83410A Breakdown (mechanical) of penile (implanted) prosthesis, initial encounter: Secondary | ICD-10-CM | POA: Diagnosis present

## 2020-11-15 DIAGNOSIS — Z800 Family history of malignant neoplasm of digestive organs: Secondary | ICD-10-CM | POA: Diagnosis not present

## 2020-11-15 DIAGNOSIS — Z8546 Personal history of malignant neoplasm of prostate: Secondary | ICD-10-CM | POA: Diagnosis not present

## 2020-11-15 DIAGNOSIS — Z8249 Family history of ischemic heart disease and other diseases of the circulatory system: Secondary | ICD-10-CM | POA: Diagnosis not present

## 2020-11-15 DIAGNOSIS — Z8042 Family history of malignant neoplasm of prostate: Secondary | ICD-10-CM | POA: Diagnosis not present

## 2020-11-15 DIAGNOSIS — Z888 Allergy status to other drugs, medicaments and biological substances status: Secondary | ICD-10-CM | POA: Diagnosis not present

## 2020-11-15 DIAGNOSIS — Z20822 Contact with and (suspected) exposure to covid-19: Secondary | ICD-10-CM | POA: Diagnosis present

## 2020-11-15 DIAGNOSIS — R310 Gross hematuria: Secondary | ICD-10-CM | POA: Diagnosis present

## 2020-11-15 LAB — CBC
HCT: 34.8 % — ABNORMAL LOW (ref 39.0–52.0)
Hemoglobin: 12 g/dL — ABNORMAL LOW (ref 13.0–17.0)
MCH: 30.9 pg (ref 26.0–34.0)
MCHC: 34.5 g/dL (ref 30.0–36.0)
MCV: 89.7 fL (ref 80.0–100.0)
Platelets: 147 10*3/uL — ABNORMAL LOW (ref 150–400)
RBC: 3.88 MIL/uL — ABNORMAL LOW (ref 4.22–5.81)
RDW: 14.5 % (ref 11.5–15.5)
WBC: 5.9 10*3/uL (ref 4.0–10.5)
nRBC: 0 % (ref 0.0–0.2)

## 2020-11-15 LAB — CREATININE, SERUM
Creatinine, Ser: 0.86 mg/dL (ref 0.61–1.24)
GFR, Estimated: >60 mL/min (ref >60)

## 2020-11-15 LAB — CREATININE, FLUID (PLEURAL, PERITONEAL, JP DRAINAGE): Creat, Fluid: >50 mg/dL

## 2020-11-15 MED ORDER — BISACODYL 5 MG PO TBEC
5.0000 mg | DELAYED_RELEASE_TABLET | Freq: Once | ORAL | Status: AC
  Administered 2020-11-15: 5 mg via ORAL
  Filled 2020-11-15: qty 1

## 2020-11-15 MED ORDER — ENOXAPARIN SODIUM 40 MG/0.4ML ~~LOC~~ SOLN
40.0000 mg | SUBCUTANEOUS | Status: DC
  Administered 2020-11-15: 40 mg via SUBCUTANEOUS
  Filled 2020-11-15: qty 0.4

## 2020-11-15 NOTE — Op Note (Signed)
Preoperative diagnosis: Erosion of reservoir unit of IPP through bladder  Postoperative diagnosis: Same  Principal procedure: Explant of IPP reservoir, cystorrhaphy, placement of 18 French Foley catheter for suprapubic tube  Surgeon: Moriyah Byington  Anesthesia: General endotracheal  Complications: None  Drains: 15 French Blake drain in the retropubic space, 78 French Foley catheter for suprapubic tube  Specimens: Explanted reservoir  Estimated blood loss: 100 mL  Indications: 65 year old male several months postop from placement of Coloplast 3 piece Titan penile prosthesis.  He has had microscopic hematuria for several visits precipitating CT hematuria protocol.  This was normal.  He recently had gross hematuria and cystoscopy in the office revealed an erosion of his reservoir through the bladder.  He presents at this time, urgently upon presentation in the office for clot retention, for explant of his reservoir.  There is no sign of infection of the remaining penile cylinders or right hemiscrotal pump.  For the present time we will just concentrate on explant of his reservoir and closure of his bladder defect.  I discussed the procedure with the patient and his wife as well as eventual procedures down the road including either removal and replacement of his remaining prosthesis or just replacement of his reservoir in an ectopic position.  The understand the procedure, risks and complications and desire to proceed.  Findings: The reservoir was intact, and the retropubic space but there was a significant amount of fibrosis around this in the bladder posterior to the reservoir was quite thinned.  There was a significant amount of clot in the bladder.  Edges of the bladder were friable.  There was no evidence of significant fluid around the remaining tubing.  There was nothing there to culture.  Description of procedure: The patient was properly identified in the holding area.  He had been on  preoperative broad-spectrum antibiotic coverage.  He was taken to the operating room where general anesthetic was administered.  He remained in the supine position.  Lower half of the abdomen, genitalia and perineum were prepped and draped.  Proper timeout was performed.  The IPP was left halfway inflated as instructed by the patient.  I then excised the old infrapubic incision and extended the incision for a distance of approximately 10 cm.  I then dissected down to the rectus fascia with electrocautery.  The fascia was incised.  I then dissected down into the peritoneum and the retropubic space.  The tubing was evident, this was followed down using the cutting current to the reservoir.  The space for the reservoir was then opened.  There was no significant purulent material, but there was bloody urine present.  The reservoir was removed after the tubing was triply ligated with 0 Prolene and then divided.  I then opened this fibrous capsule.  It was copiously irrigated with saline.  The edges of the bladder were quite thinned but evident.  I extended the incision in the bladder superiorly some so that I could inspect the bladder well and remove the clot.  The clot was removed from the bladder with forceps and irrigated copiously.  No bladder lesions were noted.  Prior to performing the cystorrhaphy, I placed an 12 French Foley catheter through the left side of the lower abdomen, and performed a small cystotomy on the left dome of the bladder.  This was encircled with a 2-0 Vicryl suture in a pursestring fashion.  I then placed the Foley catheter through this cystotomy, and then tied the suture and brought it to  the posterior part of the rectus muscle, sutured it to the posterior rectus muscle such that the cystotomy would approximate the posterior bladder wall.  This was then tied.  The balloon was filled with 15 cc of water.  I then closed the cystotomy quite carefully with a running 2-0 Vicryl.  It was a tenuous  closure as this bladder wall was quite thin.  I was then able to perform a second running layer closure.  At this point, this was watertight although I did not place a lot of pressure with the saline put through the cystostomy tube.  The reservoir space was then again irrigated copiously, including the exposed tubing which was a small amount.  I then passed a 15 Blake drain through the right lower abdominal wall and curled the drain within the reservoir space.  It was sutured to the body wall with 2-0 nylon.  The cystostomy tube was sutured using 2-0 silk.  At this point, the abdomen was irrigated with saline.  There was no evident abnormality of the bowels which were visible.  The sponge counts were correct at this point.  Rectus fascia was reapproximated using a running 0 PDS.  At this point the wound was again irrigated, and subcutaneous tissue reapproximated using a running 2-0 Vicryl.  Skin edges were then reapproximated using 4-0 Monocryl placed in a running subcuticular fashion.  Dermabond was placed on the abdominal incision.  Fluff drains were placed over top of this and around the penis and scrotum.  At this point the procedure was terminated.  Sponge, needle and instrument counts were correct x2.  The patient was then awakened, extubated and taken to the PACU in stable condition, having tolerated the procedure well.

## 2020-11-16 MED ORDER — FLUCONAZOLE 100 MG PO TABS: 100.0000 mg | ORAL_TABLET | Freq: Every day | ORAL | 0 refills | Status: DC

## 2020-11-16 MED ORDER — HYDROCODONE-ACETAMINOPHEN 5-325 MG PO TABS: 1.0000 | ORAL_TABLET | ORAL | 0 refills | Status: DC | PRN

## 2020-11-16 MED ORDER — MAGNESIUM CITRATE PO SOLN
0.5000 | Freq: Once | ORAL | Status: AC
  Administered 2020-11-16: 0.5 via ORAL
  Filled 2020-11-16: qty 296

## 2020-11-16 MED ORDER — AMOXICILLIN-POT CLAVULANATE 500-125 MG PO TABS: 1.0000 | ORAL_TABLET | Freq: Two times a day (BID) | ORAL | 0 refills | Status: DC

## 2020-11-16 NOTE — Discharge Instructions (Signed)
1. It is okay to shower. Keep the areas around your drain clean and dry. Do not take a bath  2. Empty drain/measure drain output every 12 hours and keep a log of that  3. Report any fever or changes in your wound  4. It is okay to drive when you are no longer on pain medicine  5. Do not do any heavy lifting for 4 weeks  6. Dr. Diona Fanti will call you to set up appointment.

## 2020-11-16 NOTE — Plan of Care (Signed)

## 2020-11-21 DIAGNOSIS — T83490A Other mechanical complication of penile (implanted) prosthesis, initial encounter: Secondary | ICD-10-CM | POA: Diagnosis not present

## 2020-11-21 DIAGNOSIS — L700 Acne vulgaris: Secondary | ICD-10-CM | POA: Diagnosis not present

## 2020-11-23 DIAGNOSIS — S3729XS Other injury of bladder, sequela: Secondary | ICD-10-CM | POA: Diagnosis not present

## 2020-11-28 NOTE — Discharge Summary (Signed)
Patient ID: Kevin [name] MRN: [id number] DOB/AGE: Jan 07, 1956 65 y.o.  Admit date: 11/14/2020 Discharge date: 11/28/2020  Primary Care Physician:  Josetta Huddle, MD  Discharge Diagnoses:  Marland Kitchen Mechanical breakdown of implanted penile prosthesis, initial encounter [facility]) Present on Admission: . Mechanical breakdown of implanted penile prosthesis, initial encounter [facility])    Discharge Medications: Allergies as of 11/16/2020      Reactions   Lisinopril    Other reaction(s): cough, ED      Medication List    TAKE these medications   amLODipine-valsartan 5-160 MG tablet Commonly known as: EXFORGE Take 1 tablet by mouth daily.   amoxicillin-clavulanate 500-125 MG tablet Commonly known as: Augmentin Take 1 tablet (500 mg total) by mouth 2 (two) times daily.   citalopram 40 MG tablet Commonly known as: CELEXA Take 40 mg by mouth daily.   fluconazole 100 MG tablet Commonly known as: DIFLUCAN Take 1 tablet (100 mg total) by mouth daily. X 7 days   HYDROcodone-acetaminophen 5-325 MG tablet Commonly known as: NORCO/VICODIN Take 1 tablet by mouth every 4 (four) hours as needed for moderate pain.   milk thistle 175 MG tablet Take 175 mg by mouth in the morning and at bedtime.   peginterferon alfa-2a 180 MCG/ML injection Commonly known as: PEGASYS Inject 180 mcg into the skin every 7 (seven) days.   Psyllium 52.3 % Powd Take 1 each by mouth daily. metamucil   Red Yeast Rice Extract 600 MG Caps Take 1 capsule by mouth in the morning and at bedtime.   triamcinolone 0.1 % Commonly known as: KENALOG Apply topically See admin instructions. 2 weeks on 1 week off daily        Significant Diagnostic Studies:  No results found.  Brief H and P: For complete details please refer to admission H and P, but in brief pt admitted for explant of IPP reservoir eroded into bladder.  Hospital Course: On day of admission pt underwent urgent explant of eroded reservoir. Reservoir solely  removed, cystorrhaphy performed, sp tube placed. Pt kept 2 nights for abx/pain mgmt, d/ced on POD 2.  Day of Discharge BP (!) 134/97 (BP Location: Right Arm)   Pulse 66   Temp 98.2 F (36.8 C) (Oral)   Resp 15   Ht 6' (1.829 m)   Wt 92.8 kg   SpO2 98%   BMI 27.74 kg/m   No results found for this or any previous visit (from the past 24 hour(s)).  Physical Exam: General: Alert and awake oriented x3 not in any acute distress. HEENT: anicteric sclera, pupils reactive to light and accommodation CVS: S1-S2 clear no murmur rubs or gallops Chest: clear to auscultation bilaterally, no wheezing rales or rhonchi Abdomen: soft nontender, nondistended, normal bowel sounds, no organomegaly Extremities: no cyanosis, clubbing or edema noted bilaterally Neuro: Cranial nerves II-XII intact, no focal neurological deficits  Disposition:  Home  Diet:  Regular  Activity:  Discussed w/ opt   TESTS THAT NEED FOLLOW-UP   N/A  DISCHARGE FOLLOW-UP   Follow-up Information    Franchot Gallo, MD Follow up.   Specialty: Urology Why: We will call you to set up appt Contact information: [address] [place] [address] [phone]               Time spent on Discharge:   15 mins  Signed: Lillette Boxer Shere Eisenhart 11/28/2020, 8:00 PM

## 2020-11-30 DIAGNOSIS — T83490A Other mechanical complication of penile (implanted) prosthesis, initial encounter: Secondary | ICD-10-CM | POA: Diagnosis not present

## 2020-12-12 DIAGNOSIS — C61 Malignant neoplasm of prostate: Secondary | ICD-10-CM | POA: Diagnosis not present

## 2020-12-12 DIAGNOSIS — N5231 Erectile dysfunction following radical prostatectomy: Secondary | ICD-10-CM | POA: Diagnosis not present

## 2020-12-22 DIAGNOSIS — G4733 Obstructive sleep apnea (adult) (pediatric): Secondary | ICD-10-CM | POA: Diagnosis not present

## 2021-01-11 DIAGNOSIS — N529 Male erectile dysfunction, unspecified: Secondary | ICD-10-CM | POA: Diagnosis not present

## 2021-01-11 DIAGNOSIS — C61 Malignant neoplasm of prostate: Secondary | ICD-10-CM | POA: Diagnosis not present

## 2021-01-12 DIAGNOSIS — C61 Malignant neoplasm of prostate: Secondary | ICD-10-CM | POA: Diagnosis not present

## 2021-01-30 DIAGNOSIS — T83490A Other mechanical complication of penile (implanted) prosthesis, initial encounter: Secondary | ICD-10-CM | POA: Diagnosis not present

## 2021-01-30 DIAGNOSIS — R8271 Bacteriuria: Secondary | ICD-10-CM | POA: Diagnosis not present

## 2021-02-01 ENCOUNTER — Other Ambulatory Visit: Payer: Self-pay | Admitting: Urology

## 2021-02-15 DIAGNOSIS — C8400 Mycosis fungoides, unspecified site: Secondary | ICD-10-CM | POA: Diagnosis not present

## 2021-02-20 ENCOUNTER — Encounter (HOSPITAL_BASED_OUTPATIENT_CLINIC_OR_DEPARTMENT_OTHER): Payer: Self-pay | Admitting: Urology

## 2021-02-21 ENCOUNTER — Other Ambulatory Visit: Payer: Self-pay

## 2021-02-21 ENCOUNTER — Encounter (HOSPITAL_BASED_OUTPATIENT_CLINIC_OR_DEPARTMENT_OTHER): Payer: Self-pay | Admitting: Urology

## 2021-02-21 NOTE — Progress Notes (Signed)
Spoke w/ via phone for pre-op interview--- Pt Lab needs dos----  Istat             Lab results------ current ekg in epic/ chart COVID test -----patient states asymptomatic no test needed Arrive at ------- 0945 on 02-23-2021 NPO after MN NO Solid Food.  Clear liquids from MN until--- 0845 Med rec completed Medications to take morning of surgery ----- NONE Diabetic medication ----- n/a Patient instructed to bring photo id and insurance card day of surgery Patient aware to have Driver (ride ) / caregiver    for 24 hours after surgery -- wife, Kevin [name] Patient Special Instructions ----- n/a Pre-Op special Istructions ----- n/a Patient verbalized understanding of instructions that were given at this phone interview. Patient denies shortness of breath, chest pain, fever, cough at this phone interview.

## 2021-02-22 MED ORDER — VANCOMYCIN HCL 1 G IV SOLR
Freq: Once | INTRAVENOUS | Status: AC
  Administered 2021-02-23: 1000 mL
  Filled 2021-02-22: qty 1000

## 2021-02-22 NOTE — H&P (Signed)
H&P  Chief Complaint: Erectile dysfunction with history of bladder erosion from IPP reservoir, status post excision the reservoir and cystorrhaphy  History of Present Illness: 65 year old male presents at this time for replacement of his reservoir from his inflatable penile prosthesis.  He had implant in September 2021.  He had a relatively uncomplicated postoperative course.  However, evaluation in early 2022 revealed cystoscopic appearance of erosion of his reservoir through his anterior bladder wall.  He underwent urgent exploration, removal of reservoir, cystorrhaphy with eventual suprapubic tube placement.  He presents at this time, after 3 months of no evidence of infection with excellent healing, for ectopic placement of reservoir.  Past Medical History:  Diagnosis Date  . Erectile dysfunction   . Hypertension   . OSA on CPAP    does not know settings  . 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 Prostate cancer ([place]) 2017  . Rosacea     Past Surgical History:  Procedure Laterality Date  . CHOLECYSTECTOMY  1994 [id number]  . CYSTOSCOPY W/ RETROGRADES Left 01/20/2017   Procedure: CYSTOSCOPY WITH RETROGRADE PYELOGRAM, LEFT STENT PLACEMENT(STRING OFF);  Surgeon: Raynelle Bring, MD;  Location: [facility];  Service: Urology;  Laterality: Left;  . CYSTOSCOPY WITH RETROGRADE PYELOGRAM, URETEROSCOPY AND STENT PLACEMENT Left 01/17/2017   Procedure: CYSTOSCOPY WITH RETROGRADE PYELOGRAM, URETEROSCOPY, HOLMIUM LASER AND  LEFT STENT PLACEMENT;  Surgeon: Franchot Gallo, MD;  Location: [facility];  Service: Urology;  Laterality: Left;  . PENILE PROSTHESIS IMPLANT N/A 06/23/2020   Procedure: PENILE PROTHESIS INFLATABLE;  Surgeon: Franchot Gallo, MD;  Location: [facility];  Service: Urology;  Laterality: N/A;  2 HRS  . PENILE PROSTHESIS IMPLANT N/A 11/14/2020   Procedure: Excision of prosthesis  resevoir, Closure of bladder defect, Suprapubic tube placement;  Surgeon: Franchot Gallo, MD;  Location: [facility];  Service: Urology;  Laterality: N/A;  . PROSTATE SURGERY  2017  . receiving chronic interferon     . TONSILLECTOMY  as child   adenoids removed  . URETERAL STENT PLACEMENT    . wisdom  teeth extraction  1990    Home Medications:  Allergies as of 02/22/2021      Reactions   Lisinopril    Other reaction(s): cough, ED      Medication List    Notice   Cannot display discharge medications because the patient has not yet been admitted.     Allergies:  Allergies  Allergen Reactions  . Lisinopril     Other reaction(s): cough, ED    Family History  Problem Relation Age of Onset  . Stroke Father   . Hypertension Father   . Head & neck cancer Father   . Hypertension Mother   . Skin cancer Mother   . Prostate cancer Paternal Uncle        prostate ca  . Colon cancer Maternal Grandmother   . Breast cancer Neg Hx     Social History:  reports that he quit smoking about 6 years ago. His smoking use included cigarettes. He has a 5.00 pack-year smoking history. He has never used smokeless tobacco. He reports current alcohol use. He reports current drug use. Drug: Marijuana.  ROS: A complete review of systems was performed.  All systems are negative except for pertinent findings as noted.  Physical Exam:  Vital signs in last 24 hours: Ht 6' (1.829 m)   Wt 93 kg   BMI  27.80 kg/m  Constitutional:  Alert and oriented, No acute distress Cardiovascular: Regular rate  Respiratory: Normal respiratory effort GI: Abdomen is soft, nontender, nondistended, no abdominal masses. No CVAT.  Genitourinary: Normal male phallus, testes are descended bilaterally and non-tender and without masses, scrotum is normal in appearance without lesions or masses, perineum is normal on inspection. Lymphatic: No lymphadenopathy Neurologic: Grossly intact, no focal deficits Psychiatric:  Normal mood and affect  Laboratory Data:  No results for input(s): WBC, HGB, HCT, PLT in the last 72 hours.  No results for input(s): NA, K, CL, GLUCOSE, BUN, CALCIUM, CREATININE in the last 72 hours.  Invalid input(s): CO3   No results found for this or any previous visit (from the past 24 hour(s)). No results found for this or any previous visit (from the past 240 hour(s)).  Renal Function: No results for input(s): CREATININE in the last 168 hours. CrCl cannot be calculated (Patient's most recent lab result is older than the maximum 21 days allowed.).  Radiologic Imaging: No results found.  Impression/Assessment:  Status post IPP placement with malfunction/erosion of reservoir, for replacement  Plan:  I have spoken with the patient about proceeding with replacement of his reservoir.  This will be done in an ectopic, subcutaneous pouch.

## 2021-02-23 ENCOUNTER — Ambulatory Visit (HOSPITAL_BASED_OUTPATIENT_CLINIC_OR_DEPARTMENT_OTHER): Payer: BC Managed Care – PPO | Admitting: Anesthesiology

## 2021-02-23 ENCOUNTER — Ambulatory Visit (HOSPITAL_BASED_OUTPATIENT_CLINIC_OR_DEPARTMENT_OTHER)
Admission: RE | Admit: 2021-02-23 | Discharge: 2021-02-23 | Disposition: A | Discharge status: Home / Self Care | Payer: BC Managed Care – PPO | Attending: Urology | Admitting: Urology

## 2021-02-23 ENCOUNTER — Encounter (HOSPITAL_BASED_OUTPATIENT_CLINIC_OR_DEPARTMENT_OTHER): Payer: Self-pay | Admitting: Urology

## 2021-02-23 ENCOUNTER — Encounter (HOSPITAL_BASED_OUTPATIENT_CLINIC_OR_DEPARTMENT_OTHER)
Admission: RE | Disposition: A | Discharge status: Home / Self Care | Payer: Self-pay | Source: Home / Self Care | Attending: Urology

## 2021-02-23 DIAGNOSIS — T83410A Breakdown (mechanical) of penile (implanted) prosthesis, initial encounter: Secondary | ICD-10-CM | POA: Diagnosis not present

## 2021-02-23 DIAGNOSIS — Z9889 Other specified postprocedural states: Secondary | ICD-10-CM | POA: Diagnosis not present

## 2021-02-23 DIAGNOSIS — I10 Essential (primary) hypertension: Secondary | ICD-10-CM | POA: Diagnosis not present

## 2021-02-23 DIAGNOSIS — N529 Male erectile dysfunction, unspecified: Secondary | ICD-10-CM | POA: Diagnosis not present

## 2021-02-23 DIAGNOSIS — T83719A Erosion of other prosthetic materials to surrounding organ or tissue, initial encounter: Secondary | ICD-10-CM | POA: Diagnosis not present

## 2021-02-23 DIAGNOSIS — Z87891 Personal history of nicotine dependence: Secondary | ICD-10-CM | POA: Insufficient documentation

## 2021-02-23 DIAGNOSIS — Z888 Allergy status to other drugs, medicaments and biological substances status: Secondary | ICD-10-CM | POA: Diagnosis not present

## 2021-02-23 DIAGNOSIS — N5231 Erectile dysfunction following radical prostatectomy: Secondary | ICD-10-CM | POA: Diagnosis not present

## 2021-02-23 DIAGNOSIS — Z4589 Encounter for adjustment and management of other implanted devices: Secondary | ICD-10-CM | POA: Diagnosis not present

## 2021-02-23 DIAGNOSIS — N5201 Erectile dysfunction due to arterial insufficiency: Secondary | ICD-10-CM | POA: Diagnosis not present

## 2021-02-23 DIAGNOSIS — N201 Calculus of ureter: Secondary | ICD-10-CM | POA: Diagnosis not present

## 2021-02-23 HISTORY — DX: Obstructive sleep apnea (adult) (pediatric): G47.33

## 2021-02-23 HISTORY — PX: PENILE PROSTHESIS IMPLANT: SHX240

## 2021-02-23 HISTORY — DX: Obstructive sleep apnea (adult) (pediatric): Z99.89

## 2021-02-23 LAB — POCT I-STAT, CHEM 8
BUN: 15 mg/dL (ref 8–23)
Calcium, Ion: 1.32 mmol/L (ref 1.15–1.40)
Chloride: 107 mmol/L (ref 98–111)
Creatinine, Ser: 0.9 mg/dL (ref 0.61–1.24)
Glucose, Bld: 110 mg/dL — ABNORMAL HIGH (ref 70–99)
HCT: 41 % (ref 39.0–52.0)
Hemoglobin: 13.9 g/dL (ref 13.0–17.0)
Potassium: 4 mmol/L (ref 3.5–5.1)
Sodium: 140 mmol/L (ref 135–145)
TCO2: 21 mmol/L — ABNORMAL LOW (ref 22–32)

## 2021-02-23 SURGERY — INSERTION, PENILE PROSTHESIS, INFLATABLE
Anesthesia: General | Site: Abdomen

## 2021-02-23 MED ORDER — EPHEDRINE 5 MG/ML INJ
INTRAVENOUS | Status: AC
  Filled 2021-02-23: qty 10

## 2021-02-23 MED ORDER — DEXAMETHASONE SODIUM PHOSPHATE 4 MG/ML IJ SOLN
INTRAMUSCULAR | Status: DC | PRN
  Administered 2021-02-23: 5 mg via INTRAVENOUS

## 2021-02-23 MED ORDER — CEFAZOLIN SODIUM-DEXTROSE 2-4 GM/100ML-% IV SOLN
2.0000 g | INTRAVENOUS | Status: AC
  Administered 2021-02-23: 2 g via INTRAVENOUS

## 2021-02-23 MED ORDER — FENTANYL CITRATE (PF) 100 MCG/2ML IJ SOLN
INTRAMUSCULAR | Status: AC
  Filled 2021-02-23: qty 2

## 2021-02-23 MED ORDER — PROPOFOL 10 MG/ML IV BOLUS
INTRAVENOUS | Status: AC
  Filled 2021-02-23: qty 20

## 2021-02-23 MED ORDER — FENTANYL CITRATE (PF) 100 MCG/2ML IJ SOLN
25.0000 ug | INTRAMUSCULAR | Status: DC | PRN
  Administered 2021-02-23 (×4): 25 ug via INTRAVENOUS

## 2021-02-23 MED ORDER — EPHEDRINE SULFATE 50 MG/ML IJ SOLN
INTRAMUSCULAR | Status: DC | PRN
  Administered 2021-02-23: 10 mg via INTRAVENOUS

## 2021-02-23 MED ORDER — GENTAMICIN SULFATE 40 MG/ML IJ SOLN
5.0000 mg/kg | INTRAVENOUS | Status: AC
  Administered 2021-02-23: 465.2 mg via INTRAVENOUS
  Filled 2021-02-23: qty 11.75

## 2021-02-23 MED ORDER — ONDANSETRON HCL 4 MG/2ML IJ SOLN: 4.0000 mg | Freq: Once | INTRAMUSCULAR | Status: DC | PRN

## 2021-02-23 MED ORDER — DEXAMETHASONE SODIUM PHOSPHATE 10 MG/ML IJ SOLN
INTRAMUSCULAR | Status: AC
  Filled 2021-02-23: qty 1

## 2021-02-23 MED ORDER — OXYCODONE HCL 5 MG PO TABS
ORAL_TABLET | ORAL | Status: AC
  Filled 2021-02-23: qty 1

## 2021-02-23 MED ORDER — PROPOFOL 10 MG/ML IV BOLUS
INTRAVENOUS | Status: DC | PRN
  Administered 2021-02-23: 50 mg via INTRAVENOUS
  Administered 2021-02-23: 200 mg via INTRAVENOUS

## 2021-02-23 MED ORDER — CEFAZOLIN SODIUM-DEXTROSE 2-4 GM/100ML-% IV SOLN
INTRAVENOUS | Status: AC
  Filled 2021-02-23: qty 100

## 2021-02-23 MED ORDER — ONDANSETRON HCL 4 MG/2ML IJ SOLN
INTRAMUSCULAR | Status: AC
  Filled 2021-02-23: qty 2

## 2021-02-23 MED ORDER — OXYCODONE HCL 5 MG PO TABS
5.0000 mg | ORAL_TABLET | Freq: Once | ORAL | Status: AC | PRN
  Administered 2021-02-23: 5 mg via ORAL

## 2021-02-23 MED ORDER — OXYCODONE HCL 5 MG/5ML PO SOLN: 5.0000 mg | Freq: Once | ORAL | Status: AC | PRN

## 2021-02-23 MED ORDER — ONDANSETRON HCL 4 MG/2ML IJ SOLN
INTRAMUSCULAR | Status: DC | PRN
  Administered 2021-02-23: 4 mg via INTRAVENOUS

## 2021-02-23 MED ORDER — LACTATED RINGERS IV SOLN: INTRAVENOUS | Status: DC

## 2021-02-23 MED ORDER — CIPROFLOXACIN HCL 500 MG PO TABS: 500.0000 mg | ORAL_TABLET | Freq: Two times a day (BID) | ORAL | 0 refills | Status: AC

## 2021-02-23 MED ORDER — SODIUM CHLORIDE 0.9 % IV SOLN
INTRAVENOUS | Status: AC | PRN
  Administered 2021-02-23: 60 mL

## 2021-02-23 MED ORDER — LIDOCAINE 2% (20 MG/ML) 5 ML SYRINGE
INTRAMUSCULAR | Status: AC
  Filled 2021-02-23: qty 5

## 2021-02-23 MED ORDER — KETOROLAC TROMETHAMINE 30 MG/ML IJ SOLN
INTRAMUSCULAR | Status: DC | PRN
  Administered 2021-02-23: 30 mg via INTRAVENOUS

## 2021-02-23 MED ORDER — AMISULPRIDE (ANTIEMETIC) 5 MG/2ML IV SOLN: 10.0000 mg | Freq: Once | INTRAVENOUS | Status: DC | PRN

## 2021-02-23 MED ORDER — KETOROLAC TROMETHAMINE 30 MG/ML IJ SOLN
INTRAMUSCULAR | Status: AC
  Filled 2021-02-23: qty 1

## 2021-02-23 MED ORDER — FENTANYL CITRATE (PF) 100 MCG/2ML IJ SOLN
INTRAMUSCULAR | Status: DC | PRN
  Administered 2021-02-23 (×6): 25 ug via INTRAVENOUS
  Administered 2021-02-23: 50 ug via INTRAVENOUS

## 2021-02-23 MED ORDER — OXYCODONE HCL 5 MG PO TABS: 5.0000 mg | ORAL_TABLET | Freq: Three times a day (TID) | ORAL | 0 refills | Status: AC | PRN

## 2021-02-23 MED ORDER — LIDOCAINE HCL (CARDIAC) PF 100 MG/5ML IV SOSY
PREFILLED_SYRINGE | INTRAVENOUS | Status: DC | PRN
  Administered 2021-02-23: 60 mg via INTRAVENOUS

## 2021-02-23 MED ORDER — MIDAZOLAM HCL 5 MG/5ML IJ SOLN
INTRAMUSCULAR | Status: DC | PRN
  Administered 2021-02-23: 2 mg via INTRAVENOUS

## 2021-02-23 MED ORDER — MIDAZOLAM HCL 2 MG/2ML IJ SOLN
INTRAMUSCULAR | Status: AC
  Filled 2021-02-23: qty 2

## 2021-02-23 SURGICAL SUPPLY — 66 items
APPLICATOR COTTON TIP 6 STRL (MISCELLANEOUS) IMPLANT
APPLICATOR COTTON TIP 6IN STRL (MISCELLANEOUS)
BAG DECANTER FOR FLEXI CONT (MISCELLANEOUS) ×4 IMPLANT
BAG URINE DRAIN 2000ML AR STRL (UROLOGICAL SUPPLIES) IMPLANT
BENZOIN TINCTURE PRP APPL 2/3 (GAUZE/BANDAGES/DRESSINGS) IMPLANT
BLADE CLIPPER SENSICLIP SURGIC (BLADE) ×2 IMPLANT
BLADE HEX COATED 2.75 (ELECTRODE) ×2 IMPLANT
BLADE SURG 15 STRL LF DISP TIS (BLADE) ×1 IMPLANT
BLADE SURG 15 STRL SS (BLADE) ×2
BNDG COHESIVE 2X5 TAN STRL LF (GAUZE/BANDAGES/DRESSINGS) IMPLANT
BNDG GAUZE ELAST 4 BULKY (GAUZE/BANDAGES/DRESSINGS) ×2 IMPLANT
CANISTER SUCT 1200ML W/VALVE (MISCELLANEOUS) IMPLANT
CATH FOLEY 2WAY SLVR  5CC 16FR (CATHETERS)
CATH FOLEY 2WAY SLVR 5CC 16FR (CATHETERS) IMPLANT
CHLORAPREP W/TINT 26 (MISCELLANEOUS) ×2 IMPLANT
COVER BACK TABLE 60X90IN (DRAPES) ×2 IMPLANT
COVER MAYO STAND STRL (DRAPES) ×4 IMPLANT
COVER WAND RF STERILE (DRAPES) ×2 IMPLANT
DERMABOND ADVANCED (GAUZE/BANDAGES/DRESSINGS) ×1
DERMABOND ADVANCED .7 DNX12 (GAUZE/BANDAGES/DRESSINGS) ×1 IMPLANT
DISSECTOR ROUND CHERRY 3/8 STR (MISCELLANEOUS) IMPLANT
DRAPE INCISE IOBAN 66X45 STRL (DRAPES) ×2 IMPLANT
DRAPE LAPAROTOMY TRNSV 102X78 (DRAPES) ×2 IMPLANT
DRSG TEGADERM 4X4.75 (GAUZE/BANDAGES/DRESSINGS) IMPLANT
DRSG TELFA 3X8 NADH (GAUZE/BANDAGES/DRESSINGS) IMPLANT
ELECT REM PT RETURN 9FT ADLT (ELECTROSURGICAL) ×2
ELECTRODE REM PT RTRN 9FT ADLT (ELECTROSURGICAL) ×1 IMPLANT
GLOVE SURG ENC MOIS LTX SZ7 (GLOVE) ×2 IMPLANT
GLOVE SURG ENC MOIS LTX SZ8 (GLOVE) ×4 IMPLANT
GLOVE SURG UNDER POLY LF SZ7 (GLOVE) ×6 IMPLANT
GOWN STRL REUS W/TWL LRG LVL3 (GOWN DISPOSABLE) ×2 IMPLANT
GOWN STRL REUS W/TWL XL LVL3 (GOWN DISPOSABLE) ×2 IMPLANT
HOLDER FOLEY CATH W/STRAP (MISCELLANEOUS) IMPLANT
KIT TITAN ASSEMBLY (Erectile Restoration) ×2 IMPLANT
KIT TITAN ASSEMBLY STANDARD (Erectile Restoration) ×1 IMPLANT
KIT TITAN ASSEMBLY STD (Erectile Restoration) ×1 IMPLANT
KIT TURNOVER CYSTO (KITS) ×2 IMPLANT
MANIFOLD NEPTUNE II (INSTRUMENTS) ×2 IMPLANT
NS IRRIG 500ML POUR BTL (IV SOLUTION) IMPLANT
PACK BASIN DAY SURGERY FS (CUSTOM PROCEDURE TRAY) ×2 IMPLANT
PENCIL SMOKE EVACUATOR (MISCELLANEOUS) ×2 IMPLANT
PLUG CATH AND CAP STER (CATHETERS) ×2 IMPLANT
RESERVOIR 75CC LOCKOUT BIOFLEX (Erectile Restoration) ×2 IMPLANT
RETRACTOR STAY HOOK 5MM (MISCELLANEOUS) IMPLANT
RETRACTOR STERILE 25.8CMX11.3 (INSTRUMENTS) IMPLANT
RETRACTOR WILSON SYSTEM (INSTRUMENTS) IMPLANT
SPONGE LAP 4X18 RFD (DISPOSABLE) ×2 IMPLANT
STRIP CLOSURE SKIN 1/2X4 (GAUZE/BANDAGES/DRESSINGS) IMPLANT
SUPPORT SCROTAL LG STRP (MISCELLANEOUS) IMPLANT
SURGILUBE 2OZ TUBE FLIPTOP (MISCELLANEOUS) IMPLANT
SUT MNCRL AB 4-0 PS2 18 (SUTURE) ×2 IMPLANT
SUT PROLENE 0 CT 2 (SUTURE) ×2 IMPLANT
SUT VIC AB 2-0 SH 27 (SUTURE)
SUT VIC AB 2-0 SH 27XBRD (SUTURE) IMPLANT
SUT VIC AB 2-0 UR5 27 (SUTURE) ×4 IMPLANT
SUT VIC AB 5-0 PS2 18 (SUTURE) IMPLANT
SUT VICRYL 0 27 CT2 27 ABS (SUTURE) ×2 IMPLANT
SYR 10ML LL (SYRINGE) ×2 IMPLANT
SYR 20ML LL LF (SYRINGE) ×2 IMPLANT
SYR 50ML LL SCALE MARK (SYRINGE) ×4 IMPLANT
SYR BULB IRRIG 60ML STRL (SYRINGE) ×2 IMPLANT
TOWEL OR 17X26 10 PK STRL BLUE (TOWEL DISPOSABLE) ×2 IMPLANT
TRAY DSU PREP LF (CUSTOM PROCEDURE TRAY) ×2 IMPLANT
TUBE CONNECTING 12X1/4 (SUCTIONS) ×2 IMPLANT
WATER STERILE IRR 500ML POUR (IV SOLUTION) IMPLANT
YANKAUER SUCT BULB TIP NO VENT (SUCTIONS) ×2 IMPLANT

## 2021-02-23 NOTE — Anesthesia Postprocedure Evaluation (Signed)
Anesthesia Post Note  Patient: Kevin [name]  Procedure(s) Performed: PENILE PROTHESIS RESERVOIR (N/A Abdomen)     Patient location during evaluation: PACU Anesthesia Type: General Level of consciousness: awake and alert Pain management: pain level controlled Vital Signs Assessment: post-procedure vital signs reviewed and stable Respiratory status: spontaneous breathing, nonlabored ventilation and respiratory function stable Cardiovascular status: blood pressure returned to baseline and stable Postop Assessment: no apparent nausea or vomiting Anesthetic complications: no   No complications documented.  Last Vitals:  Vitals:   02/23/21 1410 02/23/21 1415  BP:  (!) 148/104  Pulse:  74  Resp:  13  Temp:    SpO2: 95% 94%    Last Pain:  Vitals:   02/23/21 1436  TempSrc:   PainSc: [place]

## 2021-02-23 NOTE — Anesthesia Preprocedure Evaluation (Addendum)
Anesthesia Evaluation  Patient identified by MRN, date of birth, ID band Patient awake    Reviewed: Allergy & Precautions, NPO status , Patient's Chart, lab work & pertinent test results  History of Anesthesia Complications Negative for: history of anesthetic complications  Airway Mallampati: II  TM Distance: >3 FB Neck ROM: Full    Dental  (+) Dental Advisory Given, Teeth Intact   Pulmonary sleep apnea and Continuous Positive Airway Pressure Ventilation , former smoker,    Pulmonary exam normal        Cardiovascular hypertension, Pt. on medications Normal cardiovascular exam     Neuro/Psych negative neurological ROS     GI/Hepatic negative GI ROS, Neg liver ROS,   Endo/Other  negative endocrine ROS  Renal/GU negative Renal ROS   H/o prostate cancer ED    Musculoskeletal negative musculoskeletal ROS (+)   Abdominal   Peds  Hematology negative hematology ROS (+)   Anesthesia Other Findings   Reproductive/Obstetrics                            Anesthesia Physical Anesthesia Plan  ASA: II  Anesthesia Plan: General   Post-op Pain Management:    Induction: Intravenous  PONV Risk Score and Plan: 2 and Ondansetron, Dexamethasone, Midazolam and Treatment may vary due to age or medical condition  Airway Management Planned: LMA  Additional Equipment: None  Intra-op Plan:   Post-operative Plan: Extubation in OR  Informed Consent: I have reviewed the patients History and Physical, chart, labs and discussed the procedure including the risks, benefits and alternatives for the proposed anesthesia with the patient or authorized representative who has indicated his/her understanding and acceptance.     Dental advisory given  Plan Discussed with:   Anesthesia Plan Comments:         Anesthesia Quick Evaluation

## 2021-02-23 NOTE — Discharge Instructions (Signed)
Post Anesthesia Home Care Instructions  Activity: Get plenty of rest for the remainder of the day. A responsible individual must stay with you for 24 hours following the procedure.  For the next 24 hours, DO NOT: -Drive a car -Paediatric nurse -Drink alcoholic beverages -Take any medication unless instructed by your physician -Make any legal decisions or sign important papers.  Meals: Start with liquid foods such as gelatin or soup. Progress to regular foods as tolerated. Avoid greasy, spicy, heavy foods. If nausea and/or vomiting occur, drink only clear liquids until the nausea and/or vomiting subsides. Call your physician if vomiting continues.  Special Instructions/Symptoms: Your throat may feel dry or sore from the anesthesia or the breathing tube placed in your throat during surgery. If this causes discomfort, gargle with warm salt water. The discomfort should disappear within 24 hours.  If you had a scopolamine patch placed behind your ear for the management of post- operative nausea and/or vomiting:  1. The medication in the patch is effective for 72 hours, after which it should be removed.  Wrap patch in a tissue and discard in the trash. Wash hands thoroughly with soap and water. 2. You may remove the patch earlier than 72 hours if you experience unpleasant side effects which may include dry mouth, dizziness or visual disturbances. 3. Avoid touching the patch. Wash your hands with soap and water after contact with the patch.  Wound Care, Adult Taking care of your wound properly can help to prevent pain, infection, and scarring. It can also help your wound heal more quickly. Follow instructions from your health care provider about how to care for your wound. Supplies needed:  Soap and water.  Wound cleanser.  Gauze.  If needed, a clean bandage (dressing) or other type of wound dressing material to cover or place in the wound. Follow your health care provider's instructions  about what dressing supplies to use.  Cream or ointment to apply to the wound, if told by your health care provider. How to care for your wound Cleaning the wound Ask your health care provider how to clean the wound. This may include:  Using mild soap and water or a wound cleanser.  Using a clean gauze to pat the wound dry after cleaning it. Do not rub or scrub the wound. Dressing care  Wash your hands with soap and water for at least 20 seconds before and after you change the dressing. If soap and water are not available, use hand sanitizer.  Change your dressing as told by your health care provider. This may include: ? Cleaning or rinsing out (irrigating) the wound. ? Placing a dressing over the wound or in the wound (packing). ? Covering the wound with an outer dressing.  Leave any stitches (sutures), skin glue, or adhesive strips in place. These skin closures may need to stay in place for 2 weeks or longer. If adhesive strip edges start to loosen and curl up, you may trim the loose edges. Do not remove adhesive strips completely unless your health care provider tells you to do that.  Ask your health care provider when you can leave the wound uncovered. Checking for infection Check your wound area every day for signs of infection. Check for:  More redness, swelling, or pain.  Fluid or blood.  Warmth.  Pus or a bad smell.   Follow these instructions at home Medicines  If you were prescribed an antibiotic medicine, cream, or ointment, take or apply it as told by  your health care provider. Do not stop using the antibiotic even if your condition improves.  If you were prescribed pain medicine, take it 30 minutes before you do any wound care or as told by your health care provider.  Take over-the-counter and prescription medicines only as told by your health care provider. Eating and drinking  Eat a diet that includes protein, vitamin A, vitamin C, and other nutrient-rich  foods to help the wound heal. ? Foods rich in protein include meat, fish, eggs, dairy, beans, and nuts. ? Foods rich in vitamin A include carrots and dark green, leafy vegetables. ? Foods rich in vitamin C include citrus fruits, tomatoes, broccoli, and peppers.  Drink enough fluid to keep your urine pale yellow. General instructions  Do not take baths, swim, use a hot tub, or do anything that would put the wound underwater until your health care provider approves. Ask your health care provider if you may take showers. You may only be allowed to take sponge baths.  Do not scratch or pick at the wound. Keep it covered as told by your health care provider.  Return to your normal activities as told by your health care provider. Ask your health care provider what activities are safe for you.  Protect your wound from the sun when you are outside for the first 6 months, or for as long as told by your health care provider. Cover up the scar area or apply sunscreen that has an SPF of at least 2.  Do not use any products that contain nicotine or tobacco, such as cigarettes, e-cigarettes, and chewing tobacco. These may delay wound healing. If you need help quitting, ask your health care provider.  Keep all follow-up visits as told by your health care provider. This is important. Contact a health care provider if:  You received a tetanus shot and you have swelling, severe pain, redness, or bleeding at the injection site.  Your pain is not controlled with medicine.  You have any of these signs of infection: ? More redness, swelling, or pain around the wound. ? Fluid or blood coming from the wound. ? Warmth coming from the wound. ? Pus or a bad smell coming from the wound. ? A fever or chills.  You are nauseous or you vomit.  You are dizzy. Get help right away if:  You have a red streak of skin near the area around your wound.  Your wound has been closed with staples, sutures, skin glue, or  adhesive strips and it begins to open up and separate.  Your wound is bleeding, and the bleeding does not stop with gentle pressure.  You have a rash.  You faint.  You have trouble breathing. These symptoms may represent a serious problem that is an emergency. Do not wait to see if the symptoms will go away. Get medical help right away. Call your local emergency services (911 in the [place] Do not drive yourself to the hospital. Summary  Always wash your hands with soap and water for at least 20 seconds before and after changing your dressing.  Change your dressing as told by your health care provider.  To help with healing, eat foods that are rich in protein, vitamin A, vitamin C, and other nutrients.  Check your wound every day for signs of infection. Contact your health care provider if you suspect that your wound is infected. This information is not intended to replace advice given to you by your health care provider.  Make sure you discuss any questions you have with your health care provider. Document Revised: 07/10/2019 Document Reviewed: 07/10/2019 Elsevier Patient Education  2021 [employer].  1.  Take it easy for the next week.  2.  Use scrotal support if that feels good  3.  Okay to shower starting on Friday morning  4.  Take antibiotics as prescribed

## 2021-02-23 NOTE — Transfer of Care (Signed)
Immediate Anesthesia Transfer of Care Note  Patient: Kevin [name]  Procedure(s) Performed: Procedure(s) (LRB): PENILE PROTHESIS RESERVOIR (N/A)  Patient Location: PACU  Anesthesia Type: General  Level of Consciousness: awake, sedated, patient cooperative and responds to stimulation  Airway & Oxygen Therapy: Patient Spontanous Breathing and Patient connected to [place] 02 and soft FM   Post-op Assessment: Report given to PACU RN, Post -op Vital signs reviewed and stable and Patient moving all extremities  Post vital signs: Reviewed and stable  Complications: No apparent anesthesia complications

## 2021-02-23 NOTE — Anesthesia Procedure Notes (Signed)
Procedure Name: LMA Insertion Date/Time: 02/23/2021 11:59 AM Performed by: Justice Rocher, CRNA Pre-anesthesia Checklist: Patient identified, Emergency Drugs available, Suction available, Patient being monitored and Timeout performed Patient Re-evaluated:Patient Re-evaluated prior to induction Oxygen Delivery Method: Circle system utilized Preoxygenation: Pre-oxygenation with 100% oxygen Induction Type: IV induction Ventilation: Mask ventilation without difficulty LMA: LMA inserted LMA Size: 5.0 Number of attempts: 1 Airway Equipment and Method: Bite block Placement Confirmation: positive ETCO2,  CO2 detector and breath sounds checked- equal and bilateral Tube secured with: Tape Dental Injury: Teeth and Oropharynx as per pre-operative assessment

## 2021-02-23 NOTE — Op Note (Signed)
Preoperative diagnosis: History of malfunction/erosion of IPP reservoir, status post explant  Postoperative diagnosis: Same  Principal procedure: Placement of ectopic IPP reservoir  Surgeon: Gisella Alwine  Anesthesia: General with LMA  Complications: None  Specimen: Fluid from IPP pump for culture  Estimated blood loss: Less than 50 mL  Drains: None  Indications: 65 year old male status post IPP placement, [employer], and September 2021.  He had erosion of his reservoir into his bladder noted earlier this year-in February.  He underwent urgent explant of his eroded reservoir as well as cystorrhaphy, suprapubic tube placement at that time.  His pump tubing from the reservoir had been clamped off.  He has had 3 months of healing, and has had no evidence of infection.  He has had regular visits in the office.  He presents at this time for revision/replacement of his pump reservoir in an ectopic position.  I discussed the procedure with Shanon Brow, the possibility of needing to replace his whole 3-piece component, as well as the risk of infection.  He understands these and desires to proceed.  Description of procedure: Patient was properly identified in the holding area.  He received preoperative IV antibiotics.  He was taken to the operating room where general anesthetic was administered with the LMA.  He remained in the supine position.  His genitalia and lower abdomen were scrubbed for 5 minutes with Betadine, then ChloraPrep was placed.  He was then draped.  Timeout was performed.  His old scar in the infrapubic/suprapubic region was excised.  Dissection was carried down through subcutaneous tissue with electrocautery.  Dissection was carried down to identify first the cylinder tubing and then the reservoir tubing.  Once the reservoir tubing was identified, the pump and was confirmed as well as the reservoir and.  I tried dissecting towards the tied off reservoir and, but I never could identify  this as it was adhesed.  I did dissected as far as possible in the deep wound.  Once this was freed up, both proximal and distal, rubber-shod clamps were placed.  The tubing was cut.  A syringe was placed on the pump and, and approximately 10 to 15 cc of fluid was aspirated.  This was slightly cloudy and I sent it for culture although I was not strongly suspicious that this was infectious.  I then created subcutaneous pouch in the right lower abdomen, over top of the rectus muscle.  This was just large enough for the 75 cc [place] reservoir.  I then placed the reservoir quite easily in the space, and 60 cc of saline was introduced.  This sat flat, and was almost nonpalpable through the skin anteriorly.  The medial edge of this was then closed with 2-0 Vicryl suture to prevent migration.  The tubing from the reservoir was trimmed to size, and a connection between the reservoir and the pump was made using the connectors.  Following adequate connection, the pump was easily used to inflate the cylinders, deflate, and I cycled this 3 times to make sure that this worked appropriately.  There was an adequate erection.  Following this, the reservoir pouch (this was irrigated well even before we closed the pouch) and the wound were irrigated copiously with antibiotic irrigation.  There was adequate hemostasis.  Subcutaneous tissue was closed in 2 layers using running 2-0 Vicryl's.  Skin edges were approximated using 4-0 Monocryl.  Dermabond was then placed.  The pump was left deflated.  At this point, the procedure was terminated.  He was  awakened and taken to the PACU in stable condition.  He tolerated the procedure well.  Estimated blood loss less than 50 mL.  Sponge needle and instrument counts were correct x2.

## 2021-02-28 ENCOUNTER — Encounter (HOSPITAL_BASED_OUTPATIENT_CLINIC_OR_DEPARTMENT_OTHER): Payer: Self-pay | Admitting: Urology

## 2021-02-28 LAB — AEROBIC/ANAEROBIC CULTURE W GRAM STAIN (SURGICAL/DEEP WOUND)
Culture: NO GROWTH
Gram Stain: NONE SEEN

## 2021-03-16 DIAGNOSIS — T83490A Other mechanical complication of penile (implanted) prosthesis, initial encounter: Secondary | ICD-10-CM | POA: Diagnosis not present

## 2021-03-16 DIAGNOSIS — C61 Malignant neoplasm of prostate: Secondary | ICD-10-CM | POA: Diagnosis not present

## 2021-03-23 DIAGNOSIS — G4733 Obstructive sleep apnea (adult) (pediatric): Secondary | ICD-10-CM | POA: Diagnosis not present

## 2021-03-24 LAB — FUNGUS CULTURE WITH STAIN

## 2021-03-24 LAB — FUNGUS CULTURE RESULT

## 2021-03-24 LAB — FUNGAL ORGANISM REFLEX

## 2021-04-04 DIAGNOSIS — R102 Pelvic and perineal pain: Secondary | ICD-10-CM | POA: Diagnosis not present

## 2021-04-25 DIAGNOSIS — F1290 Cannabis use, unspecified, uncomplicated: Secondary | ICD-10-CM | POA: Diagnosis not present

## 2021-04-25 DIAGNOSIS — Z923 Personal history of irradiation: Secondary | ICD-10-CM | POA: Diagnosis not present

## 2021-04-25 DIAGNOSIS — Y831 Surgical operation with implant of artificial internal device as the cause of abnormal reaction of the patient, or of later complication, without mention of misadventure at the time of the procedure: Secondary | ICD-10-CM | POA: Diagnosis not present

## 2021-04-25 DIAGNOSIS — T8361XA Infection and inflammatory reaction due to implanted penile prosthesis, initial encounter: Secondary | ICD-10-CM | POA: Diagnosis not present

## 2021-04-25 DIAGNOSIS — Z87891 Personal history of nicotine dependence: Secondary | ICD-10-CM | POA: Diagnosis not present

## 2021-04-25 DIAGNOSIS — Z79899 Other long term (current) drug therapy: Secondary | ICD-10-CM | POA: Diagnosis not present

## 2021-05-01 DIAGNOSIS — Y838 Other surgical procedures as the cause of abnormal reaction of the patient, or of later complication, without mention of misadventure at the time of the procedure: Secondary | ICD-10-CM | POA: Diagnosis not present

## 2021-05-01 DIAGNOSIS — Z85828 Personal history of other malignant neoplasm of skin: Secondary | ICD-10-CM | POA: Diagnosis not present

## 2021-05-01 DIAGNOSIS — Z923 Personal history of irradiation: Secondary | ICD-10-CM | POA: Diagnosis not present

## 2021-05-01 DIAGNOSIS — Z87891 Personal history of nicotine dependence: Secondary | ICD-10-CM | POA: Diagnosis not present

## 2021-05-01 DIAGNOSIS — Z9889 Other specified postprocedural states: Secondary | ICD-10-CM | POA: Diagnosis not present

## 2021-05-01 DIAGNOSIS — I10 Essential (primary) hypertension: Secondary | ICD-10-CM | POA: Diagnosis not present

## 2021-05-01 DIAGNOSIS — F1290 Cannabis use, unspecified, uncomplicated: Secondary | ICD-10-CM | POA: Diagnosis not present

## 2021-05-01 DIAGNOSIS — T83410A Breakdown (mechanical) of penile (implanted) prosthesis, initial encounter: Secondary | ICD-10-CM | POA: Diagnosis not present

## 2021-05-01 DIAGNOSIS — Z79899 Other long term (current) drug therapy: Secondary | ICD-10-CM | POA: Diagnosis not present

## 2021-05-01 DIAGNOSIS — Z9079 Acquired absence of other genital organ(s): Secondary | ICD-10-CM | POA: Diagnosis not present

## 2021-05-01 DIAGNOSIS — Z8546 Personal history of malignant neoplasm of prostate: Secondary | ICD-10-CM | POA: Diagnosis not present

## 2021-05-01 DIAGNOSIS — Z8616 Personal history of COVID-19: Secondary | ICD-10-CM | POA: Diagnosis not present

## 2021-05-01 DIAGNOSIS — Z9049 Acquired absence of other specified parts of digestive tract: Secondary | ICD-10-CM | POA: Diagnosis not present

## 2021-05-01 DIAGNOSIS — Z8042 Family history of malignant neoplasm of prostate: Secondary | ICD-10-CM | POA: Diagnosis not present

## 2021-05-01 DIAGNOSIS — Y812 Prosthetic and other implants, materials and accessory general- and plastic-surgery devices associated with adverse incidents: Secondary | ICD-10-CM | POA: Diagnosis not present

## 2021-05-02 DIAGNOSIS — Y838 Other surgical procedures as the cause of abnormal reaction of the patient, or of later complication, without mention of misadventure at the time of the procedure: Secondary | ICD-10-CM | POA: Diagnosis not present

## 2021-05-02 DIAGNOSIS — Z8616 Personal history of COVID-19: Secondary | ICD-10-CM | POA: Diagnosis not present

## 2021-05-02 DIAGNOSIS — Z87891 Personal history of nicotine dependence: Secondary | ICD-10-CM | POA: Diagnosis not present

## 2021-05-02 DIAGNOSIS — I10 Essential (primary) hypertension: Secondary | ICD-10-CM | POA: Diagnosis not present

## 2021-05-02 DIAGNOSIS — Z9049 Acquired absence of other specified parts of digestive tract: Secondary | ICD-10-CM | POA: Diagnosis not present

## 2021-05-02 DIAGNOSIS — Y812 Prosthetic and other implants, materials and accessory general- and plastic-surgery devices associated with adverse incidents: Secondary | ICD-10-CM | POA: Diagnosis not present

## 2021-05-02 DIAGNOSIS — F1290 Cannabis use, unspecified, uncomplicated: Secondary | ICD-10-CM | POA: Diagnosis not present

## 2021-05-02 DIAGNOSIS — Z9889 Other specified postprocedural states: Secondary | ICD-10-CM | POA: Diagnosis not present

## 2021-05-02 DIAGNOSIS — T83410A Breakdown (mechanical) of penile (implanted) prosthesis, initial encounter: Secondary | ICD-10-CM | POA: Diagnosis not present

## 2021-05-02 DIAGNOSIS — Z8546 Personal history of malignant neoplasm of prostate: Secondary | ICD-10-CM | POA: Diagnosis not present

## 2021-05-02 DIAGNOSIS — Z9079 Acquired absence of other genital organ(s): Secondary | ICD-10-CM | POA: Diagnosis not present

## 2021-05-02 DIAGNOSIS — Z85828 Personal history of other malignant neoplasm of skin: Secondary | ICD-10-CM | POA: Diagnosis not present

## 2021-05-02 DIAGNOSIS — Z923 Personal history of irradiation: Secondary | ICD-10-CM | POA: Diagnosis not present

## 2021-05-02 DIAGNOSIS — Z79899 Other long term (current) drug therapy: Secondary | ICD-10-CM | POA: Diagnosis not present

## 2021-05-02 DIAGNOSIS — Z8042 Family history of malignant neoplasm of prostate: Secondary | ICD-10-CM | POA: Diagnosis not present

## 2021-05-15 DIAGNOSIS — I10 Essential (primary) hypertension: Secondary | ICD-10-CM | POA: Diagnosis not present

## 2021-05-15 DIAGNOSIS — Z0001 Encounter for general adult medical examination with abnormal findings: Secondary | ICD-10-CM | POA: Diagnosis not present

## 2021-05-15 DIAGNOSIS — C84A0 Cutaneous T-cell lymphoma, unspecified, unspecified site: Secondary | ICD-10-CM | POA: Diagnosis not present

## 2021-05-15 DIAGNOSIS — C61 Malignant neoplasm of prostate: Secondary | ICD-10-CM | POA: Diagnosis not present

## 2021-05-15 DIAGNOSIS — E559 Vitamin D deficiency, unspecified: Secondary | ICD-10-CM | POA: Diagnosis not present

## 2021-05-18 DIAGNOSIS — C84A0 Cutaneous T-cell lymphoma, unspecified, unspecified site: Secondary | ICD-10-CM | POA: Diagnosis not present

## 2021-05-18 DIAGNOSIS — C61 Malignant neoplasm of prostate: Secondary | ICD-10-CM | POA: Diagnosis not present

## 2021-05-18 DIAGNOSIS — E559 Vitamin D deficiency, unspecified: Secondary | ICD-10-CM | POA: Diagnosis not present

## 2021-05-18 DIAGNOSIS — I10 Essential (primary) hypertension: Secondary | ICD-10-CM | POA: Diagnosis not present

## 2021-05-24 DIAGNOSIS — T8361XA Infection and inflammatory reaction due to implanted penile prosthesis, initial encounter: Secondary | ICD-10-CM | POA: Diagnosis not present

## 2021-05-24 DIAGNOSIS — Z428 Encounter for other plastic and reconstructive surgery following medical procedure or healed injury: Secondary | ICD-10-CM | POA: Diagnosis not present

## 2021-05-31 DIAGNOSIS — T83490A Other mechanical complication of penile (implanted) prosthesis, initial encounter: Secondary | ICD-10-CM | POA: Diagnosis not present

## 2021-06-07 ENCOUNTER — Ambulatory Visit: Payer: BC Managed Care – PPO | Admitting: Internal Medicine

## 2021-06-08 ENCOUNTER — Other Ambulatory Visit: Payer: Self-pay

## 2021-06-08 ENCOUNTER — Encounter (HOSPITAL_BASED_OUTPATIENT_CLINIC_OR_DEPARTMENT_OTHER): Payer: BC Managed Care – PPO | Attending: Internal Medicine | Admitting: Internal Medicine

## 2021-06-08 DIAGNOSIS — L98498 Non-pressure chronic ulcer of skin of other sites with other specified severity: Secondary | ICD-10-CM | POA: Diagnosis not present

## 2021-06-08 DIAGNOSIS — Z8546 Personal history of malignant neoplasm of prostate: Secondary | ICD-10-CM | POA: Insufficient documentation

## 2021-06-08 DIAGNOSIS — Z923 Personal history of irradiation: Secondary | ICD-10-CM | POA: Insufficient documentation

## 2021-06-08 DIAGNOSIS — T8189XA Other complications of procedures, not elsewhere classified, initial encounter: Secondary | ICD-10-CM | POA: Diagnosis not present

## 2021-06-08 NOTE — Progress Notes (Signed)
HAIZEN, SAFER ([id number]) Visit Report for 06/08/2021 Chief Complaint Document Details Patient Name: Date of Service: Kevin [name] ID M. 06/08/2021 1:15 PM Medical Record Number: [id number] Patient Account Number: [id number] Date of Birth/Sex: Treating RN: 02/28/1956 (65 y.o. Kevin [name] Primary Care Provider: Henrine Screws Other Clinician: Referring Provider: Treating Provider/Extender: Rowland Lathe Weeks in Treatment: 0 Information Obtained from: Patient Chief Complaint 06/08/2021; patient is here for review of a wound on the surgical site on the ventral aspect of his penis Electronic Signature(s) Signed: 06/08/2021 5:31:22 PM By: Linton Ham MD Entered By: Linton Ham on 06/08/2021 14:39:44 -------------------------------------------------------------------------------- HPI Details Patient Name: Date of Service: Kevin [name], Kevin V ID M. 06/08/2021 1:15 PM Medical Record Number: [id number] Patient Account Number: [id number] Date of Birth/Sex: Treating RN: [birth year] (65 y.o. Kevin [name] Primary Care Provider: Henrine Screws Other Clinician: Referring Provider: Treating Provider/Extender: Rowland Lathe Weeks in Treatment: 0 History of Present Illness HPI Description: ADMISSION 06/08/2021 This is a 65 year old relatively well man. He has a history of prostate cancer with hormonal therapy and radiation starting in 2017. He had a radical retropubic prostatectomy with salvage radiotherapy and a history of a ureteral stone. He then underwent placement of a Coloplast 3 piece penile prosthesis in September 2021 unfortunately he developed gross hematuria. Cystoscopy showed erosion of the reservoir into his bladder on the right anterior wall. He was admitted to hospital for excision of prosthesis closure of the bladder. This was done in February of this year. He recovered without incident. In May 2022 he was taken back to surgery and  was implanted with a new 75 cc reservoir unfortunately developing signs of infection of the implant and he was referred to [facility] urology he went to surgery for prosthesis removal and had a mulcacy salvage procedure. The patient tells me he had 3 antibiotics although he is not sure which ones. He has completed antibiotics. He has an incision on the ventral aspect of his penis this dehisced about 2 weeks ago. Dr. Diona Fanti actually sent me a secure text message to see if we could work him into the clinic Past medical history includes radiation and radical prostatectomy for prostate CA in 2017. He has a history of mycosis fungoides and hypertension Electronic Signature(s) Signed: 06/08/2021 5:31:22 PM By: Linton Ham MD Entered By: Linton Ham on 06/08/2021 14:45:44 -------------------------------------------------------------------------------- Physical Exam Details Patient Name: Date of Service: Kevin [name], Kevin V ID M. 06/08/2021 1:15 PM Medical Record Number: [id number] Patient Account Number: [id number] Date of Birth/Sex: Treating RN: [birth year] (65 y.o. Kevin [name] Primary Care Provider: Henrine Screws Other Clinician: Referring Provider: Treating Provider/Extender: Rowland Lathe Weeks in Treatment: 0 Constitutional Sitting or standing Blood Pressure is within target range for patient.. Pulse regular and within target range for patient.Marland Kitchen Respirations regular, non-labored and within target range.. Temperature is normal and within the target range for the patient.Marland Kitchen Appears in no distress. Genitourinary (GU) The patient penis is in a erect position. Notes Wound exam; on the ventral aspect of the penis in the middle of the surgical incision. There is a small open area. This has roughly 0.4 cm of undermining to the right from roughly 6-12 o'clock fortunately the underlying tissue looks quite healthy there is no evidence of infection here. No surrounding  tenderness Electronic Signature(s) Signed: 06/08/2021 5:31:22 PM By: Linton Ham MD Entered By: Linton Ham on 06/08/2021 14:46:45 -------------------------------------------------------------------------------- Physician Orders Details Patient Name: Date  of Service: BA LL, Kevin V ID M. 06/08/2021 1:15 PM Medical Record Number: [id number] Patient Account Number: [id number] Date of Birth/Sex: Treating RN: 1955/11/15 (65 y.o. Kevin [name] Primary Care Provider: Henrine Screws Other Clinician: Referring Provider: Treating Provider/Extender: Rowland Lathe Weeks in Treatment: 0 Verbal / Phone Orders: No Diagnosis Coding Follow-up Appointments Return Appointment in 1 week. Bathing/ Shower/ Hygiene May shower and wash wound with soap and water. Wound Treatment Wound #1 - Penis Prim Dressing: Endoform 2x2 in 1 x Per Day/15 Days ary Discharge Instructions: Moisten with saline Secondary Dressing: Woven Gauze Sponges 2x2 in 1 x Per Day/15 Days Discharge Instructions: Apply over primary dressing as directed. Secured With: Child psychotherapist, Sterile 2x75 (in/in) 1 x Per Day/15 Days Discharge Instructions: Secure with stretch gauze or bandaid Electronic Signature(s) Signed: 06/08/2021 5:16:40 PM By: Baruch Gouty RN, BSN Signed: 06/08/2021 5:31:22 PM By: Linton Ham MD Entered By: Baruch Gouty on 06/08/2021 14:35:48 -------------------------------------------------------------------------------- Problem List Details Patient Name: Date of Service: Kevin [name], Kevin V ID M. 06/08/2021 1:15 PM Medical Record Number: [id number] Patient Account Number: [id number] Date of Birth/Sex: Treating RN: 07/05/1956 (65 y.o. Kevin [name] Primary Care Provider: Henrine Screws Other Clinician: Referring Provider: Treating Provider/Extender: Rowland Lathe Weeks in Treatment: 0 Active Problems ICD-10 Encounter Code Description  Active Date MDM Diagnosis L98.498 Non-pressure chronic ulcer of skin of other sites with other specified severity 06/08/2021 No Yes T83.61XD Infection and inflammatory reaction due to implanted penile prosthesis, 06/08/2021 No Yes subsequent encounter Inactive Problems Resolved Problems Electronic Signature(s) Signed: 06/08/2021 5:31:22 PM By: Linton Ham MD Entered By: Linton Ham on 06/08/2021 14:37:50 -------------------------------------------------------------------------------- Progress Note Details Patient Name: Date of Service: Kevin [name], Kevin V ID M. 06/08/2021 1:15 PM Medical Record Number: [id number] Patient Account Number: [id number] Date of Birth/Sex: Treating RN: 01/13/1956 (65 y.o. Kevin [name] Primary Care Provider: Henrine Screws Other Clinician: Referring Provider: Treating Provider/Extender: Rowland Lathe Weeks in Treatment: 0 Subjective Chief Complaint Information obtained from Patient 06/08/2021; patient is here for review of a wound on the surgical site on the ventral aspect of his penis History of Present Illness (HPI) ADMISSION 06/08/2021 This is a 65 year old relatively well man. He has a history of prostate cancer with hormonal therapy and radiation starting in 2017. He had a radical retropubic prostatectomy with salvage radiotherapy and a history of a ureteral stone. He then underwent placement of a Coloplast 3 piece penile prosthesis in September 2021 unfortunately he developed gross hematuria. Cystoscopy showed erosion of the reservoir into his bladder on the right anterior wall. He was admitted to hospital for excision of prosthesis closure of the bladder. This was done in February of this year. He recovered without incident. In May 2022 he was taken back to surgery and was implanted with a new 75 cc reservoir unfortunately developing signs of infection of the implant and he was referred to [facility] urology he went to surgery for  prosthesis removal and had a mulcacy salvage procedure. The patient tells me he had 3 antibiotics although he is not sure which ones. He has completed antibiotics. He has an incision on the ventral aspect of his penis this dehisced about 2 weeks ago. Dr. Diona Fanti actually sent me a secure text message to see if we could work him into the clinic Past medical history includes radiation and radical prostatectomy for prostate CA in 2017. He has a history of mycosis fungoides and hypertension  Patient History Information obtained from Patient, Chart. Allergies lisinopril (Reaction: cough) Family History Cancer - Maternal Grandparents,Father, Hypertension - Father,Mother, Stroke - Father, No family history of Diabetes, Heart Disease, Hereditary Spherocytosis, Kidney Disease, Lung Disease, Seizures, Thyroid Problems, Tuberculosis. 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 Medical History Eyes Denies history of Cataracts, Glaucoma, Optic Neuritis Ear/Nose/Mouth/Throat Denies history of Chronic sinus problems/congestion, Middle ear problems Respiratory Patient has history of Sleep Apnea - uses CPAP Cardiovascular Patient has history of Hypertension Denies history of Angina, Arrhythmia, Congestive Heart Failure, Coronary Artery Disease, Deep Vein Thrombosis, Hypotension, Myocardial Infarction, Peripheral Arterial Disease, Peripheral Venous Disease, Phlebitis, Vasculitis Genitourinary Denies history of End Stage Renal Disease Integumentary (Skin) Denies history of History of Burn Oncologic Patient has history of Received Radiation Psychiatric Denies history of Anorexia/bulimia, Confinement Anxiety Hospitalization/Surgery History - cholocystectomy. - cystoscopy, multiple. - penile prosthesis implant. - prostatectomy. - tonsillectomy. - ureteral stent placement. 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 Eyes Complains or has symptoms of Glasses / Contacts - reading. Ear/Nose/Mouth/Throat Denies complaints or symptoms of Chronic sinus problems or rhinitis. Respiratory Denies complaints or symptoms of Chronic or frequent coughs, Shortness of Breath. Cardiovascular Denies complaints or symptoms of Chest pain. Gastrointestinal Denies complaints or symptoms of Frequent diarrhea, Nausea, Vomiting. Endocrine Denies complaints or symptoms of Heat/cold intolerance. Genitourinary Denies complaints or symptoms of Frequent urination. Integumentary (Skin) Complains or has symptoms of Wounds - penis. Musculoskeletal Denies complaints or symptoms of Muscle Pain, Muscle Weakness. Neurologic Denies complaints or symptoms of Numbness/parasthesias. Psychiatric Denies complaints or symptoms of Claustrophobia, Suicidal. Objective Constitutional Sitting or standing Blood Pressure is within target range for patient.. Pulse regular and within target range for patient.Marland Kitchen Respirations regular, non-labored and within target range.. Temperature is normal and within the target range for the patient.Marland Kitchen Appears in no distress. Vitals Time Taken: 1:40 PM, Height: 72 in, Source: Stated, Weight: 205 lbs, Source: Stated, BMI: 27.8, Temperature: 98.3 F, Pulse: 66 bpm, Respiratory Rate: 18 breaths/min, Blood Pressure: 129/87 mmHg. Genitourinary (GU) The patient penis is in a erect position. General Notes: Wound exam; on the ventral aspect of the penis in the middle of the surgical incision. There is a small open area. This has roughly 0.4 cm of undermining to the right from roughly 6-12 o'clock fortunately the underlying tissue looks  quite healthy there is no evidence of infection here. No surrounding tenderness Integumentary (Hair, Skin) Wound #1 status is Open. Original cause of wound was Surgical Injury. The date acquired was: 05/15/2021. The wound is located on the Penis. The wound measures 0.8cm length x 0.3cm width x 0.3cm depth; 0.188cm^2 area and 0.057cm^3 volume. There is Fat Layer (Subcutaneous Tissue) exposed. There is no tunneling noted, however, there is undermining starting at 12:00 and ending at 6:00 with a maximum distance of 0.4cm. There is a none present amount of drainage noted. The wound margin is epibole. There is large (67-100%) red granulation within the wound bed. There is no necrotic tissue within the wound bed. 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 Bathing/ Shower/ Hygiene: May shower and wash wound with soap and water. WOUND #1: - Penis Wound Laterality: Prim Dressing: Endoform 2x2 in 1 x Per Day/15 Days ary Discharge Instructions: Moisten with saline Secondary Dressing: Woven Gauze Sponges 2x2 in 1 x Per Day/15 Days Discharge Instructions: Apply over primary dressing as directed. Secured With: Child psychotherapist, Sterile 2x75 (in/in) 1 x Per Day/15 Days Discharge Instructions: Secure with stretch gauze or bandaid 1. Surgical wound dehiscence on the ventral aspect the patient's penis. 2. He had an implant infection and apparently was on a course of 3 different antibiotics in my review of things I did not see the actual antibiotics in any case he is completed this and there is no current evidence of infection 3. We are going to use endoform moistened held in place with gauze. His wife should be able to help him manage the dressings. Ideally this will be  changed every second day so as not to dislodge the wound too much but it he seems determined to change this daily 4. In 1 week's time he is traveling on a family trip to [place] we will see him back next Wednesday. He should be able to continue to change the dressing there. I spent 35 minutes in review of this patient's past medical history, face-to-face evaluation and preparation of this record Electronic Signature(s) Signed: 06/08/2021 5:31:22 PM By: Linton Ham MD Entered By: Linton Ham on 06/08/2021 14:48:21 -------------------------------------------------------------------------------- HxROS Details Patient Name: Date of Service: Kevin [name], Kevin V ID M. 06/08/2021 1:15 PM Medical Record Number: [id number] Patient Account Number: [id number] Date of Birth/Sex: Treating RN: [birth year] (65 y.o. Kevin [name] Primary Care Provider: Henrine Screws Other Clinician: Referring Provider: Treating Provider/Extender: Rowland Lathe Weeks in Treatment: 0 Information Obtained From Patient Chart Constitutional Symptoms (General Health) Complaints and Symptoms: Negative for: Fatigue; Fever; Chills; Marked Weight Change Eyes Complaints and Symptoms: Positive for: Glasses / Contacts - reading Medical History: Negative for: Cataracts; Glaucoma; Optic Neuritis Ear/Nose/Mouth/Throat Complaints and Symptoms: Negative for: Chronic sinus problems or rhinitis Medical History: Negative for: Chronic sinus problems/congestion; Middle ear problems Respiratory Complaints and Symptoms: Negative for: Chronic or frequent coughs; Shortness of Breath Medical History: Positive for: Sleep Apnea - uses CPAP Cardiovascular Complaints and Symptoms: Negative for: Chest pain Medical History: Positive for: Hypertension Negative for: Angina; Arrhythmia; Congestive Heart Failure; Coronary Artery Disease; Deep Vein Thrombosis; Hypotension; Myocardial Infarction; Peripheral Arterial  Disease; Peripheral Venous Disease; Phlebitis; Vasculitis Gastrointestinal Complaints and Symptoms: Negative for: Frequent diarrhea; Nausea; Vomiting Endocrine Complaints and Symptoms: Negative for: Heat/cold intolerance Genitourinary Complaints and Symptoms: Negative for: Frequent urination Medical History: Negative for: End Stage Renal Disease Past Medical History Notes: penile implant Integumentary (Skin) Complaints and Symptoms: Positive for: Wounds - penis Medical History: Negative for: History of Burn Musculoskeletal Complaints and Symptoms: Negative for: Muscle Pain; Muscle Weakness Neurologic Complaints and Symptoms: Negative for: Numbness/parasthesias Psychiatric Complaints and Symptoms: Negative for: Claustrophobia; Suicidal Medical History: Negative for: Anorexia/bulimia; Confinement Anxiety Hematologic/Lymphatic Medical History: Past Medical History Notes: T-cell lymphoma Immunological Oncologic Medical History: Positive for: Received Radiation Past Medical History Notes: prostate cancer, received hormone therapy Immunizations Pneumococcal Vaccine: Received Pneumococcal Vaccination: No Implantable Devices Yes Hospitalization / Surgery History Type of Hospitalization/Surgery cholocystectomy cystoscopy, multiple penile prosthesis implant prostatectomy tonsillectomy ureteral stent placement Family and Social History Cancer: Yes - Maternal Grandparents,Father; Diabetes: No; Heart Disease: No; Hereditary Spherocytosis: No; Hypertension: Yes - Father,Mother; Kidney Disease: No; Lung Disease: No; Seizures: No;  Stroke: Yes - Father; Thyroid Problems: No; Tuberculosis: No; Former smoker - quit 8 yrs ago; Marital Status - Married; Alcohol Use: Daily; Drug Use: Current History - TCH; Caffeine Use: Daily - coffee; Financial Concerns: No; Food, Clothing or Shelter Needs: No; Support System Lacking: No; Transportation Concerns: No Electronic  Signature(s) Signed: 06/08/2021 5:16:40 PM By: Baruch Gouty RN, BSN Signed: 06/08/2021 5:31:22 PM By: Linton Ham MD Entered By: Baruch Gouty on 06/08/2021 13:50:27 -------------------------------------------------------------------------------- SuperBill Details Patient Name: Date of Service: Kevin [name], Kevin V ID M. 06/08/2021 Medical Record Number: [id number] Patient Account Number: [id number] Date of Birth/Sex: Treating RN: 1956/05/26 (65 y.o. Kevin [name] Primary Care Provider: Henrine Screws Other Clinician: Referring Provider: Treating Provider/Extender: Rowland Lathe Weeks in Treatment: 0 Diagnosis Coding ICD-10 Codes Code Description [id number] Non-pressure chronic ulcer of skin of other sites with other specified severity T83.61XD Infection and inflammatory reaction due to implanted penile prosthesis, subsequent encounter Facility Procedures CPT4 Code: [id number] Description: 99214 - WOUND CARE VISIT-LEV 4 EST PT Modifier: Quantity: 1 Physician Procedures : CPT4 Code Description Modifier [id number] [place] PHYS LEVEL 3 NEW PT 1 ICD-10 Diagnosis Description L98.498 Non-pressure chronic ulcer of skin of other sites with other specified severity T83.61XD Infection and inflammatory reaction due to implanted penile  prosthesis, subsequent encounter Quantity: Electronic Signature(s) Signed: 06/08/2021 5:31:22 PM By: Linton Ham MD Entered By: Linton Ham on 06/08/2021 14:49:03

## 2021-06-08 NOTE — Progress Notes (Signed)
CORRIGAN, WANEK ([id number]) Visit Report for 06/08/2021 Allergy List Details Patient Name: Date of Service: Kevin [name] ID M. 06/08/2021 1:15 PM Medical Record Number: [id number] Patient Account Number: [id number] Date of Birth/Sex: Treating RN: [birth year] (65 y.o. Kevin [name] Primary Care Camera Krienke: Henrine Screws Other Clinician: Referring Islah Eve: Treating Laurine Kuyper/Extender: Rowland Lathe Weeks in Treatment: 0 Allergies Active Allergies lisinopril Reaction: cough Allergy Notes Electronic Signature(s) Signed: 06/08/2021 5:16:40 PM By: Baruch Gouty RN, BSN Entered By: Baruch Gouty on 06/08/2021 13:41:26 -------------------------------------------------------------------------------- Arrival Information Details Patient Name: Date of Service: Kevin [name], Kevin V ID M. 06/08/2021 1:15 PM Medical Record Number: [id number] Patient Account Number: [id number] Date of Birth/Sex: Treating RN: 1956-07-20 (65 y.o. Kevin [name] Primary Care Rodnisha Blomgren: Henrine Screws Other Clinician: Referring Verla Bryngelson: Treating Anira Senegal/Extender: Rowland Lathe Weeks in Treatment: 0 Visit Information Patient Arrived: Ambulatory Arrival Time: 13:34 Accompanied By: self Transfer Assistance: None Patient Identification Verified: Yes Secondary Verification Process Completed: Yes Patient Requires Transmission-Based Precautions: No Patient Has Alerts: No Electronic Signature(s) Signed: 06/08/2021 5:16:40 PM By: Baruch Gouty RN, BSN Entered By: Baruch Gouty on 06/08/2021 13:39:48 -------------------------------------------------------------------------------- Clinic Level of Care Assessment Details Patient Name: Date of Service: Kevin [name], Kevin V ID M. 06/08/2021 1:15 PM Medical Record Number: [id number] Patient Account Number: [id number] Date of Birth/Sex: Treating RN: Mar 18, 1956 (65 y.o. Kevin [name] Primary Care Odies Desa: Henrine Screws  Other Clinician: Referring Abrie Egloff: Treating Draven Laine/Extender: Rowland Lathe Weeks in Treatment: 0 Clinic Level of Care Assessment Items TOOL 2 Quantity Score '[]'$  - 0 Use when only an EandM is performed on the INITIAL visit ASSESSMENTS - Nursing Assessment / Reassessment X- 1 20 General Physical Exam (combine w/ comprehensive assessment (listed just below) when performed on new pt. evals) X- 1 25 Comprehensive Assessment (HX, ROS, Risk Assessments, Wounds Hx, etc.) ASSESSMENTS - Wound and Skin A ssessment / Reassessment X - Simple Wound Assessment / Reassessment - one wound 1 5 '[]'$  - 0 Complex Wound Assessment / Reassessment - multiple wounds '[]'$  - 0 Dermatologic / Skin Assessment (not related to wound area) ASSESSMENTS - Ostomy and/or Continence Assessment and Care '[]'$  - 0 Incontinence Assessment and Management '[]'$  - 0 Ostomy Care Assessment and Management (repouching, etc.) PROCESS - Coordination of Care X - Simple Patient / Family Education for ongoing care 1 15 '[]'$  - 0 Complex (extensive) Patient / Family Education for ongoing care X- 1 10 Staff obtains Programmer, systems, Records, T Results / Process Orders est '[]'$  - 0 Staff telephones HHA, Nursing Homes / Clarify orders / etc '[]'$  - 0 Routine Transfer to another Facility (non-emergent condition) '[]'$  - 0 Routine Hospital Admission (non-emergent condition) X- 1 15 New Admissions / Biomedical engineer / Ordering NPWT Apligraf, etc. , '[]'$  - 0 Emergency Hospital Admission (emergent condition) X- 1 10 Simple Discharge Coordination '[]'$  - 0 Complex (extensive) Discharge Coordination PROCESS - Special Needs '[]'$  - 0 Pediatric / Minor Patient Management '[]'$  - 0 Isolation Patient Management '[]'$  - 0 Hearing / Language / Visual special needs '[]'$  - 0 Assessment of Community assistance (transportation, D/C planning, etc.) '[]'$  - 0 Additional assistance / Altered mentation '[]'$  - 0 Support Surface(s) Assessment (bed,  cushion, seat, etc.) INTERVENTIONS - Wound Cleansing / Measurement X- 1 5 Wound Imaging (photographs - any number of wounds) '[]'$  - 0 Wound Tracing (instead of photographs) X- 1 5 Simple Wound Measurement - one wound '[]'$  - 0 Complex Wound Measurement - multiple wounds  X- 1 5 Simple Wound Cleansing - one wound '[]'$  - 0 Complex Wound Cleansing - multiple wounds INTERVENTIONS - Wound Dressings X - Small Wound Dressing one or multiple wounds 1 10 '[]'$  - 0 Medium Wound Dressing one or multiple wounds '[]'$  - 0 Large Wound Dressing one or multiple wounds '[]'$  - 0 Application of Medications - injection INTERVENTIONS - Miscellaneous '[]'$  - 0 External ear exam '[]'$  - 0 Specimen Collection (cultures, biopsies, blood, body fluids, etc.) '[]'$  - 0 Specimen(s) / Culture(s) sent or taken to Lab for analysis '[]'$  - 0 Patient Transfer (multiple staff / Harrel Lemon Lift / Similar devices) '[]'$  - 0 Simple Staple / Suture removal (25 or less) '[]'$  - 0 Complex Staple / Suture removal (26 or more) '[]'$  - 0 Hypo / Hyperglycemic Management (close monitor of Blood Glucose) '[]'$  - 0 Ankle / Brachial Index (ABI) - do not check if billed separately Has the patient been seen at the hospital within the last three years: Yes Total Score: 125 Level Of Care: New/Established - Level 4 Electronic Signature(s) Signed: 06/08/2021 5:16:40 PM By: Baruch Gouty RN, BSN Entered By: Baruch Gouty on 06/08/2021 14:31:18 -------------------------------------------------------------------------------- Encounter Discharge Information Details Patient Name: Date of Service: Kevin [name], Kevin V ID M. 06/08/2021 1:15 PM Medical Record Number: [id number] Patient Account Number: [id number] Date of Birth/Sex: Treating RN: 11/12/1955 (65 y.o. Kevin [name] Primary Care Chestine Belknap: Henrine Screws Other Clinician: Referring Osmani Kersten: Treating Abass Misener/Extender: Rowland Lathe Weeks in Treatment: 0 Encounter Discharge Information  Items Discharge Condition: Stable Ambulatory Status: Ambulatory Discharge Destination: Home Transportation: Private Auto Accompanied By: self Schedule Follow-up Appointment: Yes Clinical Summary of Care: Patient Declined Electronic Signature(s) Signed: 06/08/2021 5:16:40 PM By: Baruch Gouty RN, BSN Entered By: Baruch Gouty on 06/08/2021 14:51:12 -------------------------------------------------------------------------------- Lower Extremity Assessment Details Patient Name: Date of Service: Kevin [name], Kevin V ID M. 06/08/2021 1:15 PM Medical Record Number: [id number] Patient Account Number: [id number] Date of Birth/Sex: Treating RN: Jan 28, 1956 (65 y.o. Kevin [name] Primary Care Essense Bousquet: Henrine Screws Other Clinician: Referring Doriana Mazurkiewicz: Treating Caela Huot/Extender: Rowland Lathe Weeks in Treatment: 0 Electronic Signature(s) Signed: 06/08/2021 5:16:40 PM By: Baruch Gouty RN, BSN Entered By: Baruch Gouty on 06/08/2021 13:52:35 -------------------------------------------------------------------------------- Multi Wound Chart Details Patient Name: Date of Service: Kevin [name], Kevin V ID M. 06/08/2021 1:15 PM Medical Record Number: [id number] Patient Account Number: [id number] Date of Birth/Sex: Treating RN: [birth year] (65 y.o. Hessie Diener Primary Care Kemari Narez: Henrine Screws Other Clinician: Referring Cairo Lingenfelter: Treating Mega Kinkade/Extender: Rowland Lathe Weeks in Treatment: 0 Vital Signs Height(in): 72 Pulse(bpm): 84 Weight(lbs): 205 Blood Pressure(mmHg): 129/87 Body Mass Index(BMI): 28 Temperature(F): 98.3 Respiratory Rate(breaths/min): 18 Photos: [N/A:N/A] Penis N/A N/A Wound Location: Surgical Injury N/A N/A Wounding Event: Open Surgical Wound N/A N/A Primary Etiology: Sleep Apnea, Hypertension, Received N/A N/A Comorbid History: Radiation 05/15/2021 N/A N/A Date Acquired: 0 N/A N/A Weeks of  Treatment: Open N/A N/A Wound Status: 0.8x0.3x0.3 N/A N/A Measurements L x W x D (cm) 0.188 N/A N/A A (cm) : rea 0.057 N/A N/A Volume (cm) : 0.00% N/A N/A % Reduction in A rea: 0.00% N/A N/A % Reduction in Volume: 12 Starting Position 1 (o'clock): 6 Ending Position 1 (o'clock): 0.4 Maximum Distance 1 (cm): Yes N/A N/A Undermining: Full Thickness Without Exposed N/A N/A Classification: Support Structures None Present N/A N/A Exudate Amount: Epibole N/A N/A Wound Margin: Large (67-100%) N/A N/A Granulation Amount: Red N/A N/A Granulation Quality: None Present (0%) N/A N/A  Necrotic Amount: Fat Layer (Subcutaneous Tissue): Yes N/A N/A Exposed Structures: Fascia: No Tendon: No Muscle: No Joint: No Bone: No Small (1-33%) N/A N/A Epithelialization: Treatment Notes Electronic Signature(s) Signed: 06/08/2021 5:31:22 PM By: Linton Ham MD Signed: 06/08/2021 5:36:38 PM By: Deon Pilling Entered By: Linton Ham on 06/08/2021 14:37:59 -------------------------------------------------------------------------------- Multi-Disciplinary Care Plan Details Patient Name: Date of Service: Kevin [name], Kevin V ID M. 06/08/2021 1:15 PM Medical Record Number: [id number] Patient Account Number: [id number] Date of Birth/Sex: Treating RN: [birth year] (65 y.o. Kevin [name] Primary Care Alanis Clift: Henrine Screws Other Clinician: Referring Rechel Delosreyes: Treating Giavanni Odonovan/Extender: Rowland Lathe Weeks in Treatment: 0 Multidisciplinary Care Plan reviewed with physician Active Inactive Wound/Skin Impairment Nursing Diagnoses: Impaired tissue integrity Knowledge deficit related to ulceration/compromised skin integrity Goals: Patient/caregiver will verbalize understanding of skin care regimen Date Initiated: 06/08/2021 Target Resolution Date: 07/06/2021 Goal Status: Active Ulcer/skin breakdown will have a volume reduction of 30% by week 4 Date Initiated:  06/08/2021 Target Resolution Date: 07/06/2021 Goal Status: Active Interventions: Assess patient/caregiver ability to obtain necessary supplies Assess patient/caregiver ability to perform ulcer/skin care regimen upon admission and as needed Assess ulceration(s) every visit Provide education on ulcer and skin care Treatment Activities: Skin care regimen initiated : 06/08/2021 Topical wound management initiated : 06/08/2021 Notes: Electronic Signature(s) Signed: 06/08/2021 5:16:40 PM By: Baruch Gouty RN, BSN Entered By: Baruch Gouty on 06/08/2021 14:27:58 -------------------------------------------------------------------------------- Pain Assessment Details Patient Name: Date of Service: Kevin [name], Kevin V ID M. 06/08/2021 1:15 PM Medical Record Number: [id number] Patient Account Number: [id number] Date of Birth/Sex: Treating RN: [birth year] (65 y.o. Kevin [name] Primary Care Gisselle Galvis: Henrine Screws Other Clinician: Referring Leahmarie Gasiorowski: Treating Makiyah Zentz/Extender: Rowland Lathe Weeks in Treatment: 0 Active Problems Location of Pain Severity and Description of Pain Patient Has Paino No Patient Has Paino No Site Locations Rate the pain. Current Pain Level: 0 Pain Management and Medication Current Pain Management: Electronic Signature(s) Signed: 06/08/2021 5:16:40 PM By: Baruch Gouty RN, BSN Entered By: Baruch Gouty on 06/08/2021 14:01:10 -------------------------------------------------------------------------------- Patient/Caregiver Education Details Patient Name: Date of Service: Kevin [name], Rhina Brackett 9/1/2022andnbsp1:15 PM Medical Record Number: [id number] Patient Account Number: [id number] Date of Birth/Gender: Treating RN: 1956-08-26 (65 y.o. Kevin [name] Primary Care Physician: Henrine Screws Other Clinician: Referring Physician: Treating Physician/Extender: Earl Many in Treatment: 0 Education  Assessment Education Provided To: Patient Education Topics Provided Welcome T [facility]: o Handouts: Welcome T [facility] o Methods: Explain/Verbal Responses: Reinforcements needed, State content correctly Wound/Skin Impairment: Handouts: Caring for Your Ulcer, Skin Care Do's and Dont's Methods: Explain/Verbal, Printed Responses: Reinforcements needed, State content correctly Electronic Signature(s) Signed: 06/08/2021 5:16:40 PM By: Baruch Gouty RN, BSN Entered By: Baruch Gouty on 06/08/2021 14:28:43 -------------------------------------------------------------------------------- Wound Assessment Details Patient Name: Date of Service: Kevin [name], Kevin V ID M. 06/08/2021 1:15 PM Medical Record Number: [id number] Patient Account Number: [id number] Date of Birth/Sex: Treating RN: 07/21/1956 (65 y.o. Kevin [name] Primary Care Jorma Tassinari: Henrine Screws Other Clinician: Referring Dishon Kehoe: Treating Beaumont Austad/Extender: Rowland Lathe Weeks in Treatment: 0 Wound Status Wound Number: 1 Primary Etiology: Open Surgical Wound Wound Location: Penis Wound Status: Open Wounding Event: Surgical Injury Comorbid History: Sleep Apnea, Hypertension, Received Radiation Date Acquired: 05/15/2021 Weeks Of Treatment: 0 Clustered Wound: No Photos Wound Measurements Length: (cm) 0.8 Width: (cm) 0.3 Depth: (cm) 0.3 Area: (cm) 0.188 Volume: (cm) 0.057 % Reduction in Area: 0% % Reduction in  Volume: 0% Epithelialization: Small (1-33%) Tunneling: No Undermining: Yes Starting Position (o'clock): 12 Ending Position (o'clock): 6 Maximum Distance: (cm) 0.4 Wound Description Classification: Full Thickness Without Exposed Support Structu Wound Margin: Epibole Exudate Amount: None Present res Foul Odor After Cleansing: No Slough/Fibrino No Wound Bed Granulation Amount: Large (67-100%) Exposed Structure Granulation Quality: Red Fascia Exposed:  No Necrotic Amount: None Present (0%) Fat Layer (Subcutaneous Tissue) Exposed: Yes Tendon Exposed: No Muscle Exposed: No Joint Exposed: No Bone Exposed: No Treatment Notes Wound #1 (Penis) Cleanser Peri-Wound Care Topical Primary Dressing Endoform 2x2 in Discharge Instruction: Moisten with saline Secondary Dressing Woven Gauze Sponges 2x2 in Discharge Instruction: Apply over primary dressing as directed. Secured With Child psychotherapist, Sterile 2x75 (in/in) Discharge Instruction: Secure with stretch gauze or bandaid Compression Wrap Compression Stockings Add-Ons Electronic Signature(s) Signed: 06/08/2021 5:16:40 PM By: Baruch Gouty RN, BSN Entered By: Baruch Gouty on 06/08/2021 14:30:00 -------------------------------------------------------------------------------- Vitals Details Patient Name: Date of Service: Kevin [name], Kevin V ID M. 06/08/2021 1:15 PM Medical Record Number: [id number] Patient Account Number: [id number] Date of Birth/Sex: Treating RN: [birth year] (65 y.o. Kevin [name] Primary Care Danaija Eskridge: Henrine Screws Other Clinician: Referring Alrick Cubbage: Treating Malikai Gut/Extender: Rowland Lathe Weeks in Treatment: 0 Vital Signs Time Taken: 13:40 Temperature (F): 98.3 Height (in): 72 Pulse (bpm): 66 Source: Stated Respiratory Rate (breaths/min): 18 Weight (lbs): 205 Blood Pressure (mmHg): 129/87 Source: Stated Reference Range: 80 - 120 mg / dl Body Mass Index (BMI): 27.8 Electronic Signature(s) Signed: 06/08/2021 5:16:40 PM By: Baruch Gouty RN, BSN Entered By: Baruch Gouty on 06/08/2021 13:41:05

## 2021-06-08 NOTE — Progress Notes (Signed)
CABELL, BARAY ([id number]) Visit Report for 06/08/2021 Abuse/Suicide Risk Screen Details Patient Name: Date of Service: Kevin [name] ID M. 06/08/2021 1:15 PM Medical Record Number: [id number] Patient Account Number: [id number] Date of Birth/Sex: Treating RN: 04-27-1956 (65 y.o. Ernestene Mention Primary Care Delio Slates: Henrine Screws Other Clinician: Referring Lemario Chaikin: Treating Hakim Minniefield/Extender: Rowland Lathe Weeks in Treatment: 0 Abuse/Suicide Risk Screen Items Answer ABUSE RISK SCREEN: Has anyone close to you tried to hurt or harm you recentlyo No Do you feel uncomfortable with anyone in your familyo No Has anyone forced you do things that you didnt want to doo No Electronic Signature(s) Signed: 06/08/2021 5:16:40 PM By: Baruch Gouty RN, BSN Entered By: Baruch Gouty on 06/08/2021 13:50:38 -------------------------------------------------------------------------------- Activities of Daily Living Details Patient Name: Date of Service: Kevin [name] ID M. 06/08/2021 1:15 PM Medical Record Number: [id number] Patient Account Number: [id number] Date of Birth/Sex: Treating RN: [birth year] (65 y.o. Ernestene Mention Primary Care Alexei Ey: Henrine Screws Other Clinician: Referring Shahir Karen: Treating Quandre Polinski/Extender: Rowland Lathe Weeks in Treatment: 0 Activities of Daily Living Items Answer Activities of Daily Living (Please select one for each item) Drive Automobile Completely Able T Medications ake Completely Able Use T elephone Completely Able Care for Appearance Completely Able Use T oilet Completely Able Bath / Shower Completely Able Dress Self Completely Able Feed Self Completely Able Walk Completely Able Get In / Out Bed Completely Able Housework Completely Able Prepare Meals Completely Able Handle Money Completely Able Shop for Self Completely Able Electronic Signature(s) Signed: 06/08/2021 5:16:40 PM By: Baruch Gouty RN, BSN Entered By: Baruch Gouty on 06/08/2021 13:50:58 -------------------------------------------------------------------------------- Education Screening Details Patient Name: Date of Service: [name], DA V ID M. 06/08/2021 1:15 PM Medical Record Number: [id number] Patient Account Number: [id number] Date of Birth/Sex: Treating RN: January 23, 1956 (65 y.o. Ernestene Mention Primary Care Lela Murfin: Henrine Screws Other Clinician: Referring Melroy Bougher: Treating Tyera Hansley/Extender: Rowland Lathe Weeks in Treatment: 0 Primary Learner Assessed: Patient Learning Preferences/Education Level/Primary Language Learning Preference: Explanation, Demonstration, Printed Material Highest Education Level: College or Above Preferred Language: English Cognitive Barrier Language Barrier: No Translator Needed: No Memory Deficit: No Emotional Barrier: No Cultural/Religious Beliefs Affecting Medical Care: No Physical Barrier Impaired Vision: No Impaired Hearing: No Decreased Hand dexterity: No Knowledge/Comprehension Knowledge Level: High Comprehension Level: High Ability to understand written instructions: High Ability to understand verbal instructions: High Motivation Anxiety Level: Calm Cooperation: Cooperative Education Importance: Acknowledges Need Interest in Health Problems: Asks Questions Perception: Coherent Willingness to Engage in Self-Management High Activities: Readiness to Engage in Self-Management High Activities: Electronic Signature(s) Signed: 06/08/2021 5:16:40 PM By: Baruch Gouty RN, BSN Entered By: Baruch Gouty on 06/08/2021 13:51:40 -------------------------------------------------------------------------------- Fall Risk Assessment Details Patient Name: Date of Service: BA LL, DA V ID M. 06/08/2021 1:15 PM Medical Record Number: [id number] Patient Account Number: [id number] Date of Birth/Sex: Treating RN: May 30, 1956 (65 y.o. Ernestene Mention Primary Care Santhosh Gulino: Henrine Screws Other Clinician: Referring Jazper Nikolai: Treating Jewels Langone/Extender: Rowland Lathe Weeks in Treatment: 0 Fall Risk Assessment Items Have you had 2 or more falls in the last 12 monthso 0 No Have you had any fall that resulted in injury in the last 12 monthso 0 No FALLS RISK SCREEN History of falling - immediate or within 3 months 0 No Secondary diagnosis (Do you have 2 or more medical diagnoseso) 0 No Ambulatory aid None/bed rest/wheelchair/nurse 0 Yes Crutches/cane/walker 0 No Furniture 0 No  Intravenous therapy Access/Saline/Heparin Lock 0 No Gait/Transferring Normal/ bed rest/ wheelchair 0 Yes Weak (short steps with or without shuffle, stooped but able to lift head while walking, may seek 0 No support from furniture) Impaired (short steps with shuffle, may have difficulty arising from chair, head down, impaired 0 No balance) Mental Status Oriented to own ability 0 Yes Electronic Signature(s) Signed: 06/08/2021 5:16:40 PM By: Baruch Gouty RN, BSN Entered By: Baruch Gouty on 06/08/2021 13:51:52 -------------------------------------------------------------------------------- Foot Assessment Details Patient Name: Date of Service: [name], DA V ID M. 06/08/2021 1:15 PM Medical Record Number: [id number] Patient Account Number: [id number] Date of Birth/Sex: Treating RN: June 03, 1956 (65 y.o. Ernestene Mention Primary Care Delorus Langwell: Henrine Screws Other Clinician: Referring Greysen Swanton: Treating Pernie Grosso/Extender: Rowland Lathe Weeks in Treatment: 0 Foot Assessment Items Site Locations + = Sensation present, - = Sensation absent, C = Callus, U = Ulcer R = Redness, W = Warmth, M = Maceration, PU = Pre-ulcerative lesion F = Fissure, S = Swelling, D = Dryness Assessment Right: Left: Other Deformity: No No Prior Foot Ulcer: No No Prior Amputation: No No Charcot Joint: No  No Ambulatory Status: Ambulatory Without Help Gait: Steady Electronic Signature(s) Signed: 06/08/2021 5:16:40 PM By: Baruch Gouty RN, BSN Entered By: Baruch Gouty on 06/08/2021 13:52:29 -------------------------------------------------------------------------------- Nutrition Risk Screening Details Patient Name: Date of Service: [name], DA V ID M. 06/08/2021 1:15 PM Medical Record Number: [id number] Patient Account Number: [id number] Date of Birth/Sex: Treating RN: 1956/05/30 (65 y.o. Ernestene Mention Primary Care Treavon Castilleja: Henrine Screws Other Clinician: Referring Dariel Betzer: Treating Glynn Yepes/Extender: Rowland Lathe Weeks in Treatment: 0 Height (in): 72 Weight (lbs): 205 Body Mass Index (BMI): 27.8 Nutrition Risk Screening Items Score Screening NUTRITION RISK SCREEN: I have an illness or condition that made me change the kind and/or amount of food I eat 0 No I eat fewer than two meals per day 0 No I eat few fruits and vegetables, or milk products 0 No I have three or more drinks of beer, liquor or wine almost every day 0 No I have tooth or mouth problems that make it hard for me to eat 0 No I don't always have enough money to buy the food I need 0 No I eat alone most of the time 0 No I take three or more different prescribed or over-the-counter drugs a day 1 Yes Without wanting to, I have lost or gained 10 pounds in the last six months 0 No I am not always physically able to shop, cook and/or feed myself 0 No Nutrition Protocols Good Risk Protocol 0 No interventions needed Moderate Risk Protocol High Risk Proctocol Risk Level: Good Risk Score: 1 Electronic Signature(s) Signed: 06/08/2021 5:16:40 PM By: Baruch Gouty RN, BSN Entered By: Baruch Gouty on 06/08/2021 13:52:13

## 2021-06-13 DIAGNOSIS — C61 Malignant neoplasm of prostate: Secondary | ICD-10-CM | POA: Diagnosis not present

## 2021-06-14 ENCOUNTER — Other Ambulatory Visit: Payer: Self-pay

## 2021-06-14 ENCOUNTER — Encounter (HOSPITAL_BASED_OUTPATIENT_CLINIC_OR_DEPARTMENT_OTHER): Payer: BC Managed Care – PPO | Admitting: Internal Medicine

## 2021-06-14 DIAGNOSIS — T8189XA Other complications of procedures, not elsewhere classified, initial encounter: Secondary | ICD-10-CM | POA: Diagnosis not present

## 2021-06-14 NOTE — Progress Notes (Signed)
AYON, GIPPLE ([id number]) Visit Report for 06/14/2021 Arrival Information Details Patient Name: Date of Service: [place], [place] ID M. 06/14/2021 9:30 A M Medical Record Number: [id number] Patient Account Number: [id number] Date of Birth/Sex: Treating RN: 09-27-1956 (65 y.o. Janyth Contes Primary Care Shelbia Scinto: Henrine Screws Other Clinician: Referring Hunter Bachar: Treating Brystal Kildow/Extender: Rowland Lathe Weeks in Treatment: 0 Visit Information History Since Last Visit Added or deleted any medications: No Patient Arrived: Ambulatory Any new allergies or adverse reactions: No Arrival Time: 09:48 Had a fall or experienced change in No Accompanied By: alone activities of daily living that may affect Transfer Assistance: None risk of falls: Patient Requires Transmission-Based Precautions: No Signs or symptoms of abuse/neglect since last visito No Patient Has Alerts: No Hospitalized since last visit: No Implantable device outside of the clinic excluding No cellular tissue based products placed in the center since last visit: Has Dressing in Place as Prescribed: Yes Pain Present Now: No Electronic Signature(s) Signed: 06/14/2021 5:49:01 PM By: Levan Hurst RN, BSN Entered By: Levan Hurst on 06/14/2021 09:53:22 -------------------------------------------------------------------------------- Multi Wound Chart Details Patient Name: Date of Service: [name], [place] ID M. 06/14/2021 9:30 A M Medical Record Number: [id number] Patient Account Number: [id number] Date of Birth/Sex: Treating RN: 06-10-1956 (65 y.o. Janyth Contes Primary Care Jonnae Fonseca: Henrine Screws Other Clinician: Referring Josphine Laffey: Treating Tara Rud/Extender: Rowland Lathe Weeks in Treatment: 0 Vital Signs Height(in): 72 Pulse(bpm): 71 Weight(lbs): 205 Blood Pressure(mmHg): 128/88 Body Mass Index(BMI): 28 Temperature(F): 97.9 Respiratory Rate(breaths/min):  16 Photos: [1:Penis] [N/A:N/A N/A] Wound Location: [1:Surgical Injury] [N/A:N/A] Wounding Event: [1:Open Surgical Wound] [N/A:N/A] Primary Etiology: [1:Sleep Apnea, Hypertension, Received N/A] Comorbid History: [1:Radiation 05/15/2021] [N/A:N/A] Date Acquired: [1:0] [N/A:N/A] Weeks of Treatment: [1:Open] [N/A:N/A] Wound Status: [1:0.5x0.3x0.1] [N/A:N/A] Measurements L x W x D (cm) [1:0.118] [N/A:N/A] A (cm) : rea [1:0.012] [N/A:N/A] Volume (cm) : [1:37.20%] [N/A:N/A] % Reduction in Area: [1:78.90%] [N/A:N/A] % Reduction in Volume: [1:Full Thickness Without Exposed] [N/A:N/A] Classification: [1:Support Structures Medium] [N/A:N/A] Exudate Amount: [1:Serosanguineous] [N/A:N/A] Exudate Type: [1:red, brown] [N/A:N/A] Exudate Color: [1:Epibole] [N/A:N/A] Wound Margin: [1:Large (67-100%)] [N/A:N/A] Granulation Amount: [1:Pink] [N/A:N/A] Granulation Quality: [1:None Present (0%)] [N/A:N/A] Necrotic Amount: [1:Fat Layer (Subcutaneous Tissue): Yes N/A] Exposed Structures: [1:Fascia: No Tendon: No Muscle: No Joint: No Bone: No Medium (34-66%)] [N/A:N/A] Treatment Notes Electronic Signature(s) Signed: 06/14/2021 3:40:46 PM By: Linton Ham MD Signed: 06/14/2021 5:49:01 PM By: Levan Hurst RN, BSN Entered By: Linton Ham on 06/14/2021 10:06:28 -------------------------------------------------------------------------------- Pain Assessment Details Patient Name: Date of Service: [name], DA V ID M. 06/14/2021 9:30 A M Medical Record Number: [id number] Patient Account Number: [id number] Date of Birth/Sex: Treating RN: 1956-04-14 (65 y.o. Janyth Contes Primary Care Mazen Marcin: Henrine Screws Other Clinician: Referring Griffyn Kucinski: Treating Mayola Mcbain/Extender: Rowland Lathe Weeks in Treatment: 0 Active Problems Location of Pain Severity and Description of Pain Patient Has Paino No Site Locations Pain Management and Medication Current Pain  Management: Electronic Signature(s) Signed: 06/14/2021 5:49:01 PM By: Levan Hurst RN, BSN Entered By: Levan Hurst on 06/14/2021 09:53:45 -------------------------------------------------------------------------------- Wound Assessment Details Patient Name: Date of Service: [name], DA V ID M. 06/14/2021 9:30 A M Medical Record Number: [id number] Patient Account Number: [id number] Date of Birth/Sex: Treating RN: May 31, 1956 (65 y.o. Janyth Contes Primary Care Zanaiya Calabria: Henrine Screws Other Clinician: Referring Maelys Kinnick: Treating Zacarias Krauter/Extender: Rowland Lathe Weeks in Treatment: 0 Wound Status Wound Number: 1 Primary Etiology: Open Surgical Wound Wound Location: Penis Wound  Status: Open Wounding Event: Surgical Injury Comorbid History: Sleep Apnea, Hypertension, Received Radiation Date Acquired: 05/15/2021 Weeks Of Treatment: 0 Clustered Wound: No Photos Wound Measurements Length: (cm) 0.5 Width: (cm) 0.3 Depth: (cm) 0.1 Area: (cm) 0.118 Volume: (cm) 0.012 % Reduction in Area: 37.2% % Reduction in Volume: 78.9% Epithelialization: Medium (34-66%) Tunneling: No Undermining: No Wound Description Classification: Full Thickness Without Exposed Support Structures Wound Margin: Epibole Exudate Amount: Medium Exudate Type: Serosanguineous Exudate Color: red, brown Foul Odor After Cleansing: No Slough/Fibrino No Wound Bed Granulation Amount: Large (67-100%) Exposed Structure Granulation Quality: Pink Fascia Exposed: No Necrotic Amount: None Present (0%) Fat Layer (Subcutaneous Tissue) Exposed: Yes Tendon Exposed: No Muscle Exposed: No Joint Exposed: No Bone Exposed: No Electronic Signature(s) Signed: 06/14/2021 5:49:01 PM By: Levan Hurst RN, BSN Entered By: Levan Hurst on 06/14/2021 09:58:17 -------------------------------------------------------------------------------- Grant Details Patient Name: Date of Service: [name], DA  V ID M. 06/14/2021 9:30 A M Medical Record Number: [id number] Patient Account Number: [id number] Date of Birth/Sex: Treating RN: March 18, 1956 (65 y.o. Janyth Contes Primary Care Arlyss Weathersby: Henrine Screws Other Clinician: Referring Briseis Aguilera: Treating Arta Stump/Extender: Rowland Lathe Weeks in Treatment: 0 Vital Signs Time Taken: 09:53 Temperature (F): 97.9 Height (in): 72 Pulse (bpm): 71 Weight (lbs): 205 Respiratory Rate (breaths/min): 16 Body Mass Index (BMI): 27.8 Blood Pressure (mmHg): 128/88 Reference Range: 80 - 120 mg / dl Electronic Signature(s) Signed: 06/14/2021 5:49:01 PM By: Levan Hurst RN, BSN Entered By: Levan Hurst on 06/14/2021 09:53:41

## 2021-06-14 NOTE — Progress Notes (Signed)
TODERICK, ZIMAN ([id number]) Visit Report for 06/14/2021 HPI Details Patient Name: Date of Service: Frazier [place] ID M. 06/14/2021 9:30 A M Medical Record Number: [id number] Patient Account Number: [id number] Date of Birth/Sex: Treating RN: Feb 14, 1956 (65 y.o. Janyth Contes Primary Care Provider: Henrine Screws Other Clinician: Referring Provider: Treating Provider/Extender: Rowland Lathe Weeks in Treatment: 0 History of Present Illness HPI Description: ADMISSION 06/08/2021 This is a 65 year old relatively well man. He has a history of prostate cancer with hormonal therapy and radiation starting in 2017. He had a radical retropubic prostatectomy with salvage radiotherapy and a history of a ureteral stone. He then underwent placement of a Coloplast 3 piece penile prosthesis in September 2021 unfortunately he developed gross hematuria. Cystoscopy showed erosion of the reservoir into his bladder on the right anterior wall. He was admitted to hospital for excision of prosthesis closure of the bladder. This was done in February of this year. He recovered without incident. In May 2022 he was taken back to surgery and was implanted with a new 75 cc reservoir unfortunately developing signs of infection of the implant and he was referred to [facility] urology he went to surgery for prosthesis removal and had a mulcacy salvage procedure. The patient tells me he had 3 antibiotics although he is not sure which ones. He has completed antibiotics. He has an incision on the ventral aspect of his penis this dehisced about 2 weeks ago. Dr. Diona Fanti actually sent me a secure text message to see if we could work him into the clinic Past medical history includes radiation and radical prostatectomy for prostate CA in 2017. 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 He came in here with a fairly deep dehiscence wound  and initial surgical site. There was undermining. We used endoform he is changing this himself every second day. T oday this is much better looking more superficial with no undermining. No evidence of infection. 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 06/14/2021 9:30 A M Medical Record Number: [id number] Patient Account Number: [id number] Date of Birth/Sex: Treating RN: [birth year] (65 y.o. Janyth Contes Primary Care Provider: Henrine Screws Other Clinician: Referring Provider: Treating Provider/Extender: Rowland Lathe Weeks in Treatment: 0 Constitutional Sitting or standing Blood Pressure is within target range for patient.. Pulse regular and within target range for patient.Marland Kitchen Respirations regular, non-labored and within target range.. Temperature is normal and within the target range for the patient.Marland Kitchen Appears in no distress. Notes Wound exam; ventral aspect of his penis. In the middle of his surgical incision. Much better in terms of undermining and much better in terms of depth. Everything looks healthy here. Quite a marked improvement since last week Electronic Signature(s) Signed: 06/14/2021 3:40:46 PM By: Linton Ham MD Entered By: Linton Ham on 06/14/2021 10:08:10 -------------------------------------------------------------------------------- Physician Orders Details Patient Name: Date of Service: [name], DA V ID M. 06/14/2021 9:30 A M Medical Record Number: [id number] Patient Account Number: [id number] Date of Birth/Sex: Treating RN: 05-11-1956 (65 y.o. 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 - with Dr. Dellia Nims Return A Bathing/ Shower/ Hygiene May shower and wash wound with soap and water. Wound Treatment Wound #1 - Penis Prim Dressing: Endoform 2x2 in 1 x Per Day/15 Days ary Discharge Instructions: Moisten with saline Secondary Dressing: Woven Gauze Sponges 2x2 in 1 x Per Day/15 Days Discharge Instructions: Apply over primary dressing as directed. Secured With: Child psychotherapist, Sterile 2x75 (in/in) 1 x Per Day/15 Days Discharge Instructions: Secure with stretch gauze or bandaid Electronic Signature(s) Signed: 06/14/2021 3:40:46 PM By: Linton Ham MD Signed: 06/14/2021 5:49:01 PM By: Levan Hurst RN, BSN Entered By: Levan Hurst on 06/14/2021 10:04:17 -------------------------------------------------------------------------------- Problem List Details Patient Name: Date of Service: [name], DA V ID M. 06/14/2021 9:30 A M Medical Record Number: [id number] Patient Account Number: [id number] Date of Birth/Sex: Treating RN: Apr 01, 1956 (65 y.o. 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 06/14/2021 9:30 A M Medical Record Number: [id number] Patient Account Number: [id number] Date of Birth/Sex: Treating RN: 08-Jun-1956 (65 y.o. Janyth Contes Primary Care Provider: Henrine Screws Other Clinician: Referring Provider: Treating Provider/Extender: Rowland Lathe Weeks in Treatment: 0 Subjective History of Present Illness (HPI) ADMISSION 06/08/2021 This is a 65 year old relatively well man. He has a history of prostate cancer with hormonal therapy and radiation starting in 2017. He had a radical retropubic prostatectomy with salvage radiotherapy and a history of a ureteral stone. He then underwent placement of a Coloplast 3 piece penile prosthesis in September 2021 unfortunately he developed gross hematuria. Cystoscopy showed erosion of the reservoir into his bladder on the right anterior wall. He was admitted to hospital for excision of prosthesis closure of the bladder. This was done in February of this year. He recovered without incident. In May 2022 he was taken back to surgery and was implanted with a new 75 cc reservoir unfortunately developing signs of infection of the implant and he was referred to [facility] urology he went to surgery for prosthesis removal and had a mulcacy salvage procedure. The patient tells me he had 3 antibiotics although he is not sure which ones. He has completed antibiotics. He has an incision on the ventral aspect of his penis this dehisced about 2 weeks ago. Dr. Diona Fanti actually sent me a secure text message to see if we could work him into the clinic Past medical history includes radiation and radical prostatectomy for prostate CA in 2017.  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 He came in here with a fairly deep dehiscence wound and initial surgical site. There was undermining. We used endoform he is changing this himself every second day. T oday this is much better  looking more superficial with no undermining. No evidence of infection. He is leaving for a 10-day vacation to [place] tomorrow Objective Constitutional Sitting or standing Blood Pressure is within target range for patient.. Pulse regular and within target range for patient.Marland Kitchen Respirations regular, non-labored and within target range.. Temperature is normal and within the target range for the patient.Marland Kitchen Appears in no distress. Vitals Time Taken: 9:53 AM, Height: 72 in, Weight: 205 lbs, BMI: 27.8, Temperature: 97.9 F, Pulse: 71 bpm, Respiratory Rate: 16 breaths/min, Blood Pressure: 128/88 mmHg. General Notes: Wound exam; ventral aspect of his penis. In the middle of his surgical incision. Much better in terms of undermining and much better in terms of depth. Everything looks healthy here. Quite a marked improvement since last week Integumentary (Hair, Skin) Wound #1 status is Open. Original cause of wound was Surgical Injury. The date acquired was: 05/15/2021. The wound is located on the Penis. The wound measures 0.5cm length x 0.3cm width x 0.1cm depth; 0.118cm^2 area and 0.012cm^3 volume. There is Fat Layer (Subcutaneous Tissue) exposed. There is no tunneling or undermining noted. There is a medium amount of serosanguineous drainage noted. The wound margin is epibole. There is large (67-100%) pink granulation within the wound bed. There is no necrotic tissue within the wound bed. 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 - with Dr. Dellia Nims Bathing/ Shower/ Hygiene: May shower and wash wound with soap and water. WOUND #1: - Penis Wound Laterality: Prim Dressing: Endoform 2x2 in 1 x Per Day/15 Days ary Discharge Instructions: Moisten with saline Secondary Dressing: Woven Gauze Sponges 2x2 in 1 x Per Day/15 Days Discharge Instructions: Apply over primary dressing as directed. Secured With: Child psychotherapist, Sterile 2x75 (in/in) 1 x Per Day/15 Days Discharge Instructions: Secure with stretch gauze or bandaid 1. Quite a remarkable improvement in 1 week. 2. There is no undermining. 3. Surface of the wound really looks quite healthy. No evidence of concomitant infection. 4. Continue with the same dressing which is endoform 5. We will have another look at this in 2 weeks Electronic Signature(s) Signed: 06/14/2021 3:40:46 PM By: Linton Ham MD Entered By: Linton Ham on 06/14/2021 10:09:10 -------------------------------------------------------------------------------- SuperBill Details Patient Name: Date of Service: [name], DA V ID M. 06/14/2021 Medical Record Number: [id number] Patient Account Number: [id number] Date of Birth/Sex: Treating RN: Apr 20, 1956 (65 y.o. 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

## 2021-06-27 ENCOUNTER — Encounter (HOSPITAL_BASED_OUTPATIENT_CLINIC_OR_DEPARTMENT_OTHER): Payer: BC Managed Care – PPO | Admitting: Internal Medicine

## 2021-06-27 ENCOUNTER — Other Ambulatory Visit: Payer: Self-pay

## 2021-06-27 DIAGNOSIS — Z8546 Personal history of malignant neoplasm of prostate: Secondary | ICD-10-CM | POA: Diagnosis not present

## 2021-06-27 DIAGNOSIS — T8361XA Infection and inflammatory reaction due to implanted penile prosthesis, initial encounter: Secondary | ICD-10-CM | POA: Diagnosis not present

## 2021-06-27 DIAGNOSIS — Z923 Personal history of irradiation: Secondary | ICD-10-CM | POA: Diagnosis not present

## 2021-06-27 DIAGNOSIS — L98498 Non-pressure chronic ulcer of skin of other sites with other specified severity: Secondary | ICD-10-CM | POA: Diagnosis not present

## 2021-06-27 NOTE — Progress Notes (Signed)
Kevin [name] ([id number]) Visit Report for 06/27/2021 HPI Details Patient Name: Date of Service: Kevin [name] ID M. 06/27/2021 9:30 A M Medical Record Number: [id number] Patient Account Number: [id number] Date of Birth/Sex: Treating RN: December 31, 1955 (65 y.o. Erie Noe Primary Care Provider: Henrine Screws Other Clinician: Referring Provider: Treating Provider/Extender: Rowland Lathe Weeks in Treatment: 2 History of Present Illness HPI Description: ADMISSION 06/08/2021 This is a 65 year old relatively well man. He has a history of prostate cancer with hormonal therapy and radiation starting in 2017. He had a radical retropubic prostatectomy with salvage radiotherapy and a history of a ureteral stone. He then underwent placement of a Coloplast 3 piece penile prosthesis in September 2021 unfortunately he developed gross hematuria. Cystoscopy showed erosion of the reservoir into his bladder on the right anterior wall. He was admitted to hospital for excision of prosthesis closure of the bladder. This was done in February of this year. He recovered without incident. In May 2022 he was taken back to surgery and was implanted with a new 75 cc reservoir unfortunately developing signs of infection of the implant and he was referred to [facility] urology he went to surgery for prosthesis removal and had a mulcacy salvage procedure. The patient tells me he had 3 antibiotics although he is not sure which ones. He has completed antibiotics. He has an incision on the ventral aspect of his penis this dehisced about 2 weeks ago. Dr. Diona Fanti actually sent me a secure text message to see if we could work him into the clinic Past medical history includes radiation and radical prostatectomy for prostate CA in 2017. 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 He came in here with a fairly deep dehiscence  wound and initial surgical site. There was undermining. We used endoform he is changing this himself every second day. T oday this is much better looking more superficial with no undermining. No evidence of infection. He is leaving for a 10-day vacation to [place] tomorrow 9/20; surgical wound on the ventral aspect of his penis. This is totally closed. We used endoform Electronic Signature(s) Signed: 06/27/2021 4:30:53 PM By: Linton Ham MD Entered By: Linton Ham on 06/27/2021 09:59:28 -------------------------------------------------------------------------------- Physical Exam Details Patient Name: Date of Service: Kevin [name], DA V ID M. 06/27/2021 9:30 A M Medical Record Number: [id number] Patient Account Number: [id number] Date of Birth/Sex: Treating RN: Mar 13, 1956 (65 y.o. Erie Noe Primary Care Provider: Henrine Screws Other Clinician: Referring Provider: Treating Provider/Extender: Rowland Lathe Weeks in Treatment: 2 Constitutional Sitting or standing Blood Pressure is within target range for patient.. Pulse regular and within target range for patient.Marland Kitchen Respirations regular, non-labored and within target range.. Temperature is normal and within the target range for the patient.Marland Kitchen Appears in no distress. Notes Wound exam; ventral aspect of his penis is totally closed. This was at the site of his surgical incision there is nothing threatening here. Electronic Signature(s) Signed: 06/27/2021 4:30:53 PM By: Linton Ham MD Entered By: Linton Ham on 06/27/2021 10:00:09 -------------------------------------------------------------------------------- Physician Orders Details Patient Name: Date of Service: Kevin [name], DA V ID M. 06/27/2021 9:30 A M Medical Record Number: [id number] Patient Account Number: [id number] Date of Birth/Sex: Treating RN: [birth year] (65 y.o. Kevin [name], Lauren Primary Care Provider: Henrine Screws Other  Clinician: Referring Provider: Treating Provider/Extender: Rowland Lathe Weeks in Treatment: 2 Verbal / Phone  Orders: No Diagnosis Coding Discharge From [facility] Services Discharge from [place] Signature(s) Signed: 06/27/2021 4:30:53 PM By: Linton Ham MD Signed: 06/27/2021 6:09:57 PM By: Rhae Hammock RN Entered By: Rhae Hammock on 06/27/2021 09:56:52 -------------------------------------------------------------------------------- Problem List Details Patient Name: Date of Service: Kevin [name], DA V ID M. 06/27/2021 9:30 A M Medical Record Number: [id number] Patient Account Number: [id number] Date of Birth/Sex: Treating RN: 24-Jul-1956 (65 y.o. 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 06/27/2021 9:30 A M Medical Record Number: [id number] Patient Account Number: [id number] Date of Birth/Sex: Treating RN: 1956-03-14 (65 y.o. Erie Noe Primary Care Provider: Henrine Screws Other Clinician: Referring Provider: Treating Provider/Extender: Rowland Lathe Weeks in Treatment: 2 Subjective History of Present Illness (HPI) ADMISSION 06/08/2021 This is  a 65 year old relatively well man. He has a history of prostate cancer with hormonal therapy and radiation starting in 2017. He had a radical retropubic prostatectomy with salvage radiotherapy and a history of a ureteral stone. He then underwent placement of a Coloplast 3 piece penile prosthesis in September 2021 unfortunately he developed gross hematuria. Cystoscopy showed erosion of the reservoir into his bladder on the right anterior wall. He was admitted to hospital for excision of prosthesis closure of the bladder. This was done in February of this year. He recovered without incident. In May 2022 he was taken back to surgery and was implanted with a new 75 cc reservoir unfortunately developing signs of infection of the implant and he was referred to [facility] urology he went to surgery for prosthesis removal and had a mulcacy salvage procedure. The patient tells me he had 3 antibiotics although he is not sure which ones. He has completed antibiotics. He has an incision on the ventral aspect of his penis this dehisced about 2 weeks ago. Dr. Diona Fanti actually sent me a secure text message to see if we could work him into the clinic Past medical history includes radiation and radical prostatectomy for prostate CA in 2017. 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 He came in here with a fairly deep dehiscence wound and initial surgical site. There was undermining. We used endoform he is changing this himself every second day. T oday this is much better looking more superficial with no undermining. No evidence of infection. He is leaving for a 10-day vacation to [place] tomorrow 9/20; surgical wound on the ventral aspect of his penis. This is totally closed. We used endoform Objective Constitutional Sitting or standing Blood Pressure is within target range for patient.. Pulse regular and within target range for patient.Marland Kitchen  Respirations regular, non-labored and within target range.. Temperature is normal and within the target range for the patient.Marland Kitchen Appears in no distress. Vitals Time Taken: 9:50 AM, Height: 72 in, Weight: 205 lbs, BMI: 27.8, Temperature: 98.7 F, Pulse: 74 bpm, Respiratory Rate: 17 breaths/min, Blood Pressure: 144/74  mmHg. General Notes: Wound exam; ventral aspect of his penis is totally closed. This was at the site of his surgical incision there is nothing threatening here. Integumentary (Hair, Skin) Wound #1 status is Healed - Epithelialized. Original cause of wound was Surgical Injury. The date acquired was: 05/15/2021. The wound has been in treatment 2 weeks. The wound is located on the Penis. The wound measures 0cm length x 0cm width x 0cm depth; 0cm^2 area and 0cm^3 volume. There is a medium amount of serosanguineous drainage noted. 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 The patient can be discharged from the wound care center 2. He is following up with Dr. Diona Fanti in urology Electronic Signature(s) Signed: 06/27/2021 4:30:53 PM By: Linton Ham MD Entered By: Linton Ham on 06/27/2021 10:00:40 -------------------------------------------------------------------------------- SuperBill Details Patient Name: Date of Service: Kevin [name], DA V ID M. 06/27/2021 Medical Record Number: [id number] Patient Account Number: [id number] Date of Birth/Sex: Treating RN: [birth year] (65 y.o. 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, subsequent encounter Facility Procedures CPT4 Code: [id number] Description: [phone] - WOUND CARE VISIT-LEV 2 EST PT Modifier: Quantity: 1 Physician Procedures : CPT4 Code Description Modifier [id number] - WC PHYS LEVEL 2 - EST PT ICD-10 Diagnosis Description L98.498 Non-pressure chronic ulcer of skin of other sites with other specified severity T83.61XD Infection and inflammatory reaction due to implanted  penile prosthesis, subsequent encounter Quantity: 1 Electronic Signature(s) Signed: 06/27/2021 4:30:53 PM By: Linton Ham MD Signed: 06/27/2021 6:09:57 PM By: Rhae Hammock RN Entered By: Rhae Hammock on 06/27/2021 11:09:37

## 2021-06-27 NOTE — Progress Notes (Signed)
Kevin, [name] ([id number]) Visit Report for 06/27/2021 Arrival Information Details Patient Name: Date of Service: [name], [name] 06/27/2021 9:30 A [name] Medical Record Number: [id number] Patient Account Number: [id number] Date of Birth/Sex: Treating RN: [birth year] (65 y.o. Burnadette Pop, Lauren Primary Care Urijah Arko: Henrine Screws Other Clinician: Referring Dayveon Halley: Treating Wellington Winegarden/Extender: Rowland Lathe Weeks in Treatment: 2 Visit Information History Since Last Visit Added or deleted any medications: No Patient Arrived: Ambulatory Any new allergies or adverse reactions: No Arrival Time: 09:49 Had a fall or experienced change in No Accompanied By: self activities of daily living that may affect Transfer Assistance: None risk of falls: Patient Identification Verified: Yes Signs or symptoms of abuse/neglect since last visito No Secondary Verification Process Completed: Yes Hospitalized since last visit: No Patient Requires Transmission-Based Precautions: No Implantable device outside of the clinic excluding No Patient Has Alerts: No cellular tissue based products placed in the center since last visit: Has Dressing in Place as Prescribed: Yes Pain Present Now: No Electronic Signature(s) Signed: 06/27/2021 6:09:57 PM By: Rhae Hammock RN Entered By: Rhae Hammock on 06/27/2021 09:50:17 -------------------------------------------------------------------------------- Clinic Level of Care Assessment Details Patient Name: Date of Service: Kevin [name], Shaune Pascal ID [name]. 06/27/2021 9:30 A [name] Medical Record Number: [id number] Patient Account Number: [id number] Date of Birth/Sex: Treating RN: 20-Dec-1955 (65 y.o. Burnadette Pop, [place] Primary Care Ly Bacchi: Henrine Screws Other Clinician: Referring Naji Mehringer: Treating Shaylen Nephew/Extender: Rowland Lathe Weeks in Treatment: 2 Clinic Level of Care Assessment Items TOOL 4 Quantity Score X- 1  0 Use when only an EandM is performed on FOLLOW-UP visit ASSESSMENTS - Nursing Assessment / Reassessment X- 1 10 Reassessment of Co-morbidities (includes updates in patient status) X- 1 5 Reassessment of Adherence to Treatment Plan ASSESSMENTS - Wound and Skin A ssessment / Reassessment X - Simple Wound Assessment / Reassessment - one wound 1 5 []  - 0 Complex Wound Assessment / Reassessment - multiple wounds []  - 0 Dermatologic / Skin Assessment (not related to wound area) ASSESSMENTS - Focused Assessment []  - 0 Circumferential Edema Measurements - multi extremities []  - 0 Nutritional Assessment / Counseling / Intervention []  - 0 Lower Extremity Assessment (monofilament, tuning fork, pulses) []  - 0 Peripheral Arterial Disease Assessment (using hand held doppler) ASSESSMENTS - Ostomy and/or Continence Assessment and Care []  - 0 Incontinence Assessment and Management []  - 0 Ostomy Care Assessment and Management (repouching, etc.) PROCESS - Coordination of Care X - Simple Patient / Family Education for ongoing care 1 15 []  - 0 Complex (extensive) Patient / Family Education for ongoing care []  - 0 Staff obtains Programmer, systems, Records, T Results / Process Orders est []  - 0 Staff telephones HHA, Nursing Homes / Clarify orders / etc []  - 0 Routine Transfer to another Facility (non-emergent condition) []  - 0 Routine Hospital Admission (non-emergent condition) []  - 0 New Admissions / Biomedical engineer / Ordering NPWT Apligraf, etc. , []  - 0 Emergency Hospital Admission (emergent condition) X- 1 10 Simple Discharge Coordination []  - 0 Complex (extensive) Discharge Coordination PROCESS - Special Needs []  - 0 Pediatric / Minor Patient Management []  - 0 Isolation Patient Management []  - 0 Hearing / Language / Visual special needs []  - 0 Assessment of Community assistance (transportation, D/C planning, etc.) []  - 0 Additional assistance / Altered mentation []  -  0 Support Surface(s) Assessment (bed, cushion, seat, etc.) INTERVENTIONS - Wound Cleansing / Measurement []  - 0 Simple Wound Cleansing - one wound []  -  0 Complex Wound Cleansing - multiple wounds []  - 0 Wound Imaging (photographs - any number of wounds) []  - 0 Wound Tracing (instead of photographs) []  - 0 Simple Wound Measurement - one wound []  - 0 Complex Wound Measurement - multiple wounds INTERVENTIONS - Wound Dressings []  - 0 Small Wound Dressing one or multiple wounds []  - 0 Medium Wound Dressing one or multiple wounds []  - 0 Large Wound Dressing one or multiple wounds []  - 0 Application of Medications - topical []  - 0 Application of Medications - injection INTERVENTIONS - Miscellaneous []  - 0 External ear exam []  - 0 Specimen Collection (cultures, biopsies, blood, body fluids, etc.) []  - 0 Specimen(s) / Culture(s) sent or taken to Lab for analysis []  - 0 Patient Transfer (multiple staff / Civil Service fast streamer / Similar devices) []  - 0 Simple Staple / Suture removal (25 or less) []  - 0 Complex Staple / Suture removal (26 or more) []  - 0 Hypo / Hyperglycemic Management (close monitor of Blood Glucose) []  - 0 Ankle / Brachial Index (ABI) - do not check if billed separately X- 1 5 Vital Signs Has the patient been seen at the hospital within the last three years: Yes Total Score: 50 Level Of Care: New/Established - Level 2 Electronic Signature(s) Signed: 06/27/2021 6:09:57 PM By: Rhae Hammock RN Entered By: Rhae Hammock on 06/27/2021 11:09:15 -------------------------------------------------------------------------------- Encounter Discharge Information Details Patient Name: Date of Service: Kevin [name], Kevin [name]. 06/27/2021 9:30 A [name] Medical Record Number: [id number] Patient Account Number: [id number] Date of Birth/Sex: Treating RN: [birth year] (65 y.o. Burnadette Pop, Lauren Primary Care Jams Trickett: Henrine Screws Other Clinician: Referring Suzette Flagler: Treating  Asenath Balash/Extender: Rowland Lathe Weeks in Treatment: 2 Encounter Discharge Information Items Discharge Condition: Stable Ambulatory Status: Ambulatory Discharge Destination: Home Transportation: Private Auto Accompanied By: self Schedule Follow-up Appointment: Yes Clinical Summary of Care: Patient Declined Electronic Signature(s) Signed: 06/27/2021 6:09:57 PM By: Rhae Hammock RN Entered By: Rhae Hammock on 06/27/2021 11:10:44 -------------------------------------------------------------------------------- Lower Extremity Assessment Details Patient Name: Date of Service: Kevin [name], Kevin [name]. 06/27/2021 9:30 A [name] Medical Record Number: [id number] Patient Account Number: [id number] Date of Birth/Sex: Treating RN: [birth year] (65 y.o. Burnadette Pop, Lauren Primary Care Samika Vetsch: Henrine Screws Other Clinician: Referring Kimarion Chery: Treating Wilborn Membreno/Extender: Rowland Lathe Weeks in Treatment: 2 Electronic Signature(s) Signed: 06/27/2021 6:09:57 PM By: Rhae Hammock RN Entered By: Rhae Hammock on 06/27/2021 09:50:46 -------------------------------------------------------------------------------- Multi Wound Chart Details Patient Name: Date of Service: Kevin [name], Kevin [name]. 06/27/2021 9:30 A [name] Medical Record Number: [id number] Patient Account Number: [id number] Date of Birth/Sex: Treating RN: [birth year] (65 y.o. Burnadette Pop, Lauren Primary Care Dilyn Osoria: Henrine Screws Other Clinician: Referring Fatemah Pourciau: Treating Merlon Alcorta/Extender: Rowland Lathe Weeks in Treatment: 2 Vital Signs Height(in): 72 Pulse(bpm): 74 Weight(lbs): 205 Blood Pressure(mmHg): 144/74 Body Mass Index(BMI): 28 Temperature(F): 98.7 Respiratory Rate(breaths/min): 17 Photos: [1:No Photos Penis] [N/A:N/A N/A] Wound Location: [1:Surgical Injury] [N/A:N/A] Wounding Event: [1:Open Surgical Wound] [N/A:N/A] Primary Etiology:  [1:05/15/2021] [N/A:N/A] Date Acquired: [1:2] [N/A:N/A] Weeks of Treatment: [1:Healed - Epithelialized] [N/A:N/A] Wound Status: [1:0x0x0] [N/A:N/A] Measurements L x W x D (cm) [1:0] [N/A:N/A] A (cm) : rea [1:0] [N/A:N/A] Volume (cm) : [1:100.00%] [N/A:N/A] % Reduction in A rea: [1:100.00%] [N/A:N/A] % Reduction in Volume: [1:Full Thickness Without Exposed] [N/A:N/A] Classification: [1:Support Structures Medium] [N/A:N/A] Exudate Amount: [1:Serosanguineous] [N/A:N/A] Exudate Type: [1:red, brown] [N/A:N/A] Treatment Notes Electronic Signature(s) Signed: 06/27/2021 4:30:53 PM By: Linton Ham MD Signed: 06/27/2021  6:09:57 PM By: Rhae Hammock RN Entered By: Linton Ham on 06/27/2021 09:59:02 -------------------------------------------------------------------------------- Multi-Disciplinary Care Plan Details Patient Name: Date of Service: Kevin [name], Kevin [name]. 06/27/2021 9:30 A [name] Medical Record Number: [id number] Patient Account Number: [id number] Date of Birth/Sex: Treating RN: 1956/07/06 (65 y.o. Burnadette Pop, Lauren Primary Care Alma Muegge: Henrine Screws Other Clinician: Referring Siarah Deleo: Treating Massimiliano Rohleder/Extender: Rowland Lathe Weeks in Treatment: 2 Multidisciplinary Care Plan reviewed with physician Active Inactive Electronic Signature(s) Signed: 06/27/2021 6:09:57 PM By: Rhae Hammock RN Entered By: Rhae Hammock on 06/27/2021 09:57:07 -------------------------------------------------------------------------------- Pain Assessment Details Patient Name: Date of Service: Kevin [name], Kevin [name]. 06/27/2021 9:30 A [name] Medical Record Number: [id number] Patient Account Number: [id number] Date of Birth/Sex: Treating RN: 1955/12/09 (65 y.o. Burnadette Pop, Lauren Primary Care Georgina Krist: Henrine Screws Other Clinician: Referring Indalecio Malmstrom: Treating Eaven Schwager/Extender: Rowland Lathe Weeks in Treatment: 2 Active  Problems Location of Pain Severity and Description of Pain Patient Has Paino No Site Locations Pain Management and Medication Current Pain Management: Electronic Signature(s) Signed: 06/27/2021 6:09:57 PM By: Rhae Hammock RN Entered By: Rhae Hammock on 06/27/2021 09:50:39 -------------------------------------------------------------------------------- Patient/Caregiver Education Details Patient Name: Date of Service: Kevin [name], Shaune Pascal ID [name]. 9/20/2022andnbsp9:30 A [name] Medical Record Number: [id number] Patient Account Number: [id number] Date of Birth/Gender: Treating RN: January 06, 1956 (65 y.o. Erie Noe Primary Care Physician: Henrine Screws Other Clinician: Referring Physician: Treating Physician/Extender: Earl Many in Treatment: 2 Education Assessment Education Provided To: Patient Education Topics Provided Electronic Signature(s) Signed: 06/27/2021 6:09:57 PM By: Rhae Hammock RN Entered By: Rhae Hammock on 06/27/2021 09:57:19 -------------------------------------------------------------------------------- Wound Assessment Details Patient Name: Date of Service: Kevin [name], Kevin [name]. 06/27/2021 9:30 A [name] Medical Record Number: [id number] Patient Account Number: [id number] Date of Birth/Sex: Treating RN: 16-Jul-1956 (66 y.o. Burnadette Pop, Lauren Primary Care Leonard Hendler: Henrine Screws Other Clinician: Referring Jenin Birdsall: Treating Gisele Pack/Extender: Rowland Lathe Weeks in Treatment: 2 Wound Status Wound Number: 1 Primary Etiology: Open Surgical Wound Wound Location: Penis Wound Status: Healed - Epithelialized Wounding Event: Surgical Injury Date Acquired: 05/15/2021 Weeks Of Treatment: 2 Clustered Wound: No Wound Measurements Length: (cm) Width: (cm) Depth: (cm) Area: (cm) Volume: (cm) 0 % Reduction in Area: 100% 0 % Reduction in Volume: 100% 0 0 0 Wound Description Classification:  Full Thickness Without Exposed Support Structu Exudate Amount: Medium Exudate Type: Serosanguineous Exudate Color: red, brown res Treatment Notes Wound #1 (Penis) Cleanser Peri-Wound Care Topical Primary Dressing Secondary Dressing Secured With Compression Wrap Compression Stockings Add-Ons Electronic Signature(s) Signed: 06/27/2021 6:09:57 PM By: Rhae Hammock RN Entered By: Rhae Hammock on 06/27/2021 09:53:00 -------------------------------------------------------------------------------- Vitals Details Patient Name: Date of Service: Kevin [name], Kevin [name]. 06/27/2021 9:30 A [name] Medical Record Number: [id number] Patient Account Number: [id number] Date of Birth/Sex: Treating RN: 08/22/1956 (65 y.o. Burnadette Pop, Lauren Primary Care Kaylan Friedmann: Henrine Screws Other Clinician: Referring Leonilda Cozby: Treating Shalae Belmonte/Extender: Rowland Lathe Weeks in Treatment: 2 Vital Signs Time Taken: 09:50 Temperature (F): 98.7 Height (in): 72 Pulse (bpm): 74 Weight (lbs): 205 Respiratory Rate (breaths/min): 17 Body Mass Index (BMI): 27.8 Blood Pressure (mmHg): 144/74 Reference Range: 80 - 120 mg / dl Electronic Signature(s) Signed: 06/27/2021 6:09:57 PM By: Rhae Hammock RN Entered By: Rhae Hammock on 06/27/2021 09:50:33

## 2021-07-05 DIAGNOSIS — C61 Malignant neoplasm of prostate: Secondary | ICD-10-CM | POA: Diagnosis not present

## 2021-07-26 DIAGNOSIS — C61 Malignant neoplasm of prostate: Secondary | ICD-10-CM | POA: Diagnosis not present

## 2021-08-08 DIAGNOSIS — Z79899 Other long term (current) drug therapy: Secondary | ICD-10-CM | POA: Diagnosis not present

## 2021-08-08 DIAGNOSIS — C8400 Mycosis fungoides, unspecified site: Secondary | ICD-10-CM | POA: Diagnosis not present

## 2021-09-19 DIAGNOSIS — D485 Neoplasm of uncertain behavior of skin: Secondary | ICD-10-CM | POA: Diagnosis not present

## 2021-09-19 DIAGNOSIS — L821 Other seborrheic keratosis: Secondary | ICD-10-CM | POA: Diagnosis not present

## 2021-09-19 DIAGNOSIS — C8400 Mycosis fungoides, unspecified site: Secondary | ICD-10-CM | POA: Diagnosis not present

## 2021-09-19 DIAGNOSIS — D0359 Melanoma in situ of other part of trunk: Secondary | ICD-10-CM | POA: Diagnosis not present

## 2021-09-19 DIAGNOSIS — L814 Other melanin hyperpigmentation: Secondary | ICD-10-CM | POA: Diagnosis not present

## 2021-09-19 DIAGNOSIS — D1801 Hemangioma of skin and subcutaneous tissue: Secondary | ICD-10-CM | POA: Diagnosis not present

## 2021-09-19 DIAGNOSIS — D034 Melanoma in situ of scalp and neck: Secondary | ICD-10-CM | POA: Diagnosis not present

## 2021-09-20 DIAGNOSIS — G4733 Obstructive sleep apnea (adult) (pediatric): Secondary | ICD-10-CM | POA: Diagnosis not present

## 2021-09-26 DIAGNOSIS — C61 Malignant neoplasm of prostate: Secondary | ICD-10-CM | POA: Diagnosis not present

## 2021-10-21 DIAGNOSIS — G4733 Obstructive sleep apnea (adult) (pediatric): Secondary | ICD-10-CM | POA: Diagnosis not present

## 2021-10-26 DIAGNOSIS — H2513 Age-related nuclear cataract, bilateral: Secondary | ICD-10-CM | POA: Diagnosis not present

## 2021-10-26 DIAGNOSIS — H5203 Hypermetropia, bilateral: Secondary | ICD-10-CM | POA: Diagnosis not present

## 2021-10-26 DIAGNOSIS — H33302 Unspecified retinal break, left eye: Secondary | ICD-10-CM | POA: Diagnosis not present

## 2021-11-08 DIAGNOSIS — D034 Melanoma in situ of scalp and neck: Secondary | ICD-10-CM | POA: Diagnosis not present

## 2021-11-08 DIAGNOSIS — L988 Other specified disorders of the skin and subcutaneous tissue: Secondary | ICD-10-CM | POA: Diagnosis not present

## 2021-11-13 ENCOUNTER — Other Ambulatory Visit (HOSPITAL_BASED_OUTPATIENT_CLINIC_OR_DEPARTMENT_OTHER): Payer: Self-pay | Admitting: Physician Assistant

## 2021-11-13 ENCOUNTER — Ambulatory Visit (HOSPITAL_BASED_OUTPATIENT_CLINIC_OR_DEPARTMENT_OTHER)
Admission: RE | Admit: 2021-11-13 | Discharge: 2021-11-13 | Disposition: A | Discharge status: Home / Self Care | Payer: BC Managed Care – PPO | Source: Ambulatory Visit | Attending: Physician Assistant | Admitting: Physician Assistant

## 2021-11-13 ENCOUNTER — Other Ambulatory Visit: Payer: Self-pay

## 2021-11-13 DIAGNOSIS — M79661 Pain in right lower leg: Secondary | ICD-10-CM | POA: Insufficient documentation

## 2021-11-13 DIAGNOSIS — Z86718 Personal history of other venous thrombosis and embolism: Secondary | ICD-10-CM | POA: Insufficient documentation

## 2021-11-13 DIAGNOSIS — I8290 Acute embolism and thrombosis of unspecified vein: Secondary | ICD-10-CM

## 2021-11-13 DIAGNOSIS — M7989 Other specified soft tissue disorders: Secondary | ICD-10-CM

## 2021-11-13 DIAGNOSIS — I82431 Acute embolism and thrombosis of right popliteal vein: Secondary | ICD-10-CM | POA: Diagnosis not present

## 2021-11-13 DIAGNOSIS — I82411 Acute embolism and thrombosis of right femoral vein: Secondary | ICD-10-CM | POA: Diagnosis not present

## 2021-11-13 HISTORY — DX: Acute embolism and thrombosis of unspecified vein: I82.90

## 2021-11-15 ENCOUNTER — Telehealth: Payer: Self-pay | Admitting: Hematology and Oncology

## 2021-11-15 NOTE — Telephone Encounter (Signed)
Scheduled appt per 2/7 referral. Spoke to pt who is aware of appt date and time. Pt is aware to arrive 15 mins prior to appt time.  °

## 2021-11-21 DIAGNOSIS — G4733 Obstructive sleep apnea (adult) (pediatric): Secondary | ICD-10-CM | POA: Diagnosis not present

## 2021-11-29 NOTE — Progress Notes (Signed)
[name] NOTE  Patient Care Team: Josetta Huddle, MD as PCP - General (Internal Medicine)  CHIEF COMPLAINTS/PURPOSE OF CONSULTATION:  Newly diagnosed DVT  HISTORY OF PRESENTING ILLNESS:  Kevin [name] 66 y.o. male is here because of recent diagnosis of DVT. [place] on 11/13/2021 showed acute, occlusive DVT of the right femoral and popliteal veins. She presents to the clinic today for initial evaluation and discussion of treatment options.  He is currently on Xarelto and appears to be tolerating it fairly well.  This blood clot happened after he returned from a long distance flight to [facility]. He had 2 other prior history of blood clots in 2008 and possibly 2015.  Both of these episodes were after long distance travel especially when he travels internationally all around the [place].  He also reports that he has 2 brothers who have both had blood clots.  The reason for today's consult is to find out why he has these blood clots as well as to determine how long he needs to remain on blood thinners.  I reviewed her records extensively and collaborated the history with the patient.  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   ctcl cutaneous t cell lymphoma   Prostate cancer ([place]) 2017   Rosacea     SURGICAL HISTORY: Past Surgical History:  Procedure Laterality Date   CHOLECYSTECTOMY  1994 or1995   CYSTOSCOPY W/ RETROGRADES Left 01/20/2017   Procedure: CYSTOSCOPY WITH RETROGRADE PYELOGRAM, LEFT STENT PLACEMENT(STRING OFF);  Surgeon: Raynelle Bring, MD;  Location: [facility];  Service: Urology;  Laterality: Left;   CYSTOSCOPY WITH RETROGRADE PYELOGRAM, URETEROSCOPY AND STENT PLACEMENT Left 01/17/2017   Procedure: CYSTOSCOPY WITH RETROGRADE PYELOGRAM, URETEROSCOPY, HOLMIUM LASER AND  LEFT STENT  PLACEMENT;  Surgeon: Franchot Gallo, MD;  Location: [facility];  Service: Urology;  Laterality: Left;   PENILE PROSTHESIS IMPLANT N/A 06/23/2020   Procedure: PENILE PROTHESIS INFLATABLE;  Surgeon: Franchot Gallo, MD;  Location: [facility];  Service: Urology;  Laterality: N/A;  2 HRS   PENILE PROSTHESIS IMPLANT N/A 11/14/2020   Procedure: Excision of prosthesis resevoir, Closure of bladder defect, Suprapubic tube placement;  Surgeon: Franchot Gallo, MD;  Location: [facility];  Service: Urology;  Laterality: N/A;   PENILE PROSTHESIS IMPLANT N/A 02/23/2021   Procedure: PENILE PROTHESIS RESERVOIR;  Surgeon: Franchot Gallo, MD;  Location: [facility];  Service: Urology;  Laterality: N/A;  [address]  2017   receiving chronic interferon      TONSILLECTOMY  as child   adenoids removed   URETERAL STENT PLACEMENT     wisdom  teeth extraction  1990    SOCIAL HISTORY: Social History   Socioeconomic History   Marital status: Married    Spouse name: Not on file   Number of children: Not on file   Years of education: Not on file   Highest education level: Not on file  Occupational History   Not on file  Tobacco Use   Smoking status: Former    Packs/day: 0.50    Years: 10.00    Pack years: 5.00    Types: Cigarettes    Quit date: 10/08/2014    Years since quitting: 7.1   Smokeless tobacco: Never  Vaping  Use   Vaping Use: Never used  Substance and Sexual Activity   Alcohol use: Yes    Comment:  3  per day   Drug use: Yes    Types: Marijuana    Comment: last marijuana use 2 weeks ago as of 06-17-2020   Sexual activity: Yes  Other Topics Concern   Not on file  Social History Narrative   Patient is a medical supplies salesman. He resides with his wife in [place]. They have two children; one resides in [place] the other in [place].   05-29-18   Unable to ask abuse questions wife with him today.   Social Determinants of  Health   Financial Resource Strain: Not on file  Food Insecurity: Not on file  Transportation Needs: Not on file  Physical Activity: Not on file  Stress: Not on file  Social Connections: Not on file  Intimate Partner Violence: Not on file    FAMILY HISTORY: Family History  Problem Relation Age of Onset   Stroke Father    Hypertension Father    Head & neck cancer Father    Hypertension Mother    Skin cancer Mother    Prostate cancer Paternal Uncle        prostate ca   Colon cancer Maternal Grandmother    Breast cancer Neg Hx     ALLERGIES:  is allergic to lisinopril.  MEDICATIONS:  Current Outpatient Medications  Medication Sig Dispense Refill   amLODipine-valsartan (EXFORGE) 5-160 MG tablet Take 1 tablet by mouth daily.     citalopram (CELEXA) 40 MG tablet Take 40 mg by mouth at bedtime.     milk thistle 175 MG tablet Take 175 mg by mouth in the morning and at bedtime.      peginterferon alfa-2a (PEGASYS) 180 MCG/ML injection Inject 180 mcg into the skin every 7 (seven) days.     Psyllium 52.3 % POWD Take 1 each by mouth daily. metamucil     Red Yeast Rice Extract 600 MG CAPS Take 1 capsule by mouth in the morning and at bedtime.      triamcinolone cream (KENALOG) 0.1 % Apply topically See admin instructions. 2 weeks on 1 week off daily     XARELTO 15 MG TABS tablet Take 15 mg by mouth 2 (two) times daily.     oxyCODONE (ROXICODONE) 5 MG immediate release tablet Take 1 tablet (5 mg total) by mouth every 8 (eight) hours as needed. 10 tablet 0   No current facility-administered medications for this visit.    REVIEW OF SYSTEMS:   Constitutional: Denies fevers, chills or abnormal night sweats   All other systems were reviewed with the patient and are negative.  PHYSICAL EXAMINATION: ECOG PERFORMANCE STATUS: 1 - Symptomatic but completely ambulatory  Vitals:   11/30/21 1310  BP: 132/83  Pulse: 82  Resp: 18  SpO2: 97%   Filed Weights   11/30/21 1310  Weight: 210  lb (95.3 kg)    Extremities: I do not feel any significant swelling or pain or discomfort in the right calf  LABORATORY DATA:  I have reviewed the data as listed Lab Results  Component Value Date   WBC 5.9 11/15/2020   HGB 13.9 02/23/2021   HCT 41.0 02/23/2021   MCV 89.7 11/15/2020   PLT 147 (L) 11/15/2020   Lab Results  Component Value Date   NA 140 02/23/2021   K 4.0 02/23/2021   CL 107 02/23/2021   CO2 20 (L) 11/14/2020  RADIOGRAPHIC STUDIES: I have personally reviewed the radiological reports and agreed with the findings in the report.  ASSESSMENT AND PLAN:  Right leg DVT ([place]) 11/13/2021: Acute occlusive DVT of the right femoral and popliteal veins  History of prostate cancer initially diagnosed Gleason score 9 T1c 03/06/2016 Radical prostatectomy 07/18/2016 at [facility] PSA increased 06/07/2017 ADT initiated 10/21/2017 Zytiga added 02/03/2018 Salvage radiation 2009  History of CTCL: Currently on interferon injections  I discussed with the patient risk factors for blood clots.  Inherited risk factors include: 1. Factor V Leiden mutation 2. Prothrombin gene G20210A 3. Protein S deficiency  4. Protein C deficiency  5. Antithrombin deficiency  Acquired risk factors include: 1. Antiphospholipid antibody syndrome 2. Tobacco use: Not a smoker 3. Obesity does not have any obesity issues 4. Medications   5. Sedentary behavior including postoperative state 6. Foreign bodies in circulation 7.  History of prostate cancer and CTCL 8.  Long distance travel  Workup recommended: Bloodwork to evaluate for the 5 inherited factors mentioned above along with antiphospholipid antibodies. Return to clinic with a MyChart virtual visit in 2 weeks to discuss the results of the tests   All questions were answered. The patient knows to call the clinic with any problems, questions or concerns.   Rulon Eisenmenger, MD, MPH 11/30/2021    I, Thana Ates, am acting as scribe for Nicholas Lose, MD.  I have reviewed the above documentation for accuracy and completeness, and I agree with the above.

## 2021-11-30 ENCOUNTER — Inpatient Hospital Stay: Payer: BC Managed Care – PPO

## 2021-11-30 ENCOUNTER — Inpatient Hospital Stay: Payer: BC Managed Care – PPO | Attending: Hematology and Oncology | Admitting: Hematology and Oncology

## 2021-11-30 ENCOUNTER — Other Ambulatory Visit: Payer: Self-pay

## 2021-11-30 DIAGNOSIS — Z8546 Personal history of malignant neoplasm of prostate: Secondary | ICD-10-CM | POA: Insufficient documentation

## 2021-11-30 DIAGNOSIS — Z7901 Long term (current) use of anticoagulants: Secondary | ICD-10-CM | POA: Insufficient documentation

## 2021-11-30 DIAGNOSIS — I82411 Acute embolism and thrombosis of right femoral vein: Secondary | ICD-10-CM

## 2021-11-30 DIAGNOSIS — C84A0 Cutaneous T-cell lymphoma, unspecified, unspecified site: Secondary | ICD-10-CM | POA: Diagnosis not present

## 2021-11-30 DIAGNOSIS — I82401 Acute embolism and thrombosis of unspecified deep veins of right lower extremity: Secondary | ICD-10-CM | POA: Insufficient documentation

## 2021-11-30 NOTE — Assessment & Plan Note (Signed)
11/13/2021: Acute occlusive DVT of the right femoral and popliteal veins  History of prostate cancer initially diagnosed Gleason score 9 T1c 03/06/2016 Radical prostatectomy 07/18/2016 at [facility] PSA increased 06/07/2017 ADT initiated 10/21/2017 Zytiga added 02/03/2018 Salv[place] radiation 2009  Chronic/recurrent blood clots I discussed with the patient risk factors for blood clots.  Inherited risk factors include: 1. Factor V Leiden mutation 2. Prothrombin gene G20210A 3. Protein S deficiency  4. Protein C deficiency  5. Antithrombin deficiency  Acquired risk factors include: 1. Antiphospholipid antibody syndrome 2. Tobacco use 3. Obesity 4. Medications including oral contraceptives 5. Sedentary behavior including postoperative state 6. Foreign bodies in circulation 7.  Metastatic cancer is also a consideration given his history of prostate cancer.  I would like to obtain CT chest abdomen pelvis and bone scan for further assessment  Workup recommended: Bloodwork to evaluate for the 5 inherited factors mentioned above along with antiphospholipid antibodies. Return to clinic in one to 2 weeks to discuss the results of the tests

## 2021-12-01 ENCOUNTER — Telehealth: Payer: Self-pay | Admitting: Hematology and Oncology

## 2021-12-01 LAB — BETA-2-GLYCOPROTEIN I ABS, IGG/M/A
Beta-2 Glyco I IgG: <9 GPI IgG units (ref 0–20)
Beta-2-Glycoprotein I IgA: <9 GPI IgA units (ref 0–25)
Beta-2-Glycoprotein I IgM: <9 GPI IgM units (ref 0–32)

## 2021-12-01 LAB — PROTEIN C, TOTAL: Protein C, Total: 44 % — ABNORMAL LOW (ref 60–150)

## 2021-12-01 NOTE — Telephone Encounter (Signed)
Scheduled appointment per 02/23 los. Left message with appointment times.

## 2021-12-02 LAB — LUPUS ANTICOAGULANT PANEL
DRVVT: 178.3 s — ABNORMAL HIGH (ref 0.0–47.0)
PTT Lupus Anticoagulant: 51.8 s — ABNORMAL HIGH (ref 0.0–43.5)

## 2021-12-02 LAB — DRVVT CONFIRM: dRVVT Confirm: 2.4 ratio — ABNORMAL HIGH (ref 0.8–1.2)

## 2021-12-02 LAB — ANTITHROMBIN III ANTIGEN: AT III AG PPP IMM-ACNC: 94 % (ref 72–124)

## 2021-12-02 LAB — PTT-LA MIX: PTT-LA Mix: 46 s — ABNORMAL HIGH (ref 0.0–40.5)

## 2021-12-02 LAB — HEXAGONAL PHASE PHOSPHOLIPID: Hexagonal Phase Phospholipid: 7 s (ref 0–11)

## 2021-12-02 LAB — PROTEIN S, TOTAL: Protein S Ag, Total: 149 % (ref 60–150)

## 2021-12-02 LAB — DRVVT MIX: dRVVT Mix: 97.6 s — ABNORMAL HIGH (ref 0.0–40.4)

## 2021-12-04 LAB — CARDIOLIPIN ANTIBODIES, IGG, IGM, IGA
Anticardiolipin IgA: <9 APL U/mL (ref 0–11)
Anticardiolipin IgG: <9 GPL U/mL (ref 0–14)
Anticardiolipin IgM: <9 MPL U/mL (ref 0–12)

## 2021-12-07 LAB — FACTOR 5 LEIDEN

## 2021-12-11 LAB — PROTHROMBIN GENE MUTATION

## 2021-12-19 DIAGNOSIS — G4733 Obstructive sleep apnea (adult) (pediatric): Secondary | ICD-10-CM | POA: Diagnosis not present

## 2021-12-20 NOTE — Progress Notes (Signed)
?HEMATOLOGY-ONCOLOGY [place] VISIT PROGRESS NOTE ? ?I connected with Kevin [name] on 12/22/2021 at 10:45 AM EDT by Mychart video conference and verified that I am speaking with the correct person using two identifiers.  ?I discussed the limitations, risks, security and privacy concerns of performing an evaluation and management service by Webex and the availability of in person appointments.  ?I also discussed with the patient that there may be a patient responsible charge related to this service. The patient expressed understanding and agreed to proceed.  ?Patient's Location: Home ?Physician Location: Clinic ? ?CHIEF COMPLIANT: DVT ? ?INTERVAL HISTORY: Kevin [name] is a 66 y.o. male with above-mentioned history of DVT. He presents to the clinic today via My chart. ?He had extensive blood work for hypercoagulability and is here today to discuss results.  He went to [facility] and had an episode of uncontrollable bowel movement and he is very concerned about it but has had no further issues related to that.  He may have irritable bowel syndrome. ?  ? ?REVIEW OF SYSTEMS:   ?Constitutional: Denies fevers, chills or abnormal weight loss ?  ?All other systems were reviewed with the patient and are negative. ? ?Observations/Objective:  ?There were no vitals filed for this visit. ?There is no height or weight on file to calculate BMI.  ?I have reviewed the data as listed ?CMP Latest Ref Rng & Units 02/23/2021 11/15/2020 11/14/2020  ?Glucose 70 - 99 mg/dL 110(H) - 85  ?BUN 8 - 23 mg/dL 15 - 13  ?Creatinine 0.61 - 1.24 mg/dL 0.90 0.86 1.04  ?Sodium 135 - 145 mmol/L 140 - 141  ?Potassium 3.5 - 5.1 mmol/L 4.0 - 3.8  ?Chloride 98 - 111 mmol/L 107 - 107  ?CO2 22 - 32 mmol/L - - 20(L)  ?Calcium 8.9 - 10.3 mg/dL - - 9.1  ?Total Protein 6.5 - 8.1 g/dL - - 7.3  ?Total Bilirubin 0.3 - 1.2 mg/dL - - 0.9  ?Alkaline Phos 38 - 126 U/L - - 77  ?AST 15 - 41 U/L - - 35  ?ALT 0 - 44 U/L - - 41  ? ? ?Lab Results  ?Component Value Date  ? WBC 5.9  11/15/2020  ? HGB 13.9 02/23/2021  ? HCT 41.0 02/23/2021  ? MCV 89.7 11/15/2020  ? PLT 147 (L) 11/15/2020  ? NEUTROABS 2.7 11/14/2020  ? ? ?  ?Assessment Plan:  ?Right leg DVT ([place]) ?11/13/2021: Acute occlusive DVT of the right femoral and popliteal veins  ?History of prostate cancer initially diagnosed Gleason score 9 T1c 03/06/2016 ?Radical prostatectomy 07/18/2016 at [facility] ?PSA increased 06/07/2017 ?ADT initiated 10/21/2017 ?Zytiga added 02/03/2018 ?Salvage radiation 2009 ?  ?History of CTCL: Currently on interferon injections ? ?Hypercoagulability work-up ?1.  Protein C: 44% (slightly deficient) ?2. protein S and Antithrombin: Normal ?3.  Lupus anticoagulant/antiphospholipid antibodies: Positive for lupus anticoagulant ?4.  Prothrombin gene mutation: Not detected ?5.  Factor V Leiden: Not detected ? ?Discussion: I discussed with the patient that antiphospholipid antibody syndrome requires repeat checkup in 3 months to determine that the antibodies are still present. ?Only repeat positive antibodies indicate antiphospholipid antibody syndrome which would require lifelong anticoagulation. ? ?Recheck labs in 3 months and follow-up after that with a MyChart virtual visit ? ? ? ?I discussed the assessment and treatment plan with the patient. The patient was provided an opportunity to ask questions and all were answered. The patient agreed with the plan and demonstrated an understanding of the instructions. The patient was advised to  call back or seek an in-person evaluation if the symptoms worsen or if the condition fails to improve as anticipated.  ? ?I provided 20 minutes of face-to-face Web Ex time during this encounter.   ? ?Rulon Eisenmenger, MD ?12/22/2021  ?I, Gardiner Coins, am acting as a Education administrator for Dr. Lindi Adie  ? ?I have reviewed the above documentation for accuracy and completeness, and I agree with the above. ? ?

## 2021-12-22 ENCOUNTER — Other Ambulatory Visit: Payer: Self-pay

## 2021-12-22 ENCOUNTER — Inpatient Hospital Stay: Payer: BC Managed Care – PPO | Attending: Hematology and Oncology | Admitting: Hematology and Oncology

## 2021-12-22 DIAGNOSIS — I82411 Acute embolism and thrombosis of right femoral vein: Secondary | ICD-10-CM | POA: Diagnosis not present

## 2021-12-22 DIAGNOSIS — Z8546 Personal history of malignant neoplasm of prostate: Secondary | ICD-10-CM | POA: Insufficient documentation

## 2021-12-22 DIAGNOSIS — Z86718 Personal history of other venous thrombosis and embolism: Secondary | ICD-10-CM | POA: Insufficient documentation

## 2021-12-22 NOTE — Assessment & Plan Note (Signed)
11/13/2021: Acute occlusive DVT of the right femoral and popliteal veins  ?History of prostate cancer initially diagnosed Gleason score 9 T1c 03/06/2016 ?Radical prostatectomy 07/18/2016 at [place] ?PSA increased 06/07/2017 ?ADT initiated 10/21/2017 ?Zytiga added 02/03/2018 ?Salv[place] radiation 2009 ?? ?History of CTCL: Currently on interferon injections ? ?Hypercoagulability work-up ?1.  Protein C: 44% (slightly deficient) ?2. protein S and Antithrombin: Normal ?3.  Lupus anticoagulant/antiphospholipid antibodies: Positive for lupus anticoagulant ?4.  Prothrombin gene mutation: Not detected ?5.  Factor V Leiden: Not detected ? ?Discussion: I discussed with the patient that antiphospholipid antibody syndrome requires repeat checkup in 3 months to determine that the antibodies are still present. ?Only repeat positive antibodies indicate antiphospholipid antibody syndrome which would require lifelong anticoagulation. ? ?Recheck labs in 3 months and follow-up after that with a telephone visit ?

## 2022-01-19 DIAGNOSIS — G4733 Obstructive sleep apnea (adult) (pediatric): Secondary | ICD-10-CM | POA: Diagnosis not present

## 2022-01-22 DIAGNOSIS — C61 Malignant neoplasm of prostate: Secondary | ICD-10-CM | POA: Diagnosis not present

## 2022-01-30 DIAGNOSIS — C61 Malignant neoplasm of prostate: Secondary | ICD-10-CM | POA: Diagnosis not present

## 2022-02-18 DIAGNOSIS — G4733 Obstructive sleep apnea (adult) (pediatric): Secondary | ICD-10-CM | POA: Diagnosis not present

## 2022-03-02 DIAGNOSIS — N5231 Erectile dysfunction following radical prostatectomy: Secondary | ICD-10-CM | POA: Diagnosis not present

## 2022-03-02 DIAGNOSIS — R310 Gross hematuria: Secondary | ICD-10-CM | POA: Diagnosis not present

## 2022-03-02 DIAGNOSIS — N210 Calculus in bladder: Secondary | ICD-10-CM | POA: Diagnosis not present

## 2022-03-09 ENCOUNTER — Other Ambulatory Visit: Payer: Self-pay | Admitting: Urology

## 2022-03-09 ENCOUNTER — Encounter (HOSPITAL_BASED_OUTPATIENT_CLINIC_OR_DEPARTMENT_OTHER): Payer: Self-pay | Admitting: Urology

## 2022-03-09 NOTE — Progress Notes (Signed)
Left message with selita at dr dahlstedt office to send instructions for xarelto for 02-16-2022 surgery to fax number [phone].

## 2022-03-13 ENCOUNTER — Encounter (HOSPITAL_BASED_OUTPATIENT_CLINIC_OR_DEPARTMENT_OTHER): Payer: Self-pay | Admitting: Urology

## 2022-03-13 NOTE — Progress Notes (Signed)
Spoke w/ via phone for pre-op interview---pt Lab needs dos----    ekg, istat           Lab results------[facility] hematology 12-22-2021 epic dr y Lindi Adie epic COVID test -----patient states asymptomatic no test needed Arrive at -------600 am NPO after MN NO Solid Food.  Clear liquids from MN until---500 am Med rec completed Medications to take morning of surgery -----none Diabetic medication -----n/a Patient instructed no nail polish to be worn day of surgery Patient instructed to bring photo id and insurance card day of surgery Patient aware to have Driver (ride ) / caregiver    wife Kevin [name] for 24 hours after surgery  Patient Special Instructions -----pt aware of instructions from dr Diona Fanti to stop xarelto 72 hours before surgery last dose to be 6-03-15-2022, note received from dr dahlstedt via fax and placed on patient chart to stop xarelto 72 hours before surgery Pre-Op special Istructions -----bring cpap mask tubing and machine and leave in car Patient verbalized understanding of instructions that were given at this phone interview. Patient denies shortness of breath, chest pain, fever, cough at this phone interview.

## 2022-03-19 ENCOUNTER — Other Ambulatory Visit: Payer: Self-pay

## 2022-03-19 ENCOUNTER — Ambulatory Visit (HOSPITAL_BASED_OUTPATIENT_CLINIC_OR_DEPARTMENT_OTHER)
Admission: RE | Admit: 2022-03-19 | Discharge: 2022-03-19 | Disposition: A | Discharge status: Home / Self Care | Payer: BC Managed Care – PPO | Attending: Urology | Admitting: Urology

## 2022-03-19 ENCOUNTER — Encounter (HOSPITAL_BASED_OUTPATIENT_CLINIC_OR_DEPARTMENT_OTHER)
Admission: RE | Disposition: A | Discharge status: Home / Self Care | Payer: Self-pay | Source: Home / Self Care | Attending: Urology

## 2022-03-19 ENCOUNTER — Ambulatory Visit (HOSPITAL_BASED_OUTPATIENT_CLINIC_OR_DEPARTMENT_OTHER): Payer: BC Managed Care – PPO | Admitting: Anesthesiology

## 2022-03-19 ENCOUNTER — Encounter (HOSPITAL_BASED_OUTPATIENT_CLINIC_OR_DEPARTMENT_OTHER): Payer: Self-pay | Admitting: Urology

## 2022-03-19 DIAGNOSIS — Z01818 Encounter for other preprocedural examination: Secondary | ICD-10-CM

## 2022-03-19 DIAGNOSIS — Z923 Personal history of irradiation: Secondary | ICD-10-CM | POA: Insufficient documentation

## 2022-03-19 DIAGNOSIS — Z8249 Family history of ischemic heart disease and other diseases of the circulatory system: Secondary | ICD-10-CM | POA: Insufficient documentation

## 2022-03-19 DIAGNOSIS — Z9079 Acquired absence of other genital organ(s): Secondary | ICD-10-CM | POA: Diagnosis not present

## 2022-03-19 DIAGNOSIS — I10 Essential (primary) hypertension: Secondary | ICD-10-CM | POA: Insufficient documentation

## 2022-03-19 DIAGNOSIS — N210 Calculus in bladder: Secondary | ICD-10-CM | POA: Diagnosis not present

## 2022-03-19 DIAGNOSIS — G4733 Obstructive sleep apnea (adult) (pediatric): Secondary | ICD-10-CM | POA: Insufficient documentation

## 2022-03-19 DIAGNOSIS — Z8546 Personal history of malignant neoplasm of prostate: Secondary | ICD-10-CM | POA: Insufficient documentation

## 2022-03-19 DIAGNOSIS — Z87891 Personal history of nicotine dependence: Secondary | ICD-10-CM | POA: Insufficient documentation

## 2022-03-19 HISTORY — PX: CYSTOSCOPY WITH LITHOLAPAXY: SHX1425

## 2022-03-19 HISTORY — PX: HOLMIUM LASER APPLICATION: SHX5852

## 2022-03-19 LAB — POCT I-STAT, CHEM 8
BUN: 14 mg/dL (ref 8–23)
Calcium, Ion: 1.28 mmol/L (ref 1.15–1.40)
Chloride: 106 mmol/L (ref 98–111)
Creatinine, Ser: 0.9 mg/dL (ref 0.61–1.24)
Glucose, Bld: 115 mg/dL — ABNORMAL HIGH (ref 70–99)
HCT: 45 % (ref 39.0–52.0)
Hemoglobin: 15.3 g/dL (ref 13.0–17.0)
Potassium: 3.9 mmol/L (ref 3.5–5.1)
Sodium: 139 mmol/L (ref 135–145)
TCO2: 21 mmol/L — ABNORMAL LOW (ref 22–32)

## 2022-03-19 SURGERY — CYSTOSCOPY, WITH BLADDER CALCULUS LITHOLAPAXY
Anesthesia: General

## 2022-03-19 MED ORDER — PROPOFOL 10 MG/ML IV BOLUS
INTRAVENOUS | Status: AC
  Filled 2022-03-19: qty 20

## 2022-03-19 MED ORDER — FENTANYL CITRATE (PF) 250 MCG/5ML IJ SOLN
INTRAMUSCULAR | Status: DC | PRN
  Administered 2022-03-19 (×2): 50 ug via INTRAVENOUS

## 2022-03-19 MED ORDER — LACTATED RINGERS IV SOLN: INTRAVENOUS | Status: DC

## 2022-03-19 MED ORDER — FENTANYL CITRATE (PF) 100 MCG/2ML IJ SOLN: 25.0000 ug | INTRAMUSCULAR | Status: DC | PRN

## 2022-03-19 MED ORDER — OXYCODONE HCL 5 MG/5ML PO SOLN: 5.0000 mg | Freq: Once | ORAL | Status: DC | PRN

## 2022-03-19 MED ORDER — DEXAMETHASONE SODIUM PHOSPHATE 10 MG/ML IJ SOLN
INTRAMUSCULAR | Status: DC | PRN
  Administered 2022-03-19: 10 mg via INTRAVENOUS

## 2022-03-19 MED ORDER — CEFAZOLIN SODIUM-DEXTROSE 2-4 GM/100ML-% IV SOLN
INTRAVENOUS | Status: AC
  Filled 2022-03-19: qty 100

## 2022-03-19 MED ORDER — FENTANYL CITRATE (PF) 100 MCG/2ML IJ SOLN
INTRAMUSCULAR | Status: AC
  Filled 2022-03-19: qty 2

## 2022-03-19 MED ORDER — CEFAZOLIN SODIUM-DEXTROSE 2-4 GM/100ML-% IV SOLN
2.0000 g | INTRAVENOUS | Status: AC
  Administered 2022-03-19: 2 g via INTRAVENOUS

## 2022-03-19 MED ORDER — STERILE WATER FOR IRRIGATION IR SOLN
Status: DC | PRN
  Administered 2022-03-19: 3000 mL

## 2022-03-19 MED ORDER — LIDOCAINE 2% (20 MG/ML) 5 ML SYRINGE
INTRAMUSCULAR | Status: DC | PRN
  Administered 2022-03-19: 100 mg via INTRAVENOUS

## 2022-03-19 MED ORDER — ONDANSETRON HCL 4 MG/2ML IJ SOLN
INTRAMUSCULAR | Status: DC | PRN
  Administered 2022-03-19: 4 mg via INTRAVENOUS

## 2022-03-19 MED ORDER — EPHEDRINE SULFATE (PRESSORS) 50 MG/ML IJ SOLN
INTRAMUSCULAR | Status: DC | PRN
  Administered 2022-03-19: 5 mg via INTRAVENOUS

## 2022-03-19 MED ORDER — KETOROLAC TROMETHAMINE 30 MG/ML IJ SOLN
30.0000 mg | Freq: Once | INTRAMUSCULAR | Status: DC | PRN
Start: 2022-03-19 — End: 2022-03-19

## 2022-03-19 MED ORDER — MIDAZOLAM HCL 2 MG/2ML IJ SOLN
INTRAMUSCULAR | Status: AC
  Filled 2022-03-19: qty 2

## 2022-03-19 MED ORDER — PHENYLEPHRINE 80 MCG/ML (10ML) SYRINGE FOR IV PUSH (FOR BLOOD PRESSURE SUPPORT)
PREFILLED_SYRINGE | INTRAVENOUS | Status: DC | PRN
  Administered 2022-03-19: 80 ug via INTRAVENOUS

## 2022-03-19 MED ORDER — ONDANSETRON HCL 4 MG/2ML IJ SOLN: 4.0000 mg | Freq: Once | INTRAMUSCULAR | Status: DC | PRN

## 2022-03-19 MED ORDER — OXYBUTYNIN CHLORIDE 5 MG PO TABS: 5.0000 mg | ORAL_TABLET | Freq: Three times a day (TID) | ORAL | 1 refills | Status: DC | PRN

## 2022-03-19 MED ORDER — MIDAZOLAM HCL 2 MG/2ML IJ SOLN
INTRAMUSCULAR | Status: DC | PRN
  Administered 2022-03-19: 2 mg via INTRAVENOUS

## 2022-03-19 MED ORDER — PROPOFOL 10 MG/ML IV BOLUS
INTRAVENOUS | Status: DC | PRN
  Administered 2022-03-19: 100 mg via INTRAVENOUS
  Administered 2022-03-19: 200 mg via INTRAVENOUS

## 2022-03-19 MED ORDER — OXYCODONE HCL 5 MG PO TABS: 5.0000 mg | ORAL_TABLET | Freq: Once | ORAL | Status: DC | PRN

## 2022-03-19 SURGICAL SUPPLY — 23 items
BAG DRAIN URO-CYSTO SKYTR STRL (DRAIN) ×2 IMPLANT
BAG DRN RND TRDRP ANRFLXCHMBR (UROLOGICAL SUPPLIES) ×1
BAG DRN UROCATH (DRAIN) ×1
BAG URINE DRAIN 2000ML AR STRL (UROLOGICAL SUPPLIES) ×1 IMPLANT
CATH FOLEY 2WAY SLVR  5CC 14FR (CATHETERS) ×2
CATH FOLEY 2WAY SLVR 5CC 14FR (CATHETERS) IMPLANT
CLOTH BEACON ORANGE TIMEOUT ST (SAFETY) ×3 IMPLANT
FIBER LASER FLEXIVA 365 (UROLOGICAL SUPPLIES) ×1 IMPLANT
GLOVE BIO SURGEON STRL SZ 6.5 (GLOVE) ×1 IMPLANT
GLOVE BIO SURGEON STRL SZ8 (GLOVE) ×2 IMPLANT
GLOVE BIOGEL PI IND STRL 6.5 (GLOVE) IMPLANT
GLOVE BIOGEL PI INDICATOR 6.5 (GLOVE) ×1
GOWN STRL REUS W/ TWL XL LVL3 (GOWN DISPOSABLE) IMPLANT
GOWN STRL REUS W/TWL XL LVL3 (GOWN DISPOSABLE) ×4 IMPLANT
HOLDER FOLEY CATH W/STRAP (MISCELLANEOUS) ×1 IMPLANT
KIT TURNOVER CYSTO (KITS) ×2 IMPLANT
MANIFOLD NEPTUNE II (INSTRUMENTS) ×2 IMPLANT
PACK CYSTO (CUSTOM PROCEDURE TRAY) ×2 IMPLANT
SYR 50ML LL SCALE MARK (SYRINGE) ×1 IMPLANT
TRACTIP FLEXIVA PULS ID 200XHI (Laser) ×1 IMPLANT
TRACTIP FLEXIVA PULSE ID 200 (Laser)
TUBE CONNECTING 12X1/4 (SUCTIONS) ×2 IMPLANT
WATER STERILE IRR 500ML POUR (IV SOLUTION) IMPLANT

## 2022-03-19 NOTE — Anesthesia Preprocedure Evaluation (Signed)
Anesthesia Evaluation  Patient identified by MRN, date of birth, ID band Patient awake    Reviewed: Allergy & Precautions, NPO status , Patient's Chart, lab work & pertinent test results  Airway Mallampati: II  TM Distance: >3 FB Neck ROM: Full    Dental no notable dental hx.    Pulmonary sleep apnea and Continuous Positive Airway Pressure Ventilation , former smoker,    Pulmonary exam normal breath sounds clear to auscultation       Cardiovascular hypertension, Normal cardiovascular exam Rhythm:Regular Rate:Normal     Neuro/Psych negative neurological ROS  negative psych ROS   GI/Hepatic negative GI ROS, Neg liver ROS,   Endo/Other  negative endocrine ROS  Renal/GU negative Renal ROS  negative genitourinary   Musculoskeletal negative musculoskeletal ROS (+)   Abdominal   Peds negative pediatric ROS (+)  Hematology negative hematology ROS (+)   Anesthesia Other Findings   Reproductive/Obstetrics negative OB ROS                             Anesthesia Physical Anesthesia Plan  ASA: 2  Anesthesia Plan: General   Post-op Pain Management: Minimal or no pain anticipated   Induction: Intravenous  PONV Risk Score and Plan: 2 and Ondansetron, Dexamethasone and Treatment may vary due to age or medical condition  Airway Management Planned: LMA  Additional Equipment:   Intra-op Plan:   Post-operative Plan: Extubation in OR  Informed Consent: I have reviewed the patients History and Physical, chart, labs and discussed the procedure including the risks, benefits and alternatives for the proposed anesthesia with the patient or authorized representative who has indicated his/her understanding and acceptance.     Dental advisory given  Plan Discussed with: CRNA and Surgeon  Anesthesia Plan Comments:         Anesthesia Quick Evaluation

## 2022-03-19 NOTE — Discharge Instructions (Addendum)
You may see some blood in the urine and may have some burning with urination for 48-72 hours. You also may notice that you have to urinate more frequently or urgently after your procedure which is normal.  You should call should you develop an inability urinate, fever > 101, persistent nausea and vomiting that prevents you from eating or drinking to stay hydrated.   If you have a catheter, you will be taught how to take care of the catheter by the nursing staff prior to discharge from the hospital.  You may periodically feel a strong urge to void with the catheter in place.  This is a bladder spasm and most often can occur when having a bowel movement or moving around. It is typically self-limited and usually will stop after a few minutes.  You may use some Vaseline or Neosporin around the tip of the catheter to reduce friction at the tip of the penis. You may also see some blood in the urine.  A very small amount of blood can make the urine look quite red.  As long as the catheter is draining well, there usually is not a problem.  However, if the catheter is not draining well and is bloody, you should call the office [phone]) to notify us.  If the urine is fairly clear it is okay to remove the catheter on Tuesday morning as instructed/taught by nurses.   Post Anesthesia Home Care Instructions  Activity: Get plenty of rest for the remainder of the day. A responsible individual must stay with you for 24 hours following the procedure.  For the next 24 hours, DO NOT: -Drive a car -Paediatric nurse -Drink alcoholic beverages -Take any medication unless instructed by your physician -Make any legal decisions or sign important papers.  Meals: Start with liquid foods such as gelatin or soup. Progress to regular foods as tolerated. Avoid greasy, spicy, heavy foods. If nausea and/or vomiting occur, drink only clear liquids until the nausea and/or vomiting subsides. Call your physician if vomiting  continues.  Special Instructions/Symptoms: Your throat may feel dry or sore from the anesthesia or the breathing tube placed in your throat during surgery. If this causes discomfort, gargle with warm salt water. The discomfort should disappear within 24 hours.         Post Anesthesia Home Care Instructions  Activity: Get plenty of rest for the remainder of the day. A responsible individual must stay with you for 24 hours following the procedure.  For the next 24 hours, DO NOT: -Drive a car -Paediatric nurse -Drink alcoholic beverages -Take any medication unless instructed by your physician -Make any legal decisions or sign important papers.  Meals: Start with liquid foods such as gelatin or soup. Progress to regular foods as tolerated. Avoid greasy, spicy, heavy foods. If nausea and/or vomiting occur, drink only clear liquids until the nausea and/or vomiting subsides. Call your physician if vomiting continues.  Special Instructions/Symptoms: Your throat may feel dry or sore from the anesthesia or the breathing tube placed in your throat during surgery. If this causes discomfort, gargle with warm salt water. The discomfort should disappear within 24 hours.  If you had a scopolamine patch placed behind your ear for the management of post- operative nausea and/or vomiting:  1. The medication in the patch is effective for 72 hours, after which it should be removed.  Wrap patch in a tissue and discard in the trash. Wash hands thoroughly with soap and water. 2. You may remove  the patch earlier than 72 hours if you experience unpleasant side effects which may include dry mouth, dizziness or visual disturbances. 3. Avoid touching the patch. Wash your hands with soap and water after contact with the patch.

## 2022-03-19 NOTE — H&P (Signed)
H&P  Chief Complaint: Bladder stone  History of Present Illness: 66 year old male presents at this time for cystoscopic management of a bladder calculus.  He has an extensive urologic history following management of prostate cancer with subsequent salvage radiotherapy.  No evidence of recurrence at this point.  He did have placement of a penile prosthesis with subsequent explant/replacement of the reservoir.  That subsequently became infected.  He then underwent Mulcahey procedure with placement of semirigid rods.  He recently had gross hematuria.  This was mainly after activity.  Cystoscopy revealed an anterior bladder stone attached to his urothelium.  More than likely, this is associated with a foreign body/possible suture.  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   ctcl cutaneous t cell lymphoma   Prostate cancer ([place]) 2017   Rosacea     Past Surgical History:  Procedure Laterality Date   CHOLECYSTECTOMY  1994 or1995   CYSTOSCOPY W/ RETROGRADES Left 01/20/2017   Procedure: CYSTOSCOPY WITH RETROGRADE PYELOGRAM, LEFT STENT PLACEMENT(STRING OFF);  Surgeon: Raynelle Bring, MD;  Location: [facility];  Service: Urology;  Laterality: Left;   CYSTOSCOPY WITH RETROGRADE PYELOGRAM, URETEROSCOPY AND STENT PLACEMENT Left 01/17/2017   Procedure: CYSTOSCOPY WITH RETROGRADE PYELOGRAM, URETEROSCOPY, HOLMIUM LASER AND  LEFT STENT PLACEMENT;  Surgeon: Franchot Gallo, MD;  Location: [facility];  Service: Urology;  Laterality: Left;   PENILE PROSTHESIS IMPLANT N/A 06/23/2020   Procedure: PENILE PROTHESIS INFLATABLE;  Surgeon: Franchot Gallo, MD;  Location: [facility];  Service: Urology;  Laterality: N/A;  2 HRS   PENILE PROSTHESIS IMPLANT N/A 11/14/2020    Procedure: Excision of prosthesis resevoir, Closure of bladder defect, Suprapubic tube placement;  Surgeon: Franchot Gallo, MD;  Location: [facility];  Service: Urology;  Laterality: N/A;   PENILE PROSTHESIS IMPLANT N/A 02/23/2021   Procedure: PENILE PROTHESIS RESERVOIR;  Surgeon: Franchot Gallo, MD;  Location: [facility];  Service: Urology;  Laterality: N/A;  [place]  2017   radical prostectomy @ [facility]-   receiving chronic interferon      TONSILLECTOMY  as child   adenoids removed   URETERAL STENT PLACEMENT     wisdom  teeth extraction  1990    Home Medications:    Allergies:  Allergies  Allergen Reactions   Lisinopril     Other reaction(s): cough, ED    Family History  Problem Relation Age of Onset   Stroke Father    Hypertension Father    Head & neck cancer Father    Hypertension Mother    Skin cancer Mother    Prostate cancer Paternal Uncle        prostate ca   Colon cancer Maternal Grandmother    Breast cancer Neg Hx     Social History:  reports that he quit smoking about 7 years ago. His smoking use included cigarettes. He has a 5.00 pack-year smoking history. He has never used smokeless tobacco. He reports current alcohol use. He reports current drug use. Drug: Marijuana.  ROS: A complete review of systems was performed.  All systems are negative except for pertinent findings as noted.  Physical Exam:  Vital signs in last 24 hours: BP (!) 137/94   Pulse  66   Temp (!) 97.2 F (36.2 C) (Oral)   Resp 16   Ht 6' (1.829 m)   Wt 103.7 kg   SpO2 97%   BMI 31.00 kg/m  Constitutional:  Alert and oriented, No acute distress Cardiovascular: Regular rate  Respiratory: Normal respiratory effort Neurologic: Grossly intact, no focal deficits Psychiatric: Normal mood and affect  I have reviewed prior pt notes  I have reviewed prior urine culture   Impression/Assessment:  Bladder calculus, most likely associated with foreign  body/suture  Plan:  Cystoscopy, laser of bladder stone, removal of foreign body cystoscopically, possible suprapubic tube placement versus Foley catheter placement for postoperative recovery

## 2022-03-19 NOTE — Anesthesia Procedure Notes (Signed)
Procedure Name: LMA Insertion Date/Time: 03/19/2022 8:19 AM  Performed by: Clearnce Sorrel, CRNAPre-anesthesia Checklist: Patient identified, Emergency Drugs available, Suction available and Patient being monitored Patient Re-evaluated:Patient Re-evaluated prior to induction Oxygen Delivery Method: Circle System Utilized Preoxygenation: Pre-oxygenation with 100% oxygen Induction Type: IV induction Ventilation: Mask ventilation without difficulty LMA: LMA inserted LMA Size: 5.0 Number of attempts: 1 Airway Equipment and Method: Bite block Placement Confirmation: positive ETCO2 Tube secured with: Tape Dental Injury: Teeth and Oropharynx as per pre-operative assessment

## 2022-03-19 NOTE — Op Note (Addendum)
Preoperative diagnosis: Bladder calculus, anterior wall  Postoperative diagnosis: Same  Principal procedure: Cystoscopy, cystolitholapaxy of anterior bladder wall calculus, 10 mm in size using holmium laser  Surgeon: Gatsby Chismar  Anesthesia: General with LMA  Complications: None  Specimen: None  Drains: 14 French Foley catheter  Estimated blood loss: Less than 5 mL  Indications: 66 year old male with present history starting with high risk prostate cancer.  He underwent radical prostatectomy at [facility] several years ago with eventual salvage radiotherapy for PSA recurrence.  He underwent placement of an inflatable penile prosthesis after that with subsequent erosion of his reservoir into the bladder.  His reservoir was explanted, and several months later without evidence of infection of his remaining IPP, he underwent placement of ectopic reservoir.  Unfortunately, he presented with an infected prosthesis with subsequent explant/Mulcahey procedure/placement of semirigid rod.  That has been functioning well.  The patient recently presented with intermittent gross hematuria, mainly after vigorous activity.  Cystoscopy revealed an anterior bladder wall stone to the left of the midline.  He presents at this time for cystoscopic management of this.  Findings: Urethra was normal.  Prostate was absent.  Minimal telangiectatic vessels on the patient's bladder neck area consistent with radiation.  No urothelial lesions were noted.  There was a stone adherent to the bladder and the left anterior bladder wall.  No other associated findings were noted i.e. foreign bodies.  Description of procedure: The patient was properly identified in the holding area.  He is taken to the operating room where general anesthetic was administered with the LMA.  He was placed in the dorsolithotomy position.  Genitalia and perineum were prepped, draped, proper timeout performed.  Flexible cystoscope was then easily  passed through the urethra.  No lesions or stricture were seen.  Prostate was absent.  Bladder was systematically inspected.  Above-mentioned findings noted.  I first used the graspers through the cystoscope.  The largest portion of the stone was removed in this manner.  Several more adherent stones were either pushed off with the beak of the scope.  Following management of the stone burden in this manner, I did not see any underlying foreign body.  I did treat this area with laser energy-1.8 J and 30 Hz.  After this, I still did not see any specific foreign bodies, mainly just old scar.  There being no further stone matter, no foreign body to treat and no bleeding, I terminated the procedure.  I did not pass a larger scope to remove the stone burden as I felt the small fragments would easily pass with urination.  I did feel the need to leave a Foley catheter and to drain because of the trauma to the bladder wall.  I removed the cystoscope under direct vision.  I did not see any urethral lesions or significant trauma to the urethra.  I then passed a 14 French Foley catheter.  Balloon filled with 10 cc of water.  Hooked to dependent drainage.  The patient tolerated the procedure well.  He was awakened and taken to the PACU in stable condition.  We will teach the patient to remove the catheter on Tuesday morning.

## 2022-03-19 NOTE — Transfer of Care (Signed)
Immediate Anesthesia Transfer of Care Note  Patient: Kevin [name]  Procedure(s) Performed: CYSTOSCOPY WITH LITHOLAPAXY HOLMIUM LASER APPLICATION  Patient Location: PACU  Anesthesia Type:General  Level of Consciousness: drowsy  Airway & Oxygen Therapy: Patient Spontanous Breathing  Post-op Assessment: Report given to RN and Post -op Vital signs reviewed and stable  Post vital signs: Reviewed and stable  Last Vitals:  Vitals Value Taken Time  BP 133/92 03/19/22 0903  Temp    Pulse 67 03/19/22 0905  Resp 10 03/19/22 0905  SpO2 95 % 03/19/22 0905  Vitals shown include unvalidated device data.  Last Pain:  Vitals:   03/19/22 0711  TempSrc: Oral  PainSc: 0-No pain      Patients Stated Pain Goal: 2 (52/48/18 5909)  Complications: No notable events documented.

## 2022-03-20 ENCOUNTER — Encounter (HOSPITAL_BASED_OUTPATIENT_CLINIC_OR_DEPARTMENT_OTHER): Payer: Self-pay | Admitting: Urology

## 2022-03-20 NOTE — Anesthesia Postprocedure Evaluation (Signed)
Anesthesia Post Note  Patient: Kevin [name]  Procedure(s) Performed: CYSTOSCOPY WITH LITHOLAPAXY HOLMIUM LASER APPLICATION     Patient location during evaluation: PACU Anesthesia Type: General Level of consciousness: awake and alert Pain management: pain level controlled Vital Signs Assessment: post-procedure vital signs reviewed and stable Respiratory status: spontaneous breathing, nonlabored ventilation, respiratory function stable and patient connected to nasal cannula oxygen Cardiovascular status: blood pressure returned to baseline and stable Postop Assessment: no apparent nausea or vomiting Anesthetic complications: no   No notable events documented.  Last Vitals:  Vitals:   03/19/22 0915 03/19/22 1002  BP: 126/85 125/88  Pulse: 68 64  Resp: 15 14  Temp:  36.7 C  SpO2: 94% 98%    Last Pain:  Vitals:   03/19/22 1002  TempSrc:   PainSc: 2                  Destinae Neubecker S

## 2022-03-20 NOTE — Anesthesia Postprocedure Evaluation (Signed)
Anesthesia Post Note  Patient: Kevin [name]  Procedure(s) Performed: CYSTOSCOPY WITH LITHOLAPAXY HOLMIUM LASER APPLICATION     Patient location during evaluation: PACU Anesthesia Type: General Level of consciousness: awake and alert Pain management: pain level controlled Vital Signs Assessment: post-procedure vital signs reviewed and stable Respiratory status: spontaneous breathing, nonlabored ventilation, respiratory function stable and patient connected to nasal cannula oxygen Cardiovascular status: blood pressure returned to baseline and stable Postop Assessment: no apparent nausea or vomiting Anesthetic complications: no   No notable events documented.  Last Vitals:  Vitals:   03/19/22 0915 03/19/22 1002  BP: 126/85 125/88  Pulse: 68 64  Resp: 15 14  Temp:  36.7 C  SpO2: 94% 98%    Last Pain:  Vitals:   03/19/22 1002  TempSrc:   PainSc: 2                  [name] S

## 2022-03-21 DIAGNOSIS — G4733 Obstructive sleep apnea (adult) (pediatric): Secondary | ICD-10-CM | POA: Diagnosis not present

## 2022-03-21 DIAGNOSIS — C44722 Squamous cell carcinoma of skin of right lower limb, including hip: Secondary | ICD-10-CM | POA: Diagnosis not present

## 2022-04-02 DIAGNOSIS — Z8546 Personal history of malignant neoplasm of prostate: Secondary | ICD-10-CM | POA: Diagnosis not present

## 2022-04-02 DIAGNOSIS — N210 Calculus in bladder: Secondary | ICD-10-CM | POA: Diagnosis not present

## 2022-04-02 DIAGNOSIS — R8271 Bacteriuria: Secondary | ICD-10-CM | POA: Diagnosis not present

## 2022-04-17 DIAGNOSIS — Z79899 Other long term (current) drug therapy: Secondary | ICD-10-CM | POA: Diagnosis not present

## 2022-04-17 DIAGNOSIS — C8400 Mycosis fungoides, unspecified site: Secondary | ICD-10-CM | POA: Diagnosis not present

## 2022-04-18 DIAGNOSIS — G4733 Obstructive sleep apnea (adult) (pediatric): Secondary | ICD-10-CM | POA: Diagnosis not present

## 2022-05-14 MED ORDER — PEGASYS 180 MCG/ML SUBCUTANEOUS SOLUTION
SUBCUTANEOUS | 5 refills | 35 days
Start: 2022-05-14 — End: ?

## 2022-05-16 DIAGNOSIS — Z85820 Personal history of malignant melanoma of skin: Secondary | ICD-10-CM | POA: Diagnosis not present

## 2022-05-16 DIAGNOSIS — L720 Epidermal cyst: Secondary | ICD-10-CM | POA: Diagnosis not present

## 2022-05-16 DIAGNOSIS — C8400 Mycosis fungoides, unspecified site: Secondary | ICD-10-CM | POA: Diagnosis not present

## 2022-05-16 DIAGNOSIS — L570 Actinic keratosis: Secondary | ICD-10-CM | POA: Diagnosis not present

## 2022-05-16 DIAGNOSIS — D485 Neoplasm of uncertain behavior of skin: Secondary | ICD-10-CM | POA: Diagnosis not present

## 2022-05-16 DIAGNOSIS — D225 Melanocytic nevi of trunk: Secondary | ICD-10-CM | POA: Diagnosis not present

## 2022-05-16 DIAGNOSIS — D045 Carcinoma in situ of skin of trunk: Secondary | ICD-10-CM | POA: Diagnosis not present

## 2022-05-19 DIAGNOSIS — G4733 Obstructive sleep apnea (adult) (pediatric): Secondary | ICD-10-CM | POA: Diagnosis not present

## 2022-05-31 DIAGNOSIS — Z86718 Personal history of other venous thrombosis and embolism: Secondary | ICD-10-CM | POA: Diagnosis not present

## 2022-05-31 DIAGNOSIS — C61 Malignant neoplasm of prostate: Secondary | ICD-10-CM | POA: Diagnosis not present

## 2022-05-31 DIAGNOSIS — C84A0 Cutaneous T-cell lymphoma, unspecified, unspecified site: Secondary | ICD-10-CM | POA: Diagnosis not present

## 2022-05-31 DIAGNOSIS — Z0001 Encounter for general adult medical examination with abnormal findings: Secondary | ICD-10-CM | POA: Diagnosis not present

## 2022-05-31 DIAGNOSIS — Z800 Family history of malignant neoplasm of digestive organs: Secondary | ICD-10-CM | POA: Diagnosis not present

## 2022-06-01 DIAGNOSIS — E785 Hyperlipidemia, unspecified: Secondary | ICD-10-CM | POA: Diagnosis not present

## 2022-06-01 DIAGNOSIS — E538 Deficiency of other specified B group vitamins: Secondary | ICD-10-CM | POA: Diagnosis not present

## 2022-06-01 DIAGNOSIS — E559 Vitamin D deficiency, unspecified: Secondary | ICD-10-CM | POA: Diagnosis not present

## 2022-06-17 DIAGNOSIS — G4733 Obstructive sleep apnea (adult) (pediatric): Secondary | ICD-10-CM | POA: Diagnosis not present

## 2022-06-21 DIAGNOSIS — G4733 Obstructive sleep apnea (adult) (pediatric): Secondary | ICD-10-CM | POA: Diagnosis not present

## 2022-06-25 DIAGNOSIS — D045 Carcinoma in situ of skin of trunk: Secondary | ICD-10-CM | POA: Diagnosis not present

## 2022-07-17 DIAGNOSIS — G4733 Obstructive sleep apnea (adult) (pediatric): Secondary | ICD-10-CM | POA: Diagnosis not present

## 2022-07-30 DIAGNOSIS — Z8546 Personal history of malignant neoplasm of prostate: Secondary | ICD-10-CM | POA: Diagnosis not present

## 2022-08-08 DIAGNOSIS — C61 Malignant neoplasm of prostate: Secondary | ICD-10-CM | POA: Diagnosis not present

## 2022-08-17 DIAGNOSIS — G4733 Obstructive sleep apnea (adult) (pediatric): Secondary | ICD-10-CM | POA: Diagnosis not present

## 2022-09-13 DIAGNOSIS — Z85828 Personal history of other malignant neoplasm of skin: Secondary | ICD-10-CM | POA: Diagnosis not present

## 2022-09-13 DIAGNOSIS — L820 Inflamed seborrheic keratosis: Secondary | ICD-10-CM | POA: Diagnosis not present

## 2022-09-13 DIAGNOSIS — C8400 Mycosis fungoides, unspecified site: Secondary | ICD-10-CM | POA: Diagnosis not present

## 2022-09-13 MED ORDER — PEGASYS 180 MCG/ML SUBCUTANEOUS SOLUTION
SUBCUTANEOUS | 4 refills | 0 days
Start: 2022-09-13 — End: ?

## 2022-09-13 MED FILL — PEGASYS 180 MCG/ML SUBCUTANEOUS SOLUTION: SUBCUTANEOUS | 28 days supply | Qty: 4 | Fill #0

## 2022-09-13 NOTE — Unmapped (Signed)
[facility]   Patient Onboarding/Medication Counseling    Gregory Garrett is a 66 y.o. 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  I am speaking to the patient.    Was a Nurse, learning disability used for this call? No    Verified patient's date of birth / HIPAA.    Specialty medication(s) to be sent: Hematology/Oncology: Pegasys      Non-specialty medications/supplies to be sent: none      Medications not needed at this time: n/a       Pegasys (Peginterferon Alfa-2a)    The patient declined counseling on medication administration, missed dose instructions, goals of therapy, side effects and monitoring parameters, warnings and precautions, drug/food interactions, and storage, handling precautions, and disposal because they have taken the medication previously. The information in the declined sections below are for informational purposes only and was not discussed with patient.       Medication & Administration     Dosage:   Chronic hepatitis B: Inject 180 mcg SQ once weekly for 48 weeks.   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 Inject in Abdomen or thigh. (Usual starting dose 45-90 mcg weekly; may be increased every 2-4 weeks to max dose of 180 mcg); continue as long as clinically benefiting    Administration: SQ dosing (Has patient been provided or will patient be provided in-clinic administration education? N/a - continuation of therapy)  Inject on the same day and approximately same time each week  May inject in the stomach (2 inches away from [employer]) or thigh.  And rotate sites with each injection.  Do not shake vial, prefilled syringe, or autoinjector.   Drink plenty of water while on therapy  Preparation for administration:  Allow syringe, autoinjector, or vial to reach room temperature before use; wait for condensation on the outside of the syringe or autoinjector to disappear before use.   The vial may be warmed by gently rolling in the palms of the hand for ~1 minute.   Allow the prefilled syringe to come to room temperature by laying it on a flat, clean surface for a few minutes.   Allow the autoinjector to come to room temperature on its own for ~20 minutes; do not warm autoinjector any other way. Use 180 mcg/mL vial to prepare pediatric dose; use 1 mL tuberculin syringe to withdraw dose from vial  Wash hands and gather supplies - medication, syringe, alcohol swabs, sharps container, Band-Aid, cotton balls  Check medication for expiration date, check to make sure no particles floating in the liquid and clear colorless liquid  Clean injection site with alcohol swab and let dry.    If using a vial and syringe: Make sure you draw up the correct dose and use a new needle/syringe each time.  Remove cap from the vial and clean top of vial with alcohol swab.    Remove syringe from package and attach needle if needed  Remove cap from needle and draw air into the syringe matching the dose you will be injecting  Insert that air into the open area in the vial (not in liquid) by pushing the needle through the rubber stopper on the vial and pushing down on the plunger  Turn vial upside down (keeping needle in the vial) and then pull back on plunger to draw correct amount of medication.  Hold syringe with needle pointing toward ceiling and remove any air bubbles  Pinch a fold of skin between thumb and forefinger  Hold syringe like a pencil and insert in a quick dart-like motion at a 45-90 degree angle and push needle into the skin as far as it will go  Then remove hand use to pinch skin and use it to hold the syringe barrel and slightly pull back on plunger.  If blood comes up discard and repeat steps.    If no blood then inject the medication by gently pressing plunger all the way down until the syringe is empty.  Pull needle out of skin, cover with the green needle cover and dispose in a sharps container.   Wipe the area with an alcohol pad.  If using pre-filled syringe:  Remove the needle from its package  Remove and throw away the rubber cap from the tip of the syringe barrel  Hold the syringe by the barrel with one hand and the needle close to the hub where the green needle cover connects to the syringe with the other hand  Push the needle onto the syringe and tighten by using an easy twisting motion  Pull the green needle cover back from the needle toward the syringe barrel.  Do not remove it.  Remove the plastic needle shield b pulling it straight off  Remove air bubbles and set dose to prescribed dose  Pinch a fold of skin between thumb and forefinger  Hold syringe like a pencil and insert in a quick dart-like motion at a 45-90 degree angle and push needle into the skin as far as it will go  Then remove hand use to pinch skin and use it to hold the syringe barrel and slightly pull back on plunger.  If blood comes up discard and repeat steps.    If no blood then inject the medication by gently pressing plunger all the way down until the syringe is empty.  Pull needle out of skin, cover with the green needle cover and dispose in a sharps container.   Wipe the area with an alcohol pad.  If using auto-injector:  Remove blue cap from autoinjector (DO NOT reattach)  Pinch skin between thumb and index finger and hold it  Hold injector against skin at 90 degree angle and push firmly against skin until the red needle-shield is completely pushed in.  Now the autoinjector is unlocked and ready for injection.  Press the blue activation button once with your thumb and release right away.  Keeping the injector firmly against your skin.  You will hear a click indicating injection has started. The red indicator will move down the viewing window. You will hear a second click and the viewing window will be completely red.  Now count to 10 and then lift injector straight up from the skin.    Needle shield will cover the needle  Dispose injector into sharps container.  Medication Pearls/Tips:  Acetaminophen (Tylenol) 500 or 650 mg by mouth is often recommended before each Pegasys dose to reduce the chance of fever  Polycythemia Vera sometimes causes itching after a hot bath or shower. This is known as aquagenic pruritus. A medicine known as ???paroxetine,??? or ???Paxil??,??? is sometimes prescribed to decrease itching    Adherence/Missed dose instructions:   If within 2 days of the usual day  of administration, administer dose as soon as possible then resume prior schedule  If >2 days after the usual day of administration then contact health care provider.    Goals of Therapy   Treatment of adults with hepatitis B and C infections  Treat Polycythemia Vera    Side Effects & Monitoring Parameters   Common side effects:  Fatigue (56%)  Flu-like signs. These include fever (37%-54%), chills, shakes, and sweating (25%-35%)  Headache (27%-54%)  Anxiety, irritability, nervousness, depression (16%-18%)  Nausea or vomiting (24%), loss of appetite, stomach pain, diarrhea (16%-18%)  Back, muscle, or joint pain (28%-37%)  Feeling dizzy, tired, or weak (<56%)  Hair loss (18%-23%)  Trouble sleeping (19%)  Irritation where the shot is given (22%)  Skin rash (10%)    The following side effects should be reported to the provider:   Signs of an allergic reaction (rash; hives; itching;  wheezing; tightness in the chest or throat; trouble breathing, swallowing, or talking; unusual hoarseness; or swelling of the mouth, face, lips, tongue, or throat)  Signs of high blood pressure (very bad headache or dizziness, passing out, or change in eyesight)  Signs of thyroid problems (change in weight; feeling nervous, excitable, restless, or weak; hair thinning; depression; neck swelling; not able to focus; trouble with heat or cold; menstrual changes; shakiness; or sweating)  Chest pain or pressure, a fast heartbeat, or an abnormal heartbeat.  Shortness of breath, a big weight gain, or swelling in the arms or legs.  Weakness on 1 side of the body, trouble speaking or thinking, change in balance, drooping on one side of the face, or blurred eyesight.  Memory problems or loss.  Not able to focus.  Signs of liver problems (dark urine, feeling tired, not hungry, upset stomach or stomach pain, light-colored stools, throwing up, or yellow skin or eyes)  Signs of severe bowel problems (colitis) have happened within 12 weeks of treatment with alpha interferons (severe stomach pain, bloody diarrhea, throwing up blood, or throw up that looks like coffee grounds)    Monitoring Parameters:  Visual exam at baseline  Baseline CBC; CMP; Uric acid; then weekly x 1 month; monthly for next 3-12 months; then every 6 months thereafter  Thyroid function test every 12 weeks  Monitor hepatic function closely during use  Pregnancy test for women    Contraindications, Warnings, & Precautions     Neuropsychiatric disorders: [US Boxed Warning]: May cause or exacerbate life-threatening neuropsychiatric disorders; monitor closely; discontinue treatment with worsening or persistently severe signs/symptoms of neuropsychiatric disorders. In most cases these effects were reversible following discontinuation, but not all cases. Neuropsychiatric adverse effects include depression, suicidal ideation, suicide attempt, homicidal ideation, drug overdose, and relapse of drug addiction, and may occur in patients with or without a prior history of psychiatric disorder. Avoid use in severe psychiatric disorders; use with extreme caution in patients with a history of depression.  May cause CNS depression, which may impair physical or mental abilities; patients must be cautioned about performing tasks that require mental alertness (eg, operating machinery or driving)   Dermatologic effects: Serious cutaneous reactions, including vesiculobullous eruptions, Stevens-Johnson syndrome, and exfoliative dermatitis, have been reported with use, with or without ribavirin therapy; discontinue with signs or symptoms of severe skin reactions.  Ophthalmic effects: Decreased or loss of vision and retinopathy, including macular edema, optic neuritis, papilledema, retinal hemorrhages, retinal detachment (serous), cotton wool spots, and retinal artery or vein thrombosis, may occur or be aggravated during treatment; if any ocular symptoms  occur during use, a complete eye exam should be performed promptly. Prior to use, all patients should have a visual exam and patients with preexisting disorders (eg, diabetic or hypertensive retinopathy) should have exams periodically during therapy. Discontinue if new or worsening ophthalmologic disorders occur.  Gastrointestinal effects: Gastrointestinal hemorrhage, ulcerative and hemorrhagic/ischemic colitis (may be fatal) have been observed with interferon alfa treatment; may be severe and/or life-threatening; discontinue immediately if symptoms of colitis (eg, abdominal pain, bloody diarrhea, and/or fever) develop. Colitis generally resolves within 1 to 3 weeks of discontinuation  Pancreatitis: Pancreatitis, including fatal cases, has been observed with alfa interferon and ribavirin therapy. Withhold treatment for suspected pancreatitis; discontinue therapy for confirmed pancreatitis.  Pulmonary effects: May cause or aggravate dyspnea, pulmonary infiltrates, pneumonia, bronchiolitis obliterans, interstitial pneumonitis, pulmonary hypertension, and sarcoidosis, which may result in potentially fatal respiratory failure; may recur upon rechallenge with interferons. Monitor closely. Discontinue with pulmonary infiltrates or evidence of impaired pulmonary function. Use with caution in patients with pulmonary dysfunction or a history of pulmonary disease.    Drug/Food Interactions   Medication list reviewed in Epic. The patient was instructed to inform the care team before taking any new medications or supplements. No drug interactions identified.   Avoid alcohol while on this treatment    Storage, Handling Precautions, & Disposal   Store in refrigerator at 2??C to 8??C (36??F to 46??F).   Do not leave out of the refrigerator for more than 24 hours.   Do not freeze or shake.   Protect from light.   Discard any unused portion.   The following stability information has also been reported:  Intact vial: May be stored at room temperature for up to 14 days   Prefilled syringe: May be stored at room temperature for up to 6 days       Current Medications (including OTC/herbals), Comorbidities and Allergies     Current Outpatient Medications   Medication Sig Dispense Refill    peginterferon alfa-2a (PEGASYS) 180 mcg/mL injection Inject 0.75 mL (135 mcg total) under the skin once a week. 4 mL 5     No current facility-administered medications for this visit.       Not on File    There is no problem list on file for this patient.      Reviewed and up to date in Epic.    Appropriateness of Therapy     Acute infections noted within Epic:  No active infections  Patient reported infection: None    Is medication and dose appropriate based on diagnosis and infection status? Yes    Prescription has been clinically reviewed: Yes      Baseline Quality of Life Assessment      How many days over the past month did your T-cell Lymphoma  keep you from your normal activities? For example, brushing your teeth or getting up in the morning. 0    Financial Information     Medication Assistance provided: None Required    Anticipated copay of $0 reviewed with patient. Verified delivery address.    Delivery Information     Scheduled delivery date: 09/14/22    Expected start date: 09/14/22    Medication will be delivered via UPS to the prescription address in [facility].  This shipment will not require a signature.      Explained the services we provide at [facility] and that each month we would call to set up refills.  Stressed importance of returning phone  calls so that we could ensure they receive their medications in time each month.  Informed patient that we should be setting up refills 7-10 days prior to when they will run out of medication.  A pharmacist will reach out to perform a clinical assessment periodically.  Informed patient that a welcome packet, containing information about our pharmacy and other support services, a Notice of Privacy Practices, and a drug information handout will be sent.      The patient or caregiver noted above participated in the development of this care plan and knows that they can request review of or adjustments to the care plan at any time.      Patient or caregiver verbalized understanding of the above information as well as how to contact the pharmacy at [phone] option 4 with any questions/concerns.  The pharmacy is open Monday through Friday 8:30am-4:30pm.  A pharmacist is available 24/7 via pager to answer any clinical questions they may have.    Patient Specific Needs     Does the patient have any physical, cognitive, or cultural barriers? No    Does the patient have adequate living arrangements? (i.e. the ability to store and take their medication appropriately) Yes    Did you identify any home environmental safety or security hazards? No    Patient prefers to have medications discussed with  Patient     Is the patient or caregiver able to read and understand education materials at a high school level or above? Yes    Patient's primary language is  English     Is the patient high risk? No    SOCIAL DETERMINANTS OF HEALTH     At the [facility], we have learned that life circumstances - like trouble affording food, housing, utilities, or transportation can affect the health of many of our patients.   That is why we wanted to ask: are you currently experiencing any life circumstances that are negatively impacting your health and/or quality of life? Patient declined to answer    Social Determinants of Health     Financial Resource Strain: Not on file   Internet Connectivity: Not on file   Food Insecurity: Not on file   Tobacco Use: Not on file   Housing/Utilities: Not on file   Alcohol Use: Not on file   Transportation Needs: Not on file   Substance Use: Not on file   Health Literacy: Not on file   Physical Activity: Not on file   Interpersonal Safety: Not on file   Stress: Not on file   Intimate Partner Violence: Not on file   Depression: Not on file   Social Connections: Not on file       Would you be willing to receive help with any of the needs that you have identified today? Not applicable       Arnold Long, PharmD  [facility] Specialty Pharmacist

## 2022-09-14 MED ORDER — EMPTY CONTAINER
INTRAMUSCULAR | 2 refills | 0 days
Start: 2022-09-14 — End: ?

## 2022-09-14 MED ORDER — SYRINGE WITH NEEDLE 1 ML 21 GAUGE X 1"
INTRAMUSCULAR | 0 refills | 0 days
Start: 2022-09-14 — End: ?

## 2022-09-14 MED FILL — EMPTY CONTAINER: INTRAMUSCULAR | 120 days supply | Qty: 1 | Fill #0

## 2022-09-14 MED FILL — BD TUBERCULIN SYRINGE 1 ML 21 GAUGE X 1": INTRAMUSCULAR | 28 days supply | Qty: 4 | Fill #0

## 2022-09-17 DIAGNOSIS — G4733 Obstructive sleep apnea (adult) (pediatric): Secondary | ICD-10-CM | POA: Diagnosis not present

## 2022-09-21 DIAGNOSIS — G4733 Obstructive sleep apnea (adult) (pediatric): Secondary | ICD-10-CM | POA: Diagnosis not present

## 2022-09-27 MED ORDER — BD TUBERCULIN SYRINGE 1 ML 21 GAUGE X 1"
INTRAMUSCULAR | 0 refills | 28 days
Start: 2022-09-27 — End: ?

## 2022-10-02 MED ORDER — BD TUBERCULIN SYRINGE 1 ML 21 GAUGE X 1"
INTRAMUSCULAR | 0 refills | 28 days
Start: 2022-10-02 — End: ?

## 2022-10-04 MED ORDER — BD TUBERCULIN SYRINGE 1 ML 21 GAUGE X 1"
1 refills | 0 days
Start: 2022-10-04 — End: ?

## 2022-10-04 MED ORDER — SYRINGE WITH NEEDLE 1 ML 21 GAUGE X 1"
INTRAMUSCULAR | 0 refills | 28 days
Start: 2022-10-04 — End: ?

## 2022-10-10 DIAGNOSIS — H4311 Vitreous hemorrhage, right eye: Secondary | ICD-10-CM | POA: Diagnosis not present

## 2022-10-12 DIAGNOSIS — C84A0 Cutaneous T-cell lymphoma, unspecified, unspecified site: Principal | ICD-10-CM

## 2022-10-13 NOTE — Unmapped (Signed)
[facility] Refill Coordination Note    Specialty Medication(s) to be Shipped:   Hematology/Oncology: Pegasys 180 mcg/mL    Other medication(s) to be shipped: No additional medications requested for fill at this time     Gregory Garrett, DOB: [birth year]  Phone: [phone] (home)       All above HIPAA information was verified with patient.     Was a Nurse, learning disability used for this call? No    Completed refill call assessment today to schedule patient's medication shipment from the [facility] [phone]).  All relevant notes have been reviewed.     Specialty medication(s) and dose(s) confirmed: Regimen is correct and unchanged.   Changes to medications: Gregory Garrett reports starting the following medications: Prednisone  Changes to insurance: No  New side effects reported not previously addressed with a pharmacist or physician: None reported  Questions for the pharmacist: No    Confirmed patient received a Conservation officer, historic buildings and a Surveyor, mining with first shipment. The patient will receive a drug information handout for each medication shipped and additional FDA Medication Guides as required.       DISEASE/MEDICATION-SPECIFIC INFORMATION        For patients on injectable medications: Patient currently has 0 doses left.  Next injection is scheduled for 10/19/22.    SPECIALTY MEDICATION ADHERENCE     Medication Adherence    Patient reported X missed doses in the last month: 0  Specialty Medication: Pegasys 180 mcg/mL  Patient is on additional specialty medications: No  Informant: patient                       Were doses missed due to medication being on hold? No    Pegasys 180 mcg/mL: 0 days of medicine on hand       REFERRAL TO PHARMACIST     Referral to the pharmacist: Not needed      [facility]     Shipping address confirmed in Epic.     Delivery Scheduled: Yes, Expected medication delivery date: 10/16/22.     Medication will be delivered via UPS to the prescription address in Epic [place].    Gregory Garrett Gregory Garrett   [facility] Specialty Technician

## 2022-10-16 MED FILL — PEGASYS 180 MCG/ML SUBCUTANEOUS SOLUTION: SUBCUTANEOUS | 28 days supply | Qty: 4 | Fill #0

## 2022-10-16 NOTE — Unmapped (Signed)
Pamalee Leyden 's Pegasys shipment will be delayed as a result of a different copay.      I have reached out to the patient  at [phone] and communicated the delivery change. We will reschedule the medication for the delivery date that the patient agreed upon.  We have confirmed the delivery date as 10/17/22, via ups.

## 2022-10-18 DIAGNOSIS — G4733 Obstructive sleep apnea (adult) (pediatric): Secondary | ICD-10-CM | POA: Diagnosis not present

## 2022-10-19 DIAGNOSIS — H33322 Round hole, left eye: Secondary | ICD-10-CM | POA: Diagnosis not present

## 2022-10-19 DIAGNOSIS — H4311 Vitreous hemorrhage, right eye: Secondary | ICD-10-CM | POA: Diagnosis not present

## 2022-10-19 DIAGNOSIS — H43811 Vitreous degeneration, right eye: Secondary | ICD-10-CM | POA: Diagnosis not present

## 2022-10-19 DIAGNOSIS — H43822 Vitreomacular adhesion, left eye: Secondary | ICD-10-CM | POA: Diagnosis not present

## 2022-10-23 DIAGNOSIS — L986 Other infiltrative disorders of the skin and subcutaneous tissue: Secondary | ICD-10-CM | POA: Diagnosis not present

## 2022-10-23 DIAGNOSIS — Z87891 Personal history of nicotine dependence: Secondary | ICD-10-CM | POA: Diagnosis not present

## 2022-10-23 DIAGNOSIS — L985 Mucinosis of the skin: Secondary | ICD-10-CM | POA: Diagnosis not present

## 2022-10-23 DIAGNOSIS — C8400 Mycosis fungoides, unspecified site: Secondary | ICD-10-CM | POA: Diagnosis not present

## 2022-10-23 DIAGNOSIS — I10 Essential (primary) hypertension: Secondary | ICD-10-CM | POA: Diagnosis not present

## 2022-10-23 DIAGNOSIS — F32A Depression, unspecified: Secondary | ICD-10-CM | POA: Diagnosis not present

## 2022-10-23 DIAGNOSIS — N202 Calculus of kidney with calculus of ureter: Secondary | ICD-10-CM | POA: Diagnosis not present

## 2022-10-23 DIAGNOSIS — Z7901 Long term (current) use of anticoagulants: Secondary | ICD-10-CM | POA: Diagnosis not present

## 2022-10-23 DIAGNOSIS — L859 Epidermal thickening, unspecified: Secondary | ICD-10-CM | POA: Diagnosis not present

## 2022-10-23 DIAGNOSIS — L83 Acanthosis nigricans: Secondary | ICD-10-CM | POA: Diagnosis not present

## 2022-10-23 DIAGNOSIS — C8405 Mycosis fungoides, lymph nodes of inguinal region and lower limb: Secondary | ICD-10-CM | POA: Diagnosis not present

## 2022-10-23 DIAGNOSIS — C61 Malignant neoplasm of prostate: Secondary | ICD-10-CM | POA: Diagnosis not present

## 2022-11-02 DIAGNOSIS — H4311 Vitreous hemorrhage, right eye: Secondary | ICD-10-CM | POA: Diagnosis not present

## 2022-11-02 DIAGNOSIS — H43822 Vitreomacular adhesion, left eye: Secondary | ICD-10-CM | POA: Diagnosis not present

## 2022-11-02 DIAGNOSIS — N210 Calculus in bladder: Secondary | ICD-10-CM | POA: Diagnosis not present

## 2022-11-02 DIAGNOSIS — H43811 Vitreous degeneration, right eye: Secondary | ICD-10-CM | POA: Diagnosis not present

## 2022-11-02 DIAGNOSIS — H33322 Round hole, left eye: Secondary | ICD-10-CM | POA: Diagnosis not present

## 2022-11-07 DIAGNOSIS — C8400 Mycosis fungoides, unspecified site: Secondary | ICD-10-CM | POA: Diagnosis not present

## 2022-11-07 NOTE — Unmapped (Signed)
[facility] Refill Coordination Note    Specialty Medication(s) to be Shipped:   Hematology/Oncology: Pegasys 180 mcg/mL    Other medication(s) to be shipped:  Syringe     Gregory Garrett, DOB: 1955-10-17  Phone: [phone] (home)       All above HIPAA information was verified with patient.     Was a Nurse, learning disability used for this call? No    Completed refill call assessment today to schedule patient's medication shipment from the [facility] [phone]).  All relevant notes have been reviewed.     Specialty medication(s) and dose(s) confirmed: Regimen is correct and unchanged.   Changes to medications: [name] reports starting the following medications: Soriatne  Changes to insurance: No  New side effects reported not previously addressed with a pharmacist or physician: None reported  Questions for the pharmacist: No    Confirmed patient received a Conservation officer, historic buildings and a Surveyor, mining with first shipment. The patient will receive a drug information handout for each medication shipped and additional FDA Medication Guides as required.       DISEASE/MEDICATION-SPECIFIC INFORMATION        For patients on injectable medications: Patient currently has 1 doses left.  Next injection is scheduled for 11/09/22.    SPECIALTY MEDICATION ADHERENCE     Medication Adherence    Patient reported X missed doses in the last month: 0  Specialty Medication: Pegasys 180 mcg/mL  Patient is on additional specialty medications: No  Informant: patient                       Were doses missed due to medication being on hold? No    Pegasys 180 mcg/mL: 1 days of medicine on hand       REFERRAL TO PHARMACIST     Referral to the pharmacist: Not needed      [facility]     Shipping address confirmed in Epic.     Delivery Scheduled: Yes, Expected medication delivery date: 11/14/22.     Medication will be delivered via UPS to the prescription address in [employer] [place].    Gregory Garrett   [facility] Specialty Technician

## 2022-11-07 NOTE — Progress Notes (Signed)
11/08/22- 63 yoM former smoker for sleep evaluation per Salvadore Oxford with concern of OSA. Had sleep study 4 yrs ago- unknown provider. Medical problem list includes DVT R leg, HTN, OSA, Prostate Cancer, Kidney Stone, Mycosis Fungoides (cutaneous T-cell lymphoma) Epworth score-10 Body weight today-206 lbs Covid vax- Flu vax- -----Pt currently uses cpap machine, states he had a sleep study 4 years ago Earnest Bailey has pt scheduled for HST 11/08/22 He describes loud snoring, witnessed apneas and daytime tiredness.  Denies history of ENT surgery, heart or lung disease.  Has been using CPAP but strongly interested in alternatives.  He does not remember where his original diagnostic study was done and there is no report available in our system.  He had tried an oral appliance and kept spitting it out.  He has met with Dr. Johnnette Gourd and would like to go forward with [facility] but needs a diagnostic sleep study.  Testing process was discussed.  Prior to Admission medications   Medication Sig Start Date End Date Taking? Authorizing Provider  acitretin (SORIATANE) 10 MG capsule Take 25 mg by mouth daily before breakfast.   Yes [provider]  amLODipine-valsartan (EXFORGE) 5-160 MG tablet Take 1 tablet by mouth daily. 08/04/20  Yes [provider]  citalopram (CELEXA) 40 MG tablet Take 40 mg by mouth at bedtime.   Yes [provider]  cyanocobalamin (VITAMIN B12) 500 MCG tablet Take 500 mcg by mouth daily.   Yes [provider]  milk thistle 175 MG tablet Take 175 mg by mouth in the morning and at bedtime.    Yes [provider]  peginterferon alfa-2a (PEGASYS) 180 MCG/ML injection Inject 180 mcg into the skin every 7 (seven) days. 06/16/20  Yes [provider]  Psyllium 52.3 % POWD Take 1 each by mouth as needed. metamucil   Yes [provider]  Red Yeast Rice Extract 600 MG CAPS Take 1 capsule by mouth in the morning and at bedtime.    Yes [provider]  triamcinolone cream (KENALOG) 0.1 % Apply topically See admin instructions. prn 03/18/17  Yes [provider]  oxybutynin (DITROPAN) 5 MG tablet Take 1 tablet (5 mg total) by mouth every 8 (eight) hours as needed for up to 15 doses for bladder spasms. 03/19/22   Franchot Gallo, MD  rivaroxaban (XARELTO) 20 MG TABS tablet Take 20 mg by mouth daily with supper.    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   ctcl cutaneous t cell lymphoma   Prostate cancer ([place]) 2017   Rosacea    Past Surgical History:  Procedure Laterality Date   CHOLECYSTECTOMY  1994 or1995   CYSTOSCOPY W/ RETROGRADES Left 01/20/2017   Procedure: CYSTOSCOPY WITH RETROGRADE PYELOGRAM, LEFT STENT PLACEMENT(STRING OFF);  Surgeon: Raynelle Bring, MD;  [facility];  Service: Urology;  Laterality: Left;   CYSTOSCOPY WITH LITHOLAPAXY N/A 03/19/2022   Procedure: CYSTOSCOPY WITH LITHOLAPAXY;  Surgeon: Franchot Gallo, MD;  [facility];  Service: Urology;  Laterality: N/A;   CYSTOSCOPY WITH RETROGRADE PYELOGRAM, URETEROSCOPY AND STENT PLACEMENT Left 01/17/2017   Procedure: CYSTOSCOPY WITH RETROGRADE PYELOGRAM, URETEROSCOPY, HOLMIUM LASER AND  LEFT STENT PLACEMENT;  Surgeon: Franchot Gallo, MD;  Location: [facility];  Service: Urology;  Laterality: Left;   HOLMIUM LASER APPLICATION N/A 05/18/9146   Procedure: HOLMIUM LASER APPLICATION;  Surgeon: Franchot Gallo, MD;  Location: [facility];  Service: Urology;  Laterality: N/A;   PENILE PROSTHESIS IMPLANT N/A 06/23/2020   Procedure: PENILE PROTHESIS INFLATABLE;  Surgeon: Franchot Gallo, MD;  Location: [facility];  Service: Urology;   Laterality: N/A;  2 HRS   PENILE PROSTHESIS IMPLANT N/A 11/14/2020   Procedure: Excision of prosthesis resevoir, Closure of bladder defect, Suprapubic tube placement;  Surgeon: Franchot Gallo, MD;  Location: [facility];  Service: Urology;  Laterality: N/A;   PENILE PROSTHESIS IMPLANT N/A 02/23/2021   Procedure: PENILE PROTHESIS RESERVOIR;  Surgeon: Franchot Gallo, MD;  Location: [facility];  Service: Urology;  Laterality: N/A;  [place] SURGERY  2017   radical prostectomy @ [facility]-   receiving chronic interferon      TONSILLECTOMY  as child   adenoids removed   URETERAL STENT PLACEMENT     wisdom  teeth extraction  1990   Family History  Problem Relation Age of Onset   Stroke Father    Hypertension Father    Head & neck cancer Father    Hypertension Mother    Skin cancer Mother    Prostate cancer Paternal Uncle        prostate ca   Colon cancer Maternal Grandmother    Breast cancer Neg Hx    Social History   Socioeconomic History   Marital status: Married    Spouse name: Not on file   Number of children: Not on file   Years of education: Not on file   Highest education level: Not on file  Occupational History   Not on file  Tobacco Use   Smoking status: Former    Packs/day: 0.50    Years: 10.00    Total pack years: 5.00    Types: Cigarettes    Quit date: 10/08/2014    Years since quitting: 8.0   Smokeless tobacco: Never  Vaping Use   Vaping Use: Never used  Substance and Sexual Activity   Alcohol use: Yes    Comment: 3-4  per day   Drug use: Yes    Types: Marijuana    Comment: last marijuana use  edible cbd 03-12-2022   Sexual activity: Yes  Other Topics Concern   Not on file  Social History Narrative   Patient is a medical supplies salesman. He resides with his wife in [place]. They have two children; one resides in [place] the other in [place].   05-29-18   Unable to ask abuse questions wife with him today.   Social Determinants of  Health   Financial Resource Strain: Not on file  Food Insecurity: Not on file  Transportation Needs: Not on file  Physical Activity: Not on file  Stress: Not on file  Social Connections: Not on file  Intimate Partner Violence: Not on file   ROS-see HPI   + = positive Constitutional:    weight loss, night sweats, fevers, chills, fatigue, lassitude. HEENT:    headaches, difficulty swallowing, tooth/dental problems, sore throat,       sneezing, itching, ear ache, nasal congestion, post nasal drip, snoring CV:    chest pain, orthopnea, PND, swelling in lower extremities, anasarca,  dizziness, palpitations Resp:   shortness of breath with exertion or at rest.                productive cough,   non-productive cough, coughing up of blood.              change in color of mucus.  wheezing.   Skin:    rash or lesions. GI:  No-   heartburn, indigestion, abdominal pain, nausea, vomiting, diarrhea,                 change in bowel habits, loss of appetite GU: dysuria, change in color of urine, no urgency or frequency.   flank pain. MS:   joint pain, stiffness, decreased range of motion, back pain. Neuro-     nothing unusual Psych:  change in mood or affect.  depression or anxiety.   memory loss.  OBJ- Physical Exam General- Alert, Oriented, Affect-appropriate, Distress- none acute Skin- rash-none, lesions- none, excoriation- none Lymphadenopathy- none Head- atraumatic            Eyes- Gross vision intact, PERRLA, conjunctivae and secretions clear            Ears- Hearing, canals-normal            Nose- Clear, no-Septal dev, mucus, polyps, erosion, perforation             Throat- Mallampati III , mucosa clear , drainage- none, tonsils- atrophic, +teeth Neck- flexible , trachea midline, no stridor , thyroid nl, carotid no bruit Chest - symmetrical excursion , unlabored           Heart/CV- RRR , no murmur , no gallop  , no rub, nl s1 s2                           -  JVD- none , edema- none, stasis changes- none, varices- none           Lung- clear to P&A, wheeze- none, cough- none , dullness-none, rub- none           Chest wall-  Abd-  Br/ Gen/ Rectal- Not done, not indicated Extrem- cyanosis- none, clubbing, none, atrophy- none, strength- nl Neuro- grossly intact to observation

## 2022-11-08 ENCOUNTER — Ambulatory Visit (INDEPENDENT_AMBULATORY_CARE_PROVIDER_SITE_OTHER): Payer: BC Managed Care – PPO | Admitting: Internal Medicine

## 2022-11-08 ENCOUNTER — Encounter: Payer: Self-pay | Admitting: Internal Medicine

## 2022-11-08 VITALS — BP 124/78 | HR 86 | Ht 72.0 in | Wt 206.6 lb

## 2022-11-08 DIAGNOSIS — G4733 Obstructive sleep apnea (adult) (pediatric): Secondary | ICD-10-CM

## 2022-11-08 DIAGNOSIS — I82411 Acute embolism and thrombosis of right femoral vein: Secondary | ICD-10-CM | POA: Diagnosis not present

## 2022-11-08 NOTE — Patient Instructions (Signed)
Order- schedule home sleep test dx OSA  Please call in 2 weeks for results and recommendations   Order- referral to ENT Dr [name]     consider Dawna Part

## 2022-11-09 ENCOUNTER — Encounter: Payer: Self-pay | Admitting: Internal Medicine

## 2022-11-09 DIAGNOSIS — G4733 Obstructive sleep apnea (adult) (pediatric): Secondary | ICD-10-CM | POA: Insufficient documentation

## 2022-11-09 NOTE — Assessment & Plan Note (Signed)
We have been unable to identify a source of his original diagnostic sleep study or his DME company.  Treatment options discussed. Plan-schedule sleep study.  If appropriate we can refer back to Dr. Redmond Baseman to proceed with [facility]

## 2022-11-09 NOTE — Assessment & Plan Note (Signed)
He denies recurrence

## 2022-11-13 DIAGNOSIS — C8400 Mycosis fungoides, unspecified site: Secondary | ICD-10-CM | POA: Diagnosis not present

## 2022-11-13 DIAGNOSIS — Z85828 Personal history of other malignant neoplasm of skin: Secondary | ICD-10-CM | POA: Diagnosis not present

## 2022-11-13 DIAGNOSIS — Z85820 Personal history of malignant melanoma of skin: Secondary | ICD-10-CM | POA: Diagnosis not present

## 2022-11-13 DIAGNOSIS — L821 Other seborrheic keratosis: Secondary | ICD-10-CM | POA: Diagnosis not present

## 2022-11-13 DIAGNOSIS — L820 Inflamed seborrheic keratosis: Secondary | ICD-10-CM | POA: Diagnosis not present

## 2022-11-13 MED FILL — BD TUBERCULIN SYRINGE 1 ML 21 GAUGE X 1": 28 days supply | Qty: 4 | Fill #0

## 2022-11-13 MED FILL — PEGASYS 180 MCG/ML SUBCUTANEOUS SOLUTION: SUBCUTANEOUS | 28 days supply | Qty: 4 | Fill #1

## 2022-11-15 DIAGNOSIS — Z6827 Body mass index (BMI) 27.0-27.9, adult: Secondary | ICD-10-CM | POA: Diagnosis not present

## 2022-11-15 DIAGNOSIS — G4733 Obstructive sleep apnea (adult) (pediatric): Secondary | ICD-10-CM | POA: Diagnosis not present

## 2022-11-18 DIAGNOSIS — G4733 Obstructive sleep apnea (adult) (pediatric): Secondary | ICD-10-CM | POA: Diagnosis not present

## 2022-11-26 ENCOUNTER — Other Ambulatory Visit: Payer: Self-pay | Admitting: Otolaryngology

## 2022-11-26 ENCOUNTER — Other Ambulatory Visit: Payer: Self-pay

## 2022-11-26 ENCOUNTER — Encounter (HOSPITAL_BASED_OUTPATIENT_CLINIC_OR_DEPARTMENT_OTHER): Payer: Self-pay | Admitting: Otolaryngology

## 2022-11-27 ENCOUNTER — Ambulatory Visit (HOSPITAL_BASED_OUTPATIENT_CLINIC_OR_DEPARTMENT_OTHER): Payer: BC Managed Care – PPO | Admitting: Certified Registered"

## 2022-11-27 ENCOUNTER — Encounter (HOSPITAL_BASED_OUTPATIENT_CLINIC_OR_DEPARTMENT_OTHER)
Admission: RE | Disposition: A | Discharge status: Home / Self Care | Payer: Self-pay | Source: Home / Self Care | Attending: Otolaryngology

## 2022-11-27 ENCOUNTER — Other Ambulatory Visit: Payer: Self-pay

## 2022-11-27 ENCOUNTER — Ambulatory Visit (HOSPITAL_BASED_OUTPATIENT_CLINIC_OR_DEPARTMENT_OTHER)
Admission: RE | Admit: 2022-11-27 | Discharge: 2022-11-27 | Disposition: A | Discharge status: Home / Self Care | Payer: BC Managed Care – PPO | Attending: Otolaryngology | Admitting: Otolaryngology

## 2022-11-27 ENCOUNTER — Encounter (HOSPITAL_BASED_OUTPATIENT_CLINIC_OR_DEPARTMENT_OTHER): Payer: Self-pay | Admitting: Otolaryngology

## 2022-11-27 DIAGNOSIS — G4733 Obstructive sleep apnea (adult) (pediatric): Secondary | ICD-10-CM | POA: Diagnosis not present

## 2022-11-27 DIAGNOSIS — Z87891 Personal history of nicotine dependence: Secondary | ICD-10-CM | POA: Insufficient documentation

## 2022-11-27 DIAGNOSIS — I10 Essential (primary) hypertension: Secondary | ICD-10-CM | POA: Insufficient documentation

## 2022-11-27 DIAGNOSIS — Z9989 Dependence on other enabling machines and devices: Secondary | ICD-10-CM | POA: Diagnosis not present

## 2022-11-27 DIAGNOSIS — Z01818 Encounter for other preprocedural examination: Secondary | ICD-10-CM

## 2022-11-27 HISTORY — PX: DRUG INDUCED ENDOSCOPY: SHX6808

## 2022-11-27 SURGERY — DRUG INDUCED SLEEP ENDOSCOPY
Anesthesia: General | Site: Nose | Laterality: Bilateral

## 2022-11-27 MED ORDER — IBUPROFEN 100 MG/5ML PO SUSP: 200.0000 mg | Freq: Four times a day (QID) | ORAL | Status: DC | PRN

## 2022-11-27 MED ORDER — OXYCODONE HCL 5 MG PO TABS: 5.0000 mg | ORAL_TABLET | Freq: Once | ORAL | Status: DC | PRN

## 2022-11-27 MED ORDER — LIDOCAINE 2% (20 MG/ML) 5 ML SYRINGE
INTRAMUSCULAR | Status: DC | PRN
  Administered 2022-11-27: 30 mg via INTRAVENOUS

## 2022-11-27 MED ORDER — IBUPROFEN 200 MG PO TABS: 200.0000 mg | ORAL_TABLET | Freq: Four times a day (QID) | ORAL | Status: DC | PRN

## 2022-11-27 MED ORDER — OXYCODONE HCL 5 MG/5ML PO SOLN: 5.0000 mg | Freq: Once | ORAL | Status: DC | PRN

## 2022-11-27 MED ORDER — PROPOFOL 500 MG/50ML IV EMUL
INTRAVENOUS | Status: DC | PRN
  Administered 2022-11-27: 45 ug/kg/min via INTRAVENOUS

## 2022-11-27 MED ORDER — OXYMETAZOLINE HCL 0.05 % NA SOLN
NASAL | Status: DC | PRN
  Administered 2022-11-27: 1 via TOPICAL

## 2022-11-27 MED ORDER — OXYMETAZOLINE HCL 0.05 % NA SOLN
NASAL | Status: AC
  Filled 2022-11-27: qty 30

## 2022-11-27 MED ORDER — LACTATED RINGERS IV SOLN: INTRAVENOUS | Status: DC

## 2022-11-27 MED ORDER — ONDANSETRON HCL 4 MG/2ML IJ SOLN
INTRAMUSCULAR | Status: DC | PRN
  Administered 2022-11-27: 4 mg via INTRAVENOUS

## 2022-11-27 MED ORDER — FENTANYL CITRATE (PF) 100 MCG/2ML IJ SOLN: 25.0000 ug | INTRAMUSCULAR | Status: DC | PRN

## 2022-11-27 SURGICAL SUPPLY — 13 items
CANISTER SUCT 1200ML W/VALVE (MISCELLANEOUS) ×1 IMPLANT
GLOVE BIO SURGEON STRL SZ7.5 (GLOVE) ×1 IMPLANT
GLOVE BIOGEL PI IND STRL 6.5 (GLOVE) IMPLANT
GLOVE BIOGEL PI IND STRL 7.0 (GLOVE) IMPLANT
KIT CLEAN ENDO (MISCELLANEOUS) ×1 IMPLANT
NDL HYPO 27GX1-1/4 (NEEDLE) IMPLANT
NEEDLE HYPO 27GX1-1/4 (NEEDLE) IMPLANT
PATTIES SURGICAL .5 X3 (DISPOSABLE) ×1 IMPLANT
SHEET MEDIUM DRAPE 40X70 STRL (DRAPES) ×1 IMPLANT
SOL ANTI FOG 6CC (MISCELLANEOUS) ×1 IMPLANT
SYR CONTROL 10ML LL (SYRINGE) IMPLANT
TOWEL GREEN STERILE FF (TOWEL DISPOSABLE) ×1 IMPLANT
TUBE CONNECTING 20X1/4 (TUBING) ×1 IMPLANT

## 2022-11-27 NOTE — Discharge Instructions (Signed)

## 2022-11-27 NOTE — Anesthesia Preprocedure Evaluation (Signed)
Anesthesia Evaluation    Airway Mallampati: I       Dental no notable dental hx.    Pulmonary sleep apnea and Continuous Positive Airway Pressure Ventilation , former smoker   Pulmonary exam normal        Cardiovascular hypertension, Pt. on medications Normal cardiovascular exam     Neuro/Psych negative neurological ROS  negative psych ROS   GI/Hepatic negative GI ROS, Neg liver ROS,,Patient received Oral Contrast Agents,  Endo/Other  negative endocrine ROS    Renal/GU negative Renal ROS     Musculoskeletal negative musculoskeletal ROS (+)    Abdominal Normal abdominal exam  (+)   Peds  Hematology negative hematology ROS (+)   Anesthesia Other Findings   Reproductive/Obstetrics                             Anesthesia Physical Anesthesia Plan  ASA: 2  Anesthesia Plan: General   Post-op Pain Management: Minimal or no pain anticipated   Induction: Intravenous  PONV Risk Score and Plan: 2 and Propofol infusion and TIVA  Airway Management Planned: Natural Airway and Nasal Cannula  Additional Equipment: None  Intra-op Plan:   Post-operative Plan:   Informed Consent: I have reviewed the patients History and Physical, chart, labs and discussed the procedure including the risks, benefits and alternatives for the proposed anesthesia with the patient or authorized representative who has indicated his/her understanding and acceptance.       Plan Discussed with: CRNA  Anesthesia Plan Comments:        Anesthesia Quick Evaluation

## 2022-11-27 NOTE — Anesthesia Postprocedure Evaluation (Signed)
Anesthesia Post Note  Patient: Kevin [name]  Procedure(s) Performed: DRUG INDUCED ENDOSCOPY (Bilateral: Nose)     Patient location during evaluation: Phase II Anesthesia Type: General Level of consciousness: awake Pain management: pain level controlled Vital Signs Assessment: post-procedure vital signs reviewed and stable Respiratory status: spontaneous breathing Cardiovascular status: stable Postop Assessment: no apparent nausea or vomiting Anesthetic complications: no  No notable events documented.  Last Vitals:  Vitals:   11/27/22 1236 11/27/22 1415  BP: (!) 134/94 (!) 122/94  Pulse: (!) 58 64  Resp:  20  Temp: 36.6 C (!) 36.4 C  SpO2: 100% 98%    Last Pain:  Vitals:   11/27/22 1415  TempSrc:   PainSc: 0-No pain                 Huston Foley

## 2022-11-27 NOTE — Brief Op Note (Signed)
11/27/2022  2:10 PM  PATIENT:  Kevin [name]  67 y.o. male  PRE-OPERATIVE DIAGNOSIS:  Obstructive Sleep Apnea BMI 27.0-27.9,adult  POST-OPERATIVE DIAGNOSIS:  Obstructive Sleep ApneaBMI 27.0-27.9,adult  PROCEDURE:  Procedure(s): DRUG INDUCED ENDOSCOPY (Bilateral)  SURGEON:  Surgeon(s) and Role:    [name], MD - Primary  PHYSICIAN ASSISTANT:   ASSISTANTS: none   ANESTHESIA:   IV sedation  EBL:  None   BLOOD ADMINISTERED:none  DRAINS: none   LOCAL MEDICATIONS USED:  NONE  SPECIMEN:  No Specimen  DISPOSITION OF SPECIMEN:  N/A  COUNTS:  YES  TOURNIQUET:  * No tourniquets in log *  DICTATION: .Note written in EPIC  PLAN OF CARE: Discharge to home after PACU  PATIENT DISPOSITION:  PACU - hemodynamically stable.   Delay start of Pharmacological VTE agent (>24hrs) due to surgical blood loss or risk of bleeding: no

## 2022-11-27 NOTE — H&P (Signed)
Kevin [name] is an 67 y.o. male.   Chief Complaint: Sleep apnea HPI: 67 year old male with obstructive sleep apnea who has been unable to tolerate CPAP.  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   ctcl cutaneous t cell lymphoma   Prostate cancer ([place]) 2017   Rosacea     Past Surgical History:  Procedure Laterality Date   CHOLECYSTECTOMY  1994 or1995   CYSTOSCOPY W/ RETROGRADES Left 01/20/2017   Procedure: CYSTOSCOPY WITH RETROGRADE PYELOGRAM, LEFT STENT PLACEMENT(STRING OFF);  Surgeon: Raynelle Bring, MD;  [facility];  Service: Urology;  Laterality: Left;   CYSTOSCOPY WITH LITHOLAPAXY N/A 03/19/2022   Procedure: CYSTOSCOPY WITH LITHOLAPAXY;  Surgeon: Franchot Gallo, MD;  Location: [facility];  Service: Urology;  Laterality: N/A;   CYSTOSCOPY WITH RETROGRADE PYELOGRAM, URETEROSCOPY AND STENT PLACEMENT Left 01/17/2017   Procedure: CYSTOSCOPY WITH RETROGRADE PYELOGRAM, URETEROSCOPY, HOLMIUM LASER AND  LEFT STENT PLACEMENT;  Surgeon: Franchot Gallo, MD;  Location: [facility];  Service: Urology;  Laterality: Left;   HOLMIUM LASER APPLICATION N/A [id number]   Procedure: HOLMIUM LASER APPLICATION;  Surgeon: Franchot Gallo, MD;  Location: [facility];  Service: Urology;  Laterality: N/A;   PENILE PROSTHESIS IMPLANT N/A 06/23/2020   Procedure: PENILE PROTHESIS INFLATABLE;  Surgeon: Franchot Gallo, MD;  Location: [facility];  Service: Urology;  Laterality: N/A;  2 HRS   PENILE PROSTHESIS IMPLANT N/A 11/14/2020   Procedure: Excision of prosthesis resevoir, Closure of bladder defect, Suprapubic tube placement;  Surgeon: Franchot Gallo, MD;  [facility];  Service: Urology;  Laterality: N/A;   PENILE  PROSTHESIS IMPLANT N/A 02/23/2021   Procedure: PENILE PROTHESIS RESERVOIR;  Surgeon: Franchot Gallo, MD;  Location: [facility];  Service: Urology;  Laterality: N/A;  [address] [place] SURGERY  2017   radical prostectomy @ [facility]-   receiving chronic interferon      TONSILLECTOMY  as child   adenoids removed   URETERAL STENT PLACEMENT     wisdom  teeth extraction  1990    Family History  Problem Relation Age of Onset   Stroke Father    Hypertension Father    Head & neck cancer Father    Hypertension Mother    Skin cancer Mother    Prostate cancer Paternal Uncle        prostate ca   Colon cancer Maternal Grandmother    Breast cancer Neg Hx    Social History:  reports that he quit smoking about 8 years ago. His smoking use included cigarettes. He has a 5.00 pack-year smoking history. He has never used smokeless tobacco. He reports current alcohol use. He reports current drug use. Drug: Marijuana.  Allergies:  Allergies  Allergen Reactions   Lisinopril     Other reaction(s): cough, ED    Medications Prior to Admission  Medication Sig Dispense Refill   amLODipine-valsartan (EXFORGE) 5-160 MG tablet Take 1 tablet by mouth daily.     citalopram (CELEXA) 40 MG tablet Take 40 mg by mouth at bedtime.     cyanocobalamin (VITAMIN B12) 500 MCG tablet Take 500 mcg by mouth daily.     milk thistle 175 MG tablet Take 175  mg by mouth in the morning and at bedtime.      Red Yeast Rice Extract 600 MG CAPS Take 1 capsule by mouth in the morning and at bedtime.      rivaroxaban (XARELTO) 10 MG TABS tablet Take 10 mg by mouth daily.     acitretin (SORIATANE) 10 MG capsule Take 25 mg by mouth daily before breakfast.     Psyllium 52.3 % POWD Take 1 each by mouth as needed. metamucil     triamcinolone cream (KENALOG) 0.1 % Apply topically See admin instructions. prn      No results found for this or any previous visit (from the past 48 hour(s)). No results found.  Review of  Systems  All other systems reviewed and are negative.   Blood pressure (!) 134/94, pulse (!) 58, temperature 97.9 F (36.6 C), temperature source Oral, height 6' (1.829 m), weight 92.5 kg, SpO2 100 %. Physical Exam Constitutional:      Appearance: Normal appearance. He is normal weight.  HENT:     Head: Normocephalic and atraumatic.     Right Ear: External ear normal.     Left Ear: External ear normal.     Nose: Nose normal.     Mouth/Throat:     Mouth: Mucous membranes are moist.     Pharynx: Oropharynx is clear.  Eyes:     Extraocular Movements: Extraocular movements intact.     Conjunctiva/sclera: Conjunctivae normal.     Pupils: Pupils are equal, round, and reactive to light.  Cardiovascular:     Rate and Rhythm: Normal rate.  Pulmonary:     Effort: Pulmonary effort is normal.  Musculoskeletal:     Cervical back: Normal range of motion.  Skin:    General: Skin is warm and dry.  Neurological:     General: No focal deficit present.     Mental Status: He is alert and oriented to person, place, and time.  Psychiatric:        Mood and Affect: Mood normal.        Behavior: Behavior normal.        Thought Content: Thought content normal.        Judgment: Judgment normal.      Assessment/Plan Obstructive sleep apnea and BMI 27.66.  To OR for sleep endoscopy.  [name], MD 11/27/2022, 1:06 PM

## 2022-11-27 NOTE — Op Note (Signed)
Preop diagnosis: Obstructive sleep apnea Postop diagnosis: same Procedure: Drug-induced sleep endoscopy Surgeon: Redmond Baseman Anesth: IV sedation Compl: None Findings: There is 75% anterior-posterior and 25% side wall collapse at the velum making him a candidate for hypoglossal nerve stimulator placement.  There was also some side wall collapse at the tongue base. Description:  After discussing risks, benefits, and alternatives, the patient was brought to the operative suite and placed on the operative table in the supine position.  Anesthesia was induced and the patient was given light sedation to simulate natural sleep. When the proper level was reached, an Afrin-soaked pledget was placed in the right nasal passage for a couple of minutes and then removed.  The fiberoptic laryngoscope was then passed to view the pharynx and larynx.  Findings are noted above and the exam was recorded.  After completion, the scope was removed and the patient was returned to anesthesia for wakeup and was moved to the recovery room in stable condition.

## 2022-11-27 NOTE — Transfer of Care (Signed)
Immediate Anesthesia Transfer of Care Note  Patient: Kevin [name]  Procedure(s) Performed: DRUG INDUCED ENDOSCOPY (Bilateral: Nose)  Patient Location: PACU  Anesthesia Type:MAC  Level of Consciousness: awake, alert , oriented, and patient cooperative  Airway & Oxygen Therapy: Patient Spontanous Breathing  Post-op Assessment: Report given to RN and Post -op Vital signs reviewed and stable  Post vital signs: Reviewed and stable  Last Vitals:  Vitals Value Taken Time  BP    Temp    Pulse 64 11/27/22 1416  Resp    SpO2 98 % 11/27/22 1416  Vitals shown include unvalidated device data.  Last Pain:  Vitals:   11/27/22 1236  TempSrc: Oral  PainSc: 0-No pain      Patients Stated Pain Goal: 3 ([phone] [id number])  Complications: No notable events documented.

## 2022-11-27 NOTE — Anesthesia Procedure Notes (Signed)
Procedure Name: MAC Date/Time: 11/27/2022 2:03 PM  Performed by: Jenell Dobransky, Ernesta Amble, CRNAPre-anesthesia Checklist: Patient identified, Emergency Drugs available, Suction available, Patient being monitored and Timeout performed Patient Re-evaluated:Patient Re-evaluated prior to induction Oxygen Delivery Method: Simple face mask

## 2022-11-28 ENCOUNTER — Encounter (HOSPITAL_BASED_OUTPATIENT_CLINIC_OR_DEPARTMENT_OTHER): Payer: Self-pay | Admitting: Otolaryngology

## 2022-11-29 DIAGNOSIS — E538 Deficiency of other specified B group vitamins: Secondary | ICD-10-CM | POA: Diagnosis not present

## 2022-11-29 DIAGNOSIS — I10 Essential (primary) hypertension: Secondary | ICD-10-CM | POA: Diagnosis not present

## 2022-11-29 DIAGNOSIS — Z5181 Encounter for therapeutic drug level monitoring: Secondary | ICD-10-CM | POA: Diagnosis not present

## 2022-11-30 NOTE — Unmapped (Signed)
[facility] Refill Coordination Note    Specialty Medication(s) to be Shipped:   Infectious Disease: Pegasys    Other medication(s) to be shipped:  syringes      Gregory Garrett, DOB: January 15, 1956  Phone: [phone] (home)       All above HIPAA information was verified with patient.     Was a Nurse, learning disability used for this call? No    Completed refill call assessment today to schedule patient's medication shipment from the [facility] [phone]).  All relevant notes have been reviewed.     Specialty medication(s) and dose(s) confirmed: Regimen is correct and unchanged.   Changes to medications: Dionne reports no changes at this time.  Changes to insurance: No  New side effects reported not previously addressed with a pharmacist or physician: None reported  Questions for the pharmacist: No    Confirmed patient received a Conservation officer, historic buildings and a Surveyor, mining with first shipment. The patient will receive a drug information handout for each medication shipped and additional FDA Medication Guides as required.       DISEASE/MEDICATION-SPECIFIC INFORMATION        For patients on injectable medications: Patient currently has 1 doses left.  Next injection is scheduled for 11/30/22.    SPECIALTY MEDICATION ADHERENCE     Medication Adherence    Patient reported X missed doses in the last month: 0  Specialty Medication: Pegasys 180 mcg/mL  Patient is on additional specialty medications: No  Informant: patient              Were doses missed due to medication being on hold? No        REFERRAL TO PHARMACIST     Referral to the pharmacist: Not needed      [facility]     Shipping address confirmed in Epic.     Patient was notified of new phone menu : No    Delivery Scheduled: Yes, Expected medication delivery date: 12/07/22.     Medication will be delivered via UPS to the prescription address in Epic [place].    Quintella Reichert   [facility] Specialty Technician

## 2022-12-06 DIAGNOSIS — C84A0 Cutaneous T-cell lymphoma, unspecified, unspecified site: Principal | ICD-10-CM

## 2022-12-07 MED FILL — PEGASYS 180 MCG/ML SUBCUTANEOUS SOLUTION: SUBCUTANEOUS | 28 days supply | Qty: 4 | Fill #2

## 2022-12-07 MED FILL — BD TUBERCULIN SYRINGE 1 ML 21 GAUGE X 1": 28 days supply | Qty: 4 | Fill #1

## 2022-12-12 ENCOUNTER — Other Ambulatory Visit: Payer: Self-pay | Admitting: Otolaryngology

## 2022-12-13 ENCOUNTER — Other Ambulatory Visit: Payer: Self-pay

## 2022-12-13 ENCOUNTER — Encounter (HOSPITAL_COMMUNITY): Payer: Self-pay | Admitting: Otolaryngology

## 2022-12-13 NOTE — Progress Notes (Signed)
Spoke with pt for pre-op call. Pt is on Xarelto for Chronic DVT. He states he was not instructed to hold it prior to surgery, but states he will hold it tonight. I told him that I would be calling Dr. Redmond Baseman' office and would let him know what Dr. Redmond Baseman has said.   Shower instructions given to pt and he voiced understanding.

## 2022-12-14 ENCOUNTER — Ambulatory Visit (HOSPITAL_COMMUNITY): Admission: RE | Admit: 2022-12-14 | Payer: BC Managed Care – PPO | Source: Ambulatory Visit | Admitting: Otolaryngology

## 2022-12-14 SURGERY — INSERTION, HYPOGLOSSAL NERVE STIMULATOR
Anesthesia: General | Laterality: Right

## 2022-12-19 DIAGNOSIS — H33322 Round hole, left eye: Secondary | ICD-10-CM | POA: Diagnosis not present

## 2022-12-19 DIAGNOSIS — H4311 Vitreous hemorrhage, right eye: Secondary | ICD-10-CM | POA: Diagnosis not present

## 2022-12-19 DIAGNOSIS — H43811 Vitreous degeneration, right eye: Secondary | ICD-10-CM | POA: Diagnosis not present

## 2022-12-19 DIAGNOSIS — H43822 Vitreomacular adhesion, left eye: Secondary | ICD-10-CM | POA: Diagnosis not present

## 2022-12-26 ENCOUNTER — Other Ambulatory Visit: Payer: Self-pay | Admitting: Otolaryngology

## 2023-01-01 NOTE — Unmapped (Signed)
[facility] Refill Coordination Note    Specialty Medication(s) to be Shipped:   Hematology/Oncology: Pegasys 180 mcg/mL    Other medication(s) to be shipped:  Syringe     Gregory Garrett, DOB: 1956-07-30  Phone: [phone] (home)       All above HIPAA information was verified with patient.     Was a Nurse, learning disability used for this call? No    Completed refill call assessment today to schedule patient's medication shipment from the [facility] [phone]).  All relevant notes have been reviewed.     Specialty medication(s) and dose(s) confirmed: Regimen is correct and unchanged.   Changes to medications: Ofir reports no changes at this time.  Changes to insurance: No  New side effects reported not previously addressed with a pharmacist or physician: None reported  Questions for the pharmacist: No    Confirmed patient received a Conservation officer, historic buildings and a Surveyor, mining with first shipment. The patient will receive a drug information handout for each medication shipped and additional FDA Medication Guides as required.       DISEASE/MEDICATION-SPECIFIC INFORMATION        For patients on injectable medications: Patient currently has 1 doses left.  Next injection is scheduled for 01/04/23.    SPECIALTY MEDICATION ADHERENCE     Medication Adherence    Patient reported X missed doses in the last month: 0  Specialty Medication: Pegasys 180 mcg/mL  Patient is on additional specialty medications: No  Informant: patient     Were doses missed due to medication being on hold? No    Pegasys 180 mcg/mL: 1 days of medicine on hand       REFERRAL TO PHARMACIST     Referral to the pharmacist: Not needed      [facility]     Shipping address confirmed in Epic.     Delivery Scheduled: Yes, Expected medication delivery date: 01/09/23.     Medication will be delivered via UPS to the prescription address in Epic [place].    [name] M Elisabeth Cara   [facility] Specialty Technician

## 2023-01-08 MED FILL — BD TUBERCULIN SYRINGE 1 ML 21 GAUGE X 1": 28 days supply | Qty: 4 | Fill #2

## 2023-01-08 MED FILL — PEGASYS 180 MCG/ML SUBCUTANEOUS SOLUTION: SUBCUTANEOUS | 28 days supply | Qty: 4 | Fill #3

## 2023-01-29 ENCOUNTER — Other Ambulatory Visit: Payer: Self-pay

## 2023-01-29 ENCOUNTER — Encounter (HOSPITAL_BASED_OUTPATIENT_CLINIC_OR_DEPARTMENT_OTHER): Payer: Self-pay | Admitting: Otolaryngology

## 2023-02-05 ENCOUNTER — Ambulatory Visit (HOSPITAL_BASED_OUTPATIENT_CLINIC_OR_DEPARTMENT_OTHER): Admission: RE | Admit: 2023-02-05 | Payer: BC Managed Care – PPO | Source: Home / Self Care | Admitting: Otolaryngology

## 2023-02-05 DIAGNOSIS — Z01818 Encounter for other preprocedural examination: Secondary | ICD-10-CM

## 2023-02-05 SURGERY — INSERTION, HYPOGLOSSAL NERVE STIMULATOR
Anesthesia: General | Laterality: Right

## 2023-02-05 NOTE — Unmapped (Signed)
[facility] Refill Coordination Note    Specialty Medication(s) to be Shipped:   Hematology/Oncology: Pegasys    Other medication(s) to be shipped:  Syringes     Gregory Garrett, DOB: 01-Jan-1956  Phone: [phone] (home)       All above HIPAA information was verified with patient.     Was a Nurse, learning disability used for this call? No    Completed refill call assessment today to schedule patient's medication shipment from the [facility] [phone]).  All relevant notes have been reviewed.     Specialty medication(s) and dose(s) confirmed: Regimen is correct and unchanged.   Changes to medications: Cady reports no changes at this time.  Changes to insurance: No  New side effects reported not previously addressed with a pharmacist or physician: None reported  Questions for the pharmacist: No    Confirmed patient received a Conservation officer, historic buildings and a Surveyor, mining with first shipment. The patient will receive a drug information handout for each medication shipped and additional FDA Medication Guides as required.       DISEASE/MEDICATION-SPECIFIC INFORMATION        For patients on injectable medications: Patient currently has 1 doses left.  Next injection is scheduled for 02/07/23.    SPECIALTY MEDICATION ADHERENCE     Medication Adherence    Patient reported X missed doses in the last month: 0  Specialty Medication: PEGASYS 180 mcg/mL  Patient is on additional specialty medications: No  Informant: patient              Were doses missed due to medication being on hold? No    Pegasys 180  mcg/ml : 9 days of medicine on hand       REFERRAL TO PHARMACIST     Referral to the pharmacist: Not needed      [facility]     Shipping address confirmed in Epic.       Delivery Scheduled: Yes, Expected medication delivery date: 02/07/23.     Medication will be delivered via UPS to the prescription address in Epic WAM.    Rashena Dowling Vangie Bicker, PharmD   [facility] Specialty Pharmacist

## 2023-02-06 MED FILL — BD TUBERCULIN SYRINGE 1 ML 21 GAUGE X 1": 28 days supply | Qty: 4 | Fill #3

## 2023-02-06 MED FILL — PEGASYS 180 MCG/ML SUBCUTANEOUS SOLUTION: SUBCUTANEOUS | 28 days supply | Qty: 4 | Fill #4

## 2023-03-06 MED ORDER — PEGASYS 180 MCG/ML SUBCUTANEOUS SOLUTION
SUBCUTANEOUS | 4 refills | 0 days
Start: 2023-03-06 — End: ?

## 2023-03-06 NOTE — Unmapped (Signed)
[facility] Refill Coordination Note    Specialty Medication(s) to be Shipped:   Hematology/Oncology: PEGASYS 180 mcg/mL injection (peginterferon alfa-2a)    Other medication(s) to be shipped: No additional medications requested for fill at this time     Gregory Garrett, DOB: 10-01-1956  Phone: [phone] (home)       All above HIPAA information was verified with patient.     Was a Nurse, learning disability used for this call? No    Completed refill call assessment today to schedule patient's medication shipment from the [facility] [phone]).  All relevant notes have been reviewed.     Specialty medication(s) and dose(s) confirmed: Regimen is correct and unchanged.   Changes to medications: [name] reports no changes at this time.  Changes to insurance: No  New side effects reported not previously addressed with a pharmacist or physician: None reported  Questions for the pharmacist: No    Confirmed patient received a Conservation officer, historic buildings and a Surveyor, mining with first shipment. The patient will receive a drug information handout for each medication shipped and additional FDA Medication Guides as required.       DISEASE/MEDICATION-SPECIFIC INFORMATION        For patients on injectable medications: Patient currently has 1 doses left.  Next injection is scheduled for 03/08/23.    SPECIALTY MEDICATION ADHERENCE     Medication Adherence    Patient reported X missed doses in the last month: 0  Specialty Medication: Pegasys 180 mcg/ml  Patient is on additional specialty medications: No  Patient is on more than two specialty medications: No  Any gaps in refill history greater than 2 weeks in the last 3 months: no  Demonstrates understanding of importance of adherence: yes  Informant: patient  Reliability of informant: reliable  Provider-estimated medication adherence level: good  Patient is at risk for Non-Adherence: No  Reasons for non-adherence: no problems identified  Confirmed plan for next specialty medication refill: delivery by pharmacy  Refills needed for supportive medications: not needed              Were doses missed due to medication being on hold? No    PEGASYS 180 mcg/mL injection (peginterferon alfa-2a)  : 7 days of medicine on hand       REFERRAL TO PHARMACIST     Referral to the pharmacist: Not needed      [facility]     Shipping address confirmed in Epic.       Delivery Scheduled: Yes, Expected medication delivery date: 03/11/23.  However, Rx request for refills was sent to the provider as there are none remaining.     Medication will be delivered via UPS to the prescription address in Epic WAM.    Gregory Garrett' W Danae [facility] Specialty Technician

## 2023-03-11 MED ORDER — PEGASYS 180 MCG/ML SUBCUTANEOUS SOLUTION
SUBCUTANEOUS | 4 refills | 0 days | Status: CN
Start: 2023-03-11 — End: ?

## 2023-03-11 NOTE — Unmapped (Signed)
Gregory Garrett 's Pegasys shipment will be delayed as a result of no refills remain on the prescription.      I have reached out to the patient  at [phone] and communicated the delay. We will call the patient back to reschedule the delivery upon resolution. We have not confirmed the new delivery date.

## 2023-03-12 ENCOUNTER — Encounter (HOSPITAL_BASED_OUTPATIENT_CLINIC_OR_DEPARTMENT_OTHER): Admission: RE | Payer: Self-pay | Source: Home / Self Care

## 2023-03-12 ENCOUNTER — Ambulatory Visit (HOSPITAL_BASED_OUTPATIENT_CLINIC_OR_DEPARTMENT_OTHER): Admission: RE | Admit: 2023-03-12 | Payer: BC Managed Care – PPO | Source: Home / Self Care | Admitting: Otolaryngology

## 2023-03-12 SURGERY — INSERTION, HYPOGLOSSAL NERVE STIMULATOR
Anesthesia: General | Laterality: Right

## 2023-03-14 MED ORDER — PEGASYS 180 MCG/ML SUBCUTANEOUS SOLUTION
SUBCUTANEOUS | 4 refills | 0 days
Start: 2023-03-14 — End: ?

## 2023-03-18 MED ORDER — PEGASYS 180 MCG/ML SUBCUTANEOUS SOLUTION
SUBCUTANEOUS | 4 refills | 0 days
Start: 2023-03-18 — End: ?

## 2023-03-19 NOTE — Unmapped (Signed)
Gregory Garrett 's Pegasys shipment will be canceled  as a result of no refills remain on the prescription.      I have reached out to the patient  at [phone] and communicated the delivery change. We will not reschedule the medication and have removed this/these medication(s) from the work request.  We have canceled this work request.     Many attempts to get refills. Informed pt on 6/3 no refills and pt has not answered again since.

## 2023-03-21 DIAGNOSIS — H43822 Vitreomacular adhesion, left eye: Secondary | ICD-10-CM | POA: Diagnosis not present

## 2023-03-21 DIAGNOSIS — H43811 Vitreous degeneration, right eye: Secondary | ICD-10-CM | POA: Diagnosis not present

## 2023-03-21 DIAGNOSIS — C61 Malignant neoplasm of prostate: Secondary | ICD-10-CM | POA: Diagnosis not present

## 2023-03-21 DIAGNOSIS — H4311 Vitreous hemorrhage, right eye: Secondary | ICD-10-CM | POA: Diagnosis not present

## 2023-03-21 DIAGNOSIS — H33322 Round hole, left eye: Secondary | ICD-10-CM | POA: Diagnosis not present

## 2023-03-21 DIAGNOSIS — Z8546 Personal history of malignant neoplasm of prostate: Secondary | ICD-10-CM | POA: Diagnosis not present

## 2023-03-26 MED ORDER — SYRINGE 3 ML 25 GAUGE X 1"
0 refills | 0 days
Start: 2023-03-26 — End: ?

## 2023-03-26 MED ORDER — PEGASYS 180 MCG/ML SUBCUTANEOUS SOLUTION
SUBCUTANEOUS | 4 refills | 35 days
Start: 2023-03-26 — End: ?

## 2023-03-26 NOTE — Unmapped (Signed)
Gregory Garrett 's Pegasys shipment will be sent out  as a result of a new prescription for the medication has been received.      I have reached out to the patient  and communicated the delivery change. We will reschedule the medication for the delivery date that the patient agreed upon.  We have confirmed the delivery date as 6/20, via ups.

## 2023-03-27 MED FILL — BD LUER-LOK SYRINGE 3 ML 25 GAUGE X 1": 84 days supply | Qty: 12 | Fill #0

## 2023-03-27 MED FILL — PEGASYS 180 MCG/ML SUBCUTANEOUS SOLUTION: SUBCUTANEOUS | 28 days supply | Qty: 4 | Fill #0

## 2023-04-22 NOTE — Unmapped (Signed)
[facility] Clinical Assessment & Refill Coordination Note    Gregory Garrett, DOB: 03/05/1956  Phone: [phone] (home)     All above HIPAA information was verified with patient.     Was a Nurse, learning disability used for this call? No    Specialty Medication(s):   Hematology/Oncology: Pegasys     Current Outpatient Medications   Medication Sig Dispense Refill    acetazolamide (DIAMOX) 250 MG tablet TAKE 1 TABLET BY MOUTH DAILY STARTING 1 DAY BEFORE TRAVEL TO HIGH ALTITUDE AND THEN FOR SEVERAL DAYS AFTER OBTAINING HIGHEST ALTITUDE 20 DAYS      amlodipine-valsartan (EXFORGE) 5-160 mg per tablet TAKE 1/2 TO 1 TABLET BY MOUTH EVERY DAY      citalopram (CELEXA) 40 MG tablet Take 1 tablet (40 mg total) by mouth daily.      empty container Misc Use to dispose of needles and syringes. 1 each 2    famotidine (PEPCID) 20 MG tablet Take 1 tablet (20 mg total) by mouth every evening.      peginterferon alfa-2a (PEGASYS) 180 mcg/mL injection Inject 0.75 mL (135 mcg total) under the skin once a week. Discard unused medication. 4 mL 4    syringe with needle (BD TUBERCULIN SYRINGE) 1 mL 21 gauge x 1 Syrg Use as directed to inject Pegasys. 50 each 1    syringe with needle (SYRINGE 3CC/25GX1) 3 mL 25 gauge x 1 Syrg Use as directed 100 each 0    XARELTO 20 mg tablet TAKE 1 TABLET BY MOUTH EVERY DAY WITH FOOD       No current facility-administered medications for this visit.        Changes to medications: Gregory Garrett reports no changes at this time.    Not on File    Changes to allergies: No    SPECIALTY MEDICATION ADHERENCE     Pegasys 10 mg/ml: 5 days of medicine on hand       Medication Adherence    Patient reported X missed doses in the last month: 0  Specialty Medication: Pegasys 180 mcg/ml  Informant: patient          Specialty medication(s) dose(s) confirmed: Regimen is correct and unchanged.     Are there any concerns with adherence? No    Adherence counseling provided? Not needed    CLINICAL MANAGEMENT AND INTERVENTION      Clinical Benefit Assessment:    Do you feel the medicine is effective or helping your condition? Yes    Clinical Benefit counseling provided? Not needed    Adverse Effects Assessment:    Are you experiencing any side effects? No    Are you experiencing difficulty administering your medicine? No    Quality of Life Assessment:    Quality of Life    Rheumatology  Oncology  Dermatology  Cystic Fibrosis          How many days over the past month did your condition  keep you from your normal activities? For example, brushing your teeth or getting up in the morning. Patient declined to answer    Have you discussed this with your provider? Not needed    Acute Infection Status:    Acute infections noted within Epic:  No active infections  Patient reported infection: None    Therapy Appropriateness:    Is therapy appropriate and patient progressing towards therapeutic goals? Yes, therapy is appropriate and should be continued    DISEASE/MEDICATION-SPECIFIC INFORMATION      For patients  on injectable medications: Patient currently has 0 doses left.  Next injection is scheduled for 04/26/23.    Oncology: Is the patient receiving adequate infection prevention treatment? Not applicable  Does the patient have adequate nutritional support? Not applicable    PATIENT SPECIFIC NEEDS     Does the patient have any physical, cognitive, or cultural barriers? No    Is the patient high risk? No    Did the patient require a clinical intervention? No    Does the patient require physician intervention or other additional services (i.e., nutrition, smoking cessation, social work)? No    SOCIAL DETERMINANTS OF HEALTH     At the [facility], we have learned that life circumstances - like trouble affording food, housing, utilities, or transportation can affect the health of many of our patients.   That is why we wanted to ask: are you currently experiencing any life circumstances that are negatively impacting your health and/or quality of life? No    Social Determinants of Psychologist, prison and probation services Strain: Not on file   Internet Connectivity: Not on file   Food Insecurity: Not on file   Tobacco Use: Not on file   Housing/Utilities: Not on file   Alcohol Use: Not on file   Transportation Needs: Not on file   Substance Use: Not on file   Health Literacy: Not on file   Physical Activity: Not on file   Interpersonal Safety: Unknown (04/22/2023)    Interpersonal Safety     Unsafe Where You Currently Live: Not on file     Physically Hurt by Anyone: Not on file     Abused by Anyone: Not on file   Stress: Not on file   Intimate Partner Violence: Not on file   Depression: Not on file   Social Connections: Not on file       Would you be willing to receive help with any of the needs that you have identified today? Not applicable       SHIPPING     Specialty Medication(s) to be Shipped:   Hematology/Oncology: Pegasys    Other medication(s) to be shipped: No additional medications requested for fill at this time     Changes to insurance: No    Delivery Scheduled: Yes, Expected medication delivery date: 04/24/23.     Medication will be delivered via UPS to the confirmed prescription address in [facility].    The patient will receive a drug information handout for each medication shipped and additional FDA Medication Guides as required.  Verified that patient has previously received a Conservation officer, historic buildings and a Surveyor, mining.    The patient or caregiver noted above participated in the development of this care plan and knows that they can request review of or adjustments to the care plan at any time.      All of the patient's questions and concerns have been addressed.    Elisa Kutner Vangie Bicker, PharmD   [facility] Specialty Pharmacist

## 2023-04-23 MED FILL — PEGASYS 180 MCG/ML SUBCUTANEOUS SOLUTION: SUBCUTANEOUS | 28 days supply | Qty: 4 | Fill #1

## 2023-05-07 DIAGNOSIS — C61 Malignant neoplasm of prostate: Secondary | ICD-10-CM | POA: Diagnosis not present

## 2023-05-10 ENCOUNTER — Ambulatory Visit: Payer: BC Managed Care – PPO | Admitting: Internal Medicine

## 2023-05-14 ENCOUNTER — Other Ambulatory Visit: Payer: Self-pay

## 2023-05-14 ENCOUNTER — Encounter (HOSPITAL_COMMUNITY): Payer: Self-pay | Admitting: Otolaryngology

## 2023-05-14 NOTE — Progress Notes (Signed)
SDW call  Patient was given pre-op instructions over the phone. Patient verbalized understanding of instructions provided.     PCP - Dr. Eleanora Neighbor Cardiologist -  Pulmonary:    PPM/ICD - denies Device Orders - n/a Rep Notified - n/a   Chest x-ray - n/a EKG -  DOS, 05/15/2023 Stress Test - ECHO -  Cardiac Cath -   Sleep Study/sleep apnea/CPAP: Diagnosed with sleep apnea, wears CPAP  Non-diabetic  Blood Thinner Instructions: Xarelto, states last dose 05/08/2023 Aspirin Instructions:denies   ERAS Protcol - Yes, clear fluids until 1230   COVID TEST- n/a     Anesthesia review: Yes. HTN, T-cell lymphonma, OSA, chronic interferon, DVT   Patient denies shortness of breath, fever, cough and chest pain over the phone call  Your procedure is scheduled on Wednesday May 16, 2023  Report to [facility] Main Entrance "A" at 1300 PM, then check in with the Admitting office.  Call this number if you have problems the morning of surgery:  [phone]   If you have any questions prior to your surgery date call [phone]: Open Monday-Friday 8am-4pm If you experience any cold or flu symptoms such as cough, fever, chills, shortness of breath, etc. between now and your scheduled surgery, please notify us at the above number     Remember:  Do not eat after midnight the night before your surgery  You may drink clear liquids until 1230 the day of your surgery.   Clear liquids allowed are: Water, Non-Citrus Juices (without pulp), Carbonated Beverages, Clear Tea, Black Coffee ONLY (NO MILK, CREAM OR POWDERED CREAMER of any kind), and Gatorade   Take these medicines as needed the morning of surgery with A SIP OF WATER:  Tylenol  As of today, STOP taking any Aspirin (unless otherwise instructed by your surgeon) Aleve, Naproxen, Ibuprofen, Motrin, Advil, Goody's, BC's, all herbal medications, fish oil, and all vitamins.

## 2023-05-14 NOTE — Progress Notes (Signed)
Spoke with the pt, he will arrive tom at 0940, NPO post midnight and will stop clear liquids at 0900.

## 2023-05-15 ENCOUNTER — Ambulatory Visit (HOSPITAL_COMMUNITY): Payer: BC Managed Care – PPO | Admitting: Physician Assistant

## 2023-05-15 ENCOUNTER — Other Ambulatory Visit: Payer: Self-pay

## 2023-05-15 ENCOUNTER — Encounter (HOSPITAL_COMMUNITY): Payer: Self-pay | Admitting: Otolaryngology

## 2023-05-15 ENCOUNTER — Ambulatory Visit (HOSPITAL_COMMUNITY): Payer: BC Managed Care – PPO

## 2023-05-15 ENCOUNTER — Ambulatory Visit (HOSPITAL_COMMUNITY)
Admission: RE | Admit: 2023-05-15 | Discharge: 2023-05-15 | Disposition: A | Discharge status: Home / Self Care | Payer: BC Managed Care – PPO | Attending: Otolaryngology | Admitting: Otolaryngology

## 2023-05-15 ENCOUNTER — Encounter (HOSPITAL_COMMUNITY)
Admission: RE | Disposition: A | Discharge status: Home / Self Care | Payer: Self-pay | Source: Home / Self Care | Attending: Otolaryngology

## 2023-05-15 DIAGNOSIS — Z8546 Personal history of malignant neoplasm of prostate: Secondary | ICD-10-CM | POA: Insufficient documentation

## 2023-05-15 DIAGNOSIS — Z86718 Personal history of other venous thrombosis and embolism: Secondary | ICD-10-CM | POA: Insufficient documentation

## 2023-05-15 DIAGNOSIS — Z79899 Other long term (current) drug therapy: Secondary | ICD-10-CM | POA: Insufficient documentation

## 2023-05-15 DIAGNOSIS — Z87891 Personal history of nicotine dependence: Secondary | ICD-10-CM | POA: Diagnosis not present

## 2023-05-15 DIAGNOSIS — Z6827 Body mass index (BMI) 27.0-27.9, adult: Secondary | ICD-10-CM | POA: Diagnosis not present

## 2023-05-15 DIAGNOSIS — G4733 Obstructive sleep apnea (adult) (pediatric): Secondary | ICD-10-CM | POA: Diagnosis not present

## 2023-05-15 DIAGNOSIS — I771 Stricture of artery: Secondary | ICD-10-CM | POA: Diagnosis not present

## 2023-05-15 DIAGNOSIS — I10 Essential (primary) hypertension: Secondary | ICD-10-CM | POA: Insufficient documentation

## 2023-05-15 DIAGNOSIS — Z7901 Long term (current) use of anticoagulants: Secondary | ICD-10-CM | POA: Insufficient documentation

## 2023-05-15 DIAGNOSIS — E663 Overweight: Secondary | ICD-10-CM | POA: Insufficient documentation

## 2023-05-15 HISTORY — PX: IMPLANTATION OF HYPOGLOSSAL NERVE STIMULATOR: SHX6827

## 2023-05-15 LAB — BASIC METABOLIC PANEL
Anion gap: 14 (ref 5–15)
BUN: 13 mg/dL (ref 8–23)
CO2: 20 mmol/L — ABNORMAL LOW (ref 22–32)
Calcium: 9.1 mg/dL (ref 8.9–10.3)
Chloride: 102 mmol/L (ref 98–111)
Creatinine, Ser: 0.91 mg/dL (ref 0.61–1.24)
GFR, Estimated: >60 mL/min (ref >60)
Glucose, Bld: 103 mg/dL — ABNORMAL HIGH (ref 70–99)
Potassium: 4.1 mmol/L (ref 3.5–5.1)
Sodium: 136 mmol/L (ref 135–145)

## 2023-05-15 LAB — CBC
HCT: 41.2 % (ref 39.0–52.0)
Hemoglobin: 14.2 g/dL (ref 13.0–17.0)
MCH: 31.8 pg (ref 26.0–34.0)
MCHC: 34.5 g/dL (ref 30.0–36.0)
MCV: 92.4 fL (ref 80.0–100.0)
Platelets: 128 10*3/uL — ABNORMAL LOW (ref 150–400)
RBC: 4.46 MIL/uL (ref 4.22–5.81)
RDW: 14 % (ref 11.5–15.5)
WBC: 4 10*3/uL (ref 4.0–10.5)
nRBC: 0 % (ref 0.0–0.2)

## 2023-05-15 SURGERY — INSERTION, HYPOGLOSSAL NERVE STIMULATOR
Anesthesia: General | Laterality: Right

## 2023-05-15 MED ORDER — MIDAZOLAM HCL 2 MG/2ML IJ SOLN
INTRAMUSCULAR | Status: AC
  Filled 2023-05-15: qty 2

## 2023-05-15 MED ORDER — ONDANSETRON HCL 4 MG/2ML IJ SOLN
INTRAMUSCULAR | Status: DC | PRN
Start: 2023-05-15 — End: 2023-05-15
  Administered 2023-05-15: 4 mg via INTRAVENOUS

## 2023-05-15 MED ORDER — 0.9 % SODIUM CHLORIDE (POUR BTL) OPTIME
TOPICAL | Status: DC | PRN
  Administered 2023-05-15: 1000 mL

## 2023-05-15 MED ORDER — ACETAMINOPHEN 500 MG PO TABS: 1000.0000 mg | ORAL_TABLET | Freq: Once | ORAL | Status: AC

## 2023-05-15 MED ORDER — DEXAMETHASONE SODIUM PHOSPHATE 10 MG/ML IJ SOLN
INTRAMUSCULAR | Status: AC
  Filled 2023-05-15: qty 1

## 2023-05-15 MED ORDER — DEXAMETHASONE SODIUM PHOSPHATE 10 MG/ML IJ SOLN
INTRAMUSCULAR | Status: DC | PRN
  Administered 2023-05-15: 10 mg via INTRAVENOUS

## 2023-05-15 MED ORDER — CHLORHEXIDINE GLUCONATE 0.12 % MT SOLN: 15.0000 mL | Freq: Once | OROMUCOSAL | Status: AC

## 2023-05-15 MED ORDER — LIDOCAINE 2% (20 MG/ML) 5 ML SYRINGE
INTRAMUSCULAR | Status: DC | PRN
  Administered 2023-05-15: 100 mg via INTRAVENOUS

## 2023-05-15 MED ORDER — PROPOFOL 500 MG/50ML IV EMUL
INTRAVENOUS | Status: DC | PRN
  Administered 2023-05-15: 25 ug/kg/min via INTRAVENOUS

## 2023-05-15 MED ORDER — FENTANYL CITRATE (PF) 100 MCG/2ML IJ SOLN
INTRAMUSCULAR | Status: AC
  Filled 2023-05-15: qty 2

## 2023-05-15 MED ORDER — ACETAMINOPHEN 500 MG PO TABS
ORAL_TABLET | ORAL | Status: AC
  Administered 2023-05-15: 1000 mg via ORAL
  Filled 2023-05-15: qty 2

## 2023-05-15 MED ORDER — MIDAZOLAM HCL 2 MG/2ML IJ SOLN
INTRAMUSCULAR | Status: DC | PRN
  Administered 2023-05-15: 2 mg via INTRAVENOUS

## 2023-05-15 MED ORDER — PHENYLEPHRINE HCL-NACL 20-0.9 MG/250ML-% IV SOLN
INTRAVENOUS | Status: DC | PRN
  Administered 2023-05-15: 40 ug/min via INTRAVENOUS

## 2023-05-15 MED ORDER — SUCCINYLCHOLINE CHLORIDE 200 MG/10ML IV SOSY
PREFILLED_SYRINGE | INTRAVENOUS | Status: DC | PRN
  Administered 2023-05-15: 100 mg via INTRAVENOUS

## 2023-05-15 MED ORDER — FENTANYL CITRATE (PF) 100 MCG/2ML IJ SOLN
25.0000 ug | INTRAMUSCULAR | Status: DC | PRN
  Administered 2023-05-15 (×4): 25 ug via INTRAVENOUS

## 2023-05-15 MED ORDER — LIDOCAINE-EPINEPHRINE 1 %-1:100000 IJ SOLN
INTRAMUSCULAR | Status: DC | PRN
  Administered 2023-05-15: 4.5 mL

## 2023-05-15 MED ORDER — LACTATED RINGERS IV SOLN: INTRAVENOUS | Status: DC

## 2023-05-15 MED ORDER — CHLORHEXIDINE GLUCONATE 0.12 % MT SOLN
OROMUCOSAL | Status: AC
  Administered 2023-05-15: 15 mL via OROMUCOSAL
  Filled 2023-05-15: qty 15

## 2023-05-15 MED ORDER — ORAL CARE MOUTH RINSE: 15.0000 mL | Freq: Once | OROMUCOSAL | Status: AC

## 2023-05-15 MED ORDER — HYDROCODONE-ACETAMINOPHEN 5-325 MG PO TABS: 1.0000 | ORAL_TABLET | Freq: Four times a day (QID) | ORAL | 0 refills | Status: DC | PRN

## 2023-05-15 MED ORDER — ONDANSETRON HCL 4 MG/2ML IJ SOLN: 4.0000 mg | Freq: Once | INTRAMUSCULAR | Status: DC | PRN

## 2023-05-15 MED ORDER — PROPOFOL 10 MG/ML IV BOLUS
INTRAVENOUS | Status: DC | PRN
Start: 2023-05-15 — End: 2023-05-15
  Administered 2023-05-15 (×2): 20 mg via INTRAVENOUS
  Administered 2023-05-15: 180 mg via INTRAVENOUS

## 2023-05-15 MED ORDER — LIDOCAINE-EPINEPHRINE 1 %-1:100000 IJ SOLN
INTRAMUSCULAR | Status: AC
  Filled 2023-05-15: qty 1

## 2023-05-15 MED ORDER — AMISULPRIDE (ANTIEMETIC) 5 MG/2ML IV SOLN: 10.0000 mg | Freq: Once | INTRAVENOUS | Status: DC | PRN

## 2023-05-15 MED ORDER — FENTANYL CITRATE (PF) 250 MCG/5ML IJ SOLN
INTRAMUSCULAR | Status: DC | PRN
  Administered 2023-05-15: 50 ug via INTRAVENOUS
  Administered 2023-05-15: 100 ug via INTRAVENOUS

## 2023-05-15 MED ORDER — ROCURONIUM BROMIDE 10 MG/ML (PF) SYRINGE
PREFILLED_SYRINGE | INTRAVENOUS | Status: AC
  Filled 2023-05-15: qty 10

## 2023-05-15 MED ORDER — FENTANYL CITRATE (PF) 250 MCG/5ML IJ SOLN
INTRAMUSCULAR | Status: AC
  Filled 2023-05-15: qty 5

## 2023-05-15 MED ORDER — PROPOFOL 10 MG/ML IV BOLUS
INTRAVENOUS | Status: AC
  Filled 2023-05-15: qty 20

## 2023-05-15 MED ORDER — LIDOCAINE 2% (20 MG/ML) 5 ML SYRINGE
INTRAMUSCULAR | Status: AC
  Filled 2023-05-15: qty 5

## 2023-05-15 MED ORDER — GLYCOPYRROLATE PF 0.2 MG/ML IJ SOSY
PREFILLED_SYRINGE | INTRAMUSCULAR | Status: DC | PRN
  Administered 2023-05-15 (×2): .1 mg via INTRAVENOUS

## 2023-05-15 MED ORDER — STERILE WATER FOR IRRIGATION IR SOLN
Status: DC | PRN
  Administered 2023-05-15: 1000 mL

## 2023-05-15 MED ORDER — ONDANSETRON HCL 4 MG/2ML IJ SOLN
INTRAMUSCULAR | Status: AC
  Filled 2023-05-15: qty 2

## 2023-05-15 SURGICAL SUPPLY — 75 items
ACC NRSTM 4 TRQ WRNCH STRL (MISCELLANEOUS)
ADH SKN CLS APL DERMABOND .7 (GAUZE/BANDAGES/DRESSINGS) ×2
BAG COUNTER SPONGE SURGICOUNT (BAG) ×1 IMPLANT
BAG SPNG CNTER NS LX DISP (BAG) ×1
BLADE CLIPPER SURG (BLADE) IMPLANT
BLADE SURG 15 STRL LF DISP TIS (BLADE) ×3 IMPLANT
BLADE SURG 15 STRL SS (BLADE) ×2
CANISTER SUCT 3000ML PPV (MISCELLANEOUS) ×1 IMPLANT
CORD BIPOLAR FORCEPS 12FT (ELECTRODE) ×1 IMPLANT
COVER PROBE W GEL 5X96 (DRAPES) ×1 IMPLANT
COVER SURGICAL LIGHT HANDLE (MISCELLANEOUS) ×1 IMPLANT
DERMABOND ADVANCED .7 DNX12 (GAUZE/BANDAGES/DRESSINGS) ×2 IMPLANT
DRAPE C-ARM 35X43 STRL (DRAPES) ×1 IMPLANT
DRAPE HEAD BAR (DRAPES) ×1 IMPLANT
DRAPE INCISE IOBAN 66X45 STRL (DRAPES) ×1 IMPLANT
DRAPE MICROSCOPE LEICA 54X105 (DRAPES) ×1 IMPLANT
DRAPE UTILITY XL STRL (DRAPES) ×1 IMPLANT
DRSG TEGADERM 4X4.75 (GAUZE/BANDAGES/DRESSINGS) ×3 IMPLANT
ELECT COATED BLADE 2.86 ST (ELECTRODE) ×1 IMPLANT
ELECT EMG 18 NIMS (NEUROSURGERY SUPPLIES) ×1
ELECT REM PT RETURN 9FT ADLT (ELECTROSURGICAL) ×1
ELECTRODE EMG 18 NIMS (NEUROSURGERY SUPPLIES) ×1 IMPLANT
ELECTRODE REM PT RTRN 9FT ADLT (ELECTROSURGICAL) ×1 IMPLANT
FORCEPS BIPOLAR SPETZLER 8 1.0 (NEUROSURGERY SUPPLIES) ×1 IMPLANT
GAUZE 4X4 16PLY ~~LOC~~+RFID DBL (SPONGE) ×1 IMPLANT
GAUZE SPONGE 4X4 12PLY STRL (GAUZE/BANDAGES/DRESSINGS) ×1 IMPLANT
GENERATOR PULSE INSPIRE (Generator) ×1 IMPLANT
GENERATOR PULSE INSPIRE IV (Generator) ×1 IMPLANT
GLOVE BIO SURGEON STRL SZ 6.5 (GLOVE) IMPLANT
GLOVE BIO SURGEON STRL SZ7.5 (GLOVE) ×1 IMPLANT
GOWN STRL REUS W/ TWL LRG LVL3 (GOWN DISPOSABLE) ×3 IMPLANT
GOWN STRL REUS W/TWL LRG LVL3 (GOWN DISPOSABLE) ×3
KIT BASIN OR (CUSTOM PROCEDURE TRAY) ×1 IMPLANT
KIT NEURO ACCESSORY W/WRENCH (MISCELLANEOUS) IMPLANT
KIT TURNOVER KIT B (KITS) ×1 IMPLANT
LEAD SENSING RESP INSPIRE (Lead) ×1 IMPLANT
LEAD SENSING RESP INSPIRE IV (Lead) ×1 IMPLANT
LEAD SLEEP STIM INSPIRE IV/V (Lead) ×1 IMPLANT
LEAD SLEEP STIMULATION INSPIRE (Lead) ×1 IMPLANT
LOOP VASCLR MAXI BLUE 18IN ST (MISCELLANEOUS) ×1 IMPLANT
LOOP VASCULAR MAXI 18 BLUE (MISCELLANEOUS) ×1
LOOP VASCULAR MINI 18 RED (MISCELLANEOUS) ×1
LOOPS VASCLR MAXI BLUE 18IN ST (MISCELLANEOUS) ×1 IMPLANT
MARKER SKIN DUAL TIP RULER LAB (MISCELLANEOUS) ×2 IMPLANT
NDL HYPO 25GX1X1/2 BEV (NEEDLE) ×1 IMPLANT
NEEDLE HYPO 25GX1X1/2 BEV (NEEDLE) ×1 IMPLANT
NS IRRIG 1000ML POUR BTL (IV SOLUTION) ×1 IMPLANT
PAD ARMBOARD 7.5X6 YLW CONV (MISCELLANEOUS) ×1 IMPLANT
PASSER CATH 38CM DISP (INSTRUMENTS) ×1 IMPLANT
PENCIL SMOKE EVACUATOR (MISCELLANEOUS) ×1 IMPLANT
POSITIONER HEAD DONUT 9IN (MISCELLANEOUS) ×1 IMPLANT
PROBE NERVE STIMULATOR (NEUROSURGERY SUPPLIES) ×1 IMPLANT
REMOTE CONTROL SLEEP INSPIRE (MISCELLANEOUS) ×1 IMPLANT
SET WALTER ACTIVATION W/DRAPE (SET/KITS/TRAYS/PACK) ×1 IMPLANT
SPONGE INTESTINAL PEANUT (DISPOSABLE) ×1 IMPLANT
STAPLER VISISTAT 35W (STAPLE) ×1 IMPLANT
SUT SILK 2 0 SH (SUTURE) ×1 IMPLANT
SUT SILK 3 0 (SUTURE) ×1
SUT SILK 3 0 REEL (SUTURE) ×1 IMPLANT
SUT SILK 3 0 SH 30 (SUTURE) ×2 IMPLANT
SUT SILK 3-0 (SUTURE) ×1
SUT SILK 3-0 18XBRD TIE 12 (SUTURE) IMPLANT
SUT SILK 3-0 RB1 30XBRD (SUTURE) ×1
SUT VIC AB 3-0 SH 27 (SUTURE) ×3
SUT VIC AB 3-0 SH 27X BRD (SUTURE) ×2 IMPLANT
SUT VIC AB 4-0 PS2 27 (SUTURE) ×2 IMPLANT
SUTURE SILK 3-0 RB1 30XBRD (SUTURE) ×1 IMPLANT
SYR 10ML LL (SYRINGE) ×1 IMPLANT
TAPE CLOTH 3X10 TAN LF (GAUZE/BANDAGES/DRESSINGS) IMPLANT
TAPE CLOTH SURG 4X10 WHT LF (GAUZE/BANDAGES/DRESSINGS) ×1 IMPLANT
TAPE PAPER 3X10 WHT MICROPORE (GAUZE/BANDAGES/DRESSINGS) IMPLANT
TOWEL GREEN STERILE (TOWEL DISPOSABLE) ×1 IMPLANT
TRAY ENT MC OR (CUSTOM PROCEDURE TRAY) ×1 IMPLANT
VASCULAR TIE MAXI BLUE 18IN ST (MISCELLANEOUS) ×1
VASCULAR TIE MINI RED 18IN STL (MISCELLANEOUS) ×1 IMPLANT

## 2023-05-15 NOTE — Unmapped (Signed)
[facility] Refill Coordination Note    Specialty Medication(s) to be Shipped:   PEGASYS 180 mcg/mL injection (peginterferon alfa-2a)    Other medication(s) to be shipped: No additional medications requested for fill at this time     Gregory Garrett, DOB: 12-16-1955  Phone: [phone] (home)       All above HIPAA information was verified with patient.     Was a Nurse, learning disability used for this call? No    Completed refill call assessment today to schedule patient's medication shipment from the [facility] [phone]).  All relevant notes have been reviewed.     Specialty medication(s) and dose(s) confirmed: Regimen is correct and unchanged.   Changes to medications: Anastasia reports no changes at this time.  Changes to insurance: No  New side effects reported not previously addressed with a pharmacist or physician: None reported  Questions for the pharmacist: No    Confirmed patient received a Conservation officer, historic buildings and a Surveyor, mining with first shipment. The patient will receive a drug information handout for each medication shipped and additional FDA Medication Guides as required.       DISEASE/MEDICATION-SPECIFIC INFORMATION        For patients on injectable medications: Patient currently has 1 doses left.  Next injection is scheduled for 05/16/2023.    SPECIALTY MEDICATION ADHERENCE     Medication Adherence    Patient reported X missed doses in the last month: 0  Specialty Medication: Pegasys 180 mcg/mL  Patient is on additional specialty medications: No  Informant: patient              Were doses missed due to medication being on hold? No    PEGASYS 180 mcg/mL injection (peginterferon alfa-2a): 7 days of medicine on hand     REFERRAL TO PHARMACIST     Referral to the pharmacist: Not needed      [facility]     Shipping address confirmed in Epic.       Delivery Scheduled: Yes, Expected medication delivery date: 05/21/2023.     Medication will be delivered via UPS to the prescription address in Epic WAM.    Dorisann Frames   [facility] Specialty Technician

## 2023-05-15 NOTE — Transfer of Care (Signed)
Immediate Anesthesia Transfer of Care Note  Patient: Kevin [name]  Procedure(s) Performed: IMPLANTATION OF HYPOGLOSSAL NERVE STIMULATOR (Right)  Patient Location: PACU  Anesthesia Type:General  Level of Consciousness: awake, alert , and oriented  Airway & Oxygen Therapy: Patient Spontanous Breathing  Post-op Assessment: Report given to RN, Post -op Vital signs reviewed and stable, and Patient moving all extremities X 4  Post vital signs: Reviewed and stable  Last Vitals:  Vitals Value Taken Time  BP 132/89 05/15/23 1317  Temp 36.1 C 05/15/23 1317  Pulse 76 05/15/23 1319  Resp 13 05/15/23 1319  SpO2 90 % 05/15/23 1319  Vitals shown include unfiled device data.  Last Pain:  Vitals:   05/15/23 0910  PainSc: 0-No pain         Complications: There were no known notable events for this encounter.

## 2023-05-15 NOTE — Brief Op Note (Signed)
05/15/2023  1:34 PM  PATIENT:  Kevin [name]  67 y.o. male  PRE-OPERATIVE DIAGNOSIS:  Obstructive sleep apnea BMI 27.0-27.9,adult  POST-OPERATIVE DIAGNOSIS:  same  PROCEDURE:  Procedure(s) with comments: IMPLANTATION OF HYPOGLOSSAL NERVE STIMULATOR (Right) - RNFA  SURGEON:  Surgeons and Role:    Christia Reading, MD - Primary  PHYSICIAN ASSISTANT:   ASSISTANTS: none   ANESTHESIA:   general  EBL:  50 mL   BLOOD ADMINISTERED:none  DRAINS: none   LOCAL MEDICATIONS USED:  LIDOCAINE   SPECIMEN:  No Specimen  DISPOSITION OF SPECIMEN:  N/A  COUNTS:  YES  TOURNIQUET:  * No tourniquets in log *  DICTATION: .Note written in EPIC  PLAN OF CARE: Discharge to home after PACU  PATIENT DISPOSITION:  PACU - hemodynamically stable.   Delay start of Pharmacological VTE agent (>24hrs) due to surgical blood loss or risk of bleeding: no

## 2023-05-15 NOTE — H&P (Signed)
Kevin [name] is an 67 y.o. male.   Chief Complaint: Sleep apnea HPI: 67 year old male with obstructive sleep apnea who has been unable to tolerate CPAP.  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   ctcl cutaneous t cell lymphoma   Prostate cancer (HCC) 2017   Rosacea     Past Surgical History:  Procedure Laterality Date   CHOLECYSTECTOMY  1994 or1995   CYSTOSCOPY W/ RETROGRADES Left 01/20/2017   Procedure: CYSTOSCOPY WITH RETROGRADE PYELOGRAM, LEFT STENT PLACEMENT(STRING OFF);  Surgeon: Heloise Purpura, MD;  Location: [facility];  Service: Urology;  Laterality: Left;   CYSTOSCOPY WITH LITHOLAPAXY N/A 03/19/2022   Procedure: CYSTOSCOPY WITH LITHOLAPAXY;  Surgeon: Marcine Matar, MD;  Location: [facility];  Service: Urology;  Laterality: N/A;   CYSTOSCOPY WITH RETROGRADE PYELOGRAM, URETEROSCOPY AND STENT PLACEMENT Left 01/17/2017   Procedure: CYSTOSCOPY WITH RETROGRADE PYELOGRAM, URETEROSCOPY, HOLMIUM LASER AND  LEFT STENT PLACEMENT;  Surgeon: Marcine Matar, MD;  Location: [facility] [place];  Service: Urology;  Laterality: Left;   DRUG INDUCED ENDOSCOPY Bilateral 11/27/2022   Procedure: DRUG INDUCED ENDOSCOPY;  Surgeon: Christia Reading, MD;  Location: [facility];  Service: ENT;  Laterality: Bilateral;   HOLMIUM LASER APPLICATION N/A 03/19/2022   Procedure: HOLMIUM LASER APPLICATION;  Surgeon: Marcine Matar, MD;  Location: [facility];  Service: Urology;  Laterality: N/A;   PENILE PROSTHESIS IMPLANT N/A 06/23/2020   Procedure: PENILE PROTHESIS INFLATABLE;  Surgeon: Marcine Matar, MD;  Location: [facility];  Service: Urology;  Laterality: N/A;  2 HRS   PENILE PROSTHESIS IMPLANT N/A 11/14/2020   Procedure:  Excision of prosthesis resevoir, Closure of bladder defect, Suprapubic tube placement;  Surgeon: Marcine Matar, MD;  Location: [facility];  Service: Urology;  Laterality: N/A;   PENILE PROSTHESIS IMPLANT N/A 02/23/2021   Procedure: PENILE PROTHESIS RESERVOIR;  Surgeon: Marcine Matar, MD;  Location: [facility];  Service: Urology;  Laterality: N/A;  75 MINS   PROSTATE SURGERY  2017   radical prostectomy @ [facility]-   receiving chronic interferon      TONSILLECTOMY  as child   adenoids removed   URETERAL STENT PLACEMENT     wisdom  teeth extraction  1990    Family History  Problem Relation Age of Onset   Stroke Father    Hypertension Father    Head & neck cancer Father    Hypertension Mother    Skin cancer Mother    Prostate cancer Paternal Uncle        prostate ca   Colon cancer Maternal Grandmother    Breast cancer Neg Hx    Social History:  reports that he quit smoking about 8 years ago. His smoking use included cigarettes. He started smoking about 18 years ago. He has a 5 pack-year smoking history. He has never used smokeless tobacco. He reports that he does not currently use alcohol. He reports current drug use. Drug: Marijuana.  Allergies:  Allergies  Allergen Reactions   Zestril [Lisinopril] Other (See Comments) and Cough     ED    Medications Prior to Admission  Medication Sig Dispense Refill   acetaminophen (TYLENOL) 500 MG tablet Take 500-1,000 mg by mouth  every 6 (six) hours as needed for moderate pain.     acitretin (SORIATANE) 25 MG capsule Take 25 mg by mouth every evening.     amLODipine-valsartan (EXFORGE) 5-160 MG tablet Take 1 tablet by mouth in the morning.     citalopram (CELEXA) 40 MG tablet Take 40 mg by mouth at bedtime.     Cyanocobalamin 5000 MCG CAPS Take 5,000 mcg by mouth every evening.     MILK THISTLE PO Take 2,000 mg by mouth in the morning and at bedtime.     PEGASYS 180 MCG/ML injection Inject 180 mcg into the skin every Friday.      Psyllium (METAMUCIL PO) Take 1 Dose by mouth daily.     Red Yeast Rice 600 MG CAPS Take 600 mg by mouth 2 (two) times daily.     rivaroxaban (XARELTO) 20 MG TABS tablet Take 20 mg by mouth every evening.     triamcinolone cream (KENALOG) 0.1 % Apply 1 Application topically every other day.      Results for orders placed or performed during the hospital encounter of 05/15/23 (from the past 48 hour(s))  CBC per protocol     Status: Abnormal   Collection Time: 05/15/23  8:56 AM  Result Value Ref Range   WBC 4.0 4.0 - 10.5 K/uL   RBC 4.46 4.22 - 5.81 MIL/uL   Hemoglobin 14.2 13.0 - 17.0 g/dL   HCT 16.1 09.6 - 04.5 %   MCV 92.4 80.0 - 100.0 fL   MCH 31.8 26.0 - 34.0 pg   MCHC 34.5 30.0 - 36.0 g/dL   RDW 40.9 81.1 - 91.4 %   Platelets 128 (L) 150 - 400 K/uL    Comment: REPEATED TO VERIFY   nRBC 0.0 0.0 - 0.2 %    Comment: Performed at [facility], [address] [place], [place] [address]   No results found.  Review of Systems  All other systems reviewed and are negative.   Blood pressure (!) 151/93, pulse 98, temperature 98.2 F (36.8 C), resp. rate 18, height 6' (1.829 m), weight 93 kg, SpO2 98%. Physical Exam Constitutional:      Appearance: Normal appearance. He is normal weight.  HENT:     Head: Normocephalic and atraumatic.     Right Ear: External ear normal.     Left Ear: External ear normal.     Nose: Nose normal.     Mouth/Throat:     Mouth: Mucous membranes are moist.     Pharynx: Oropharynx is clear.  Eyes:     Extraocular Movements: Extraocular movements intact.     Conjunctiva/sclera: Conjunctivae normal.     Pupils: Pupils are equal, round, and reactive to light.  Cardiovascular:     Rate and Rhythm: Normal rate.  Pulmonary:     Effort: Pulmonary effort is normal.  Musculoskeletal:     Cervical back: Normal range of motion.  Skin:    General: Skin is warm and dry.  Neurological:     General: No focal deficit present.     Mental Status: He  is alert and oriented to person, place, and time.  Psychiatric:        Mood and Affect: Mood normal.        Behavior: Behavior normal.        Thought Content: Thought content normal.        Judgment: Judgment normal.      Assessment/Plan Obstructive sleep apnea and BMI 27.80.  To OR for hypoglossal nerve stimulator placement.  Christia Reading, MD 05/15/2023, 10:48 AM

## 2023-05-15 NOTE — Anesthesia Preprocedure Evaluation (Addendum)
Anesthesia Evaluation  Patient identified by MRN, date of birth, ID band Patient awake    Reviewed: Allergy & Precautions, NPO status , Patient's Chart, lab work & pertinent test results  Airway Mallampati: III  TM Distance: >3 FB Neck ROM: Full    Dental  (+) Teeth Intact, Dental Advisory Given   Pulmonary sleep apnea , former smoker   Pulmonary exam normal breath sounds clear to auscultation       Cardiovascular hypertension, Pt. on medications + DVT  Normal cardiovascular exam Rhythm:Regular Rate:Normal     Neuro/Psych negative neurological ROS     GI/Hepatic negative GI ROS, Neg liver ROS,,,  Endo/Other  negative endocrine ROS    Renal/GU negative Renal ROS   Prostate cancer     Musculoskeletal negative musculoskeletal ROS (+)    Abdominal   Peds  Hematology  (+) Blood dyscrasia (Xarelto)   Anesthesia Other Findings Day of surgery medications reviewed with the patient.  Reproductive/Obstetrics                             Anesthesia Physical Anesthesia Plan  ASA: 3  Anesthesia Plan: General   Post-op Pain Management: Tylenol PO (pre-op)*   Induction: Intravenous  PONV Risk Score and Plan: 2 and Midazolam, Dexamethasone and Ondansetron  Airway Management Planned: Oral ETT  Additional Equipment:   Intra-op Plan:   Post-operative Plan: Extubation in OR  Informed Consent: I have reviewed the patients History and Physical, chart, labs and discussed the procedure including the risks, benefits and alternatives for the proposed anesthesia with the patient or authorized representative who has indicated his/her understanding and acceptance.     Dental advisory given  Plan Discussed with: CRNA  Anesthesia Plan Comments:         Anesthesia Quick Evaluation

## 2023-05-15 NOTE — Op Note (Signed)

## 2023-05-15 NOTE — Anesthesia Procedure Notes (Signed)
Procedure Name: Intubation Date/Time: 05/15/2023 11:24 AM  Performed by: Sharyn Dross, CRNAPre-anesthesia Checklist: Patient identified, Emergency Drugs available, Suction available and Patient being monitored Patient Re-evaluated:Patient Re-evaluated prior to induction Oxygen Delivery Method: Circle system utilized Preoxygenation: Pre-oxygenation with 100% oxygen Induction Type: IV induction Ventilation: Mask ventilation without difficulty and Oral airway inserted - appropriate to patient size Laryngoscope Size: Glidescope and 4 Grade View: Grade I Tube type: Oral Tube size: 7.5 mm Number of attempts: 1 Airway Equipment and Method: Stylet and Oral airway Placement Confirmation: ETT inserted through vocal cords under direct vision, positive ETCO2 and breath sounds checked- equal and bilateral Secured at: 23 cm Tube secured with: Tape Dental Injury: Teeth and Oropharynx as per pre-operative assessment

## 2023-05-16 ENCOUNTER — Encounter (HOSPITAL_COMMUNITY): Payer: Self-pay | Admitting: Otolaryngology

## 2023-05-16 NOTE — Anesthesia Postprocedure Evaluation (Signed)
Anesthesia Post Note  Patient: Kevin [name]  Procedure(s) Performed: IMPLANTATION OF HYPOGLOSSAL NERVE STIMULATOR (Right)     Patient location during evaluation: PACU Anesthesia Type: General Level of consciousness: awake and alert Pain management: pain level controlled Vital Signs Assessment: post-procedure vital signs reviewed and stable Respiratory status: spontaneous breathing, nonlabored ventilation, respiratory function stable and patient connected to nasal cannula oxygen Cardiovascular status: blood pressure returned to baseline and stable Postop Assessment: no apparent nausea or vomiting Anesthetic complications: no   There were no known notable events for this encounter.  Last Vitals:  Vitals:   05/15/23 1445 05/15/23 1456  BP: 123/88 (!) 130/90  Pulse: 70 69  Resp: 18 12  Temp:  (!) 36.2 C  SpO2: 96% 96%    Last Pain:  Vitals:   05/15/23 1317  PainSc: 0-No pain                 Collene Schlichter

## 2023-05-20 MED FILL — PEGASYS 180 MCG/ML SUBCUTANEOUS SOLUTION: SUBCUTANEOUS | 28 days supply | Qty: 4 | Fill #2

## 2023-05-22 ENCOUNTER — Telehealth: Payer: Self-pay | Admitting: Hematology and Oncology

## 2023-05-22 DIAGNOSIS — G4733 Obstructive sleep apnea (adult) (pediatric): Secondary | ICD-10-CM | POA: Diagnosis not present

## 2023-05-22 NOTE — Telephone Encounter (Signed)
Scheduled appointment per 8/14 staff message. Patient is aware of the made appointment.

## 2023-05-24 ENCOUNTER — Ambulatory Visit (HOSPITAL_COMMUNITY)
Admission: RE | Admit: 2023-05-24 | Discharge: 2023-05-24 | Disposition: A | Discharge status: Home / Self Care | Payer: BC Managed Care – PPO | Source: Ambulatory Visit | Attending: Internal Medicine | Admitting: Internal Medicine

## 2023-05-24 ENCOUNTER — Other Ambulatory Visit (HOSPITAL_COMMUNITY): Payer: Self-pay | Admitting: Internal Medicine

## 2023-05-24 DIAGNOSIS — M7989 Other specified soft tissue disorders: Secondary | ICD-10-CM | POA: Insufficient documentation

## 2023-05-24 DIAGNOSIS — M79604 Pain in right leg: Secondary | ICD-10-CM

## 2023-05-24 NOTE — Progress Notes (Signed)
Right lower extremity venous study completed.   Preliminary results left as a message with Dr. Donnamarie Poag MA.  Please see CV Procedures for preliminary results.  Ocean Kearley, RVT  1:54 PM 05/24/23

## 2023-06-04 ENCOUNTER — Telehealth: Payer: Self-pay | Admitting: Hematology and Oncology

## 2023-06-04 NOTE — Telephone Encounter (Signed)
Rescheduled appointments per provider PAL. Patient is aware of the changes made to his upcoming appointments.

## 2023-06-05 ENCOUNTER — Other Ambulatory Visit: Payer: Self-pay | Admitting: Adult Health

## 2023-06-05 DIAGNOSIS — I82411 Acute embolism and thrombosis of right femoral vein: Secondary | ICD-10-CM

## 2023-06-06 ENCOUNTER — Inpatient Hospital Stay (HOSPITAL_BASED_OUTPATIENT_CLINIC_OR_DEPARTMENT_OTHER): Payer: BC Managed Care – PPO | Admitting: Adult Health

## 2023-06-06 ENCOUNTER — Encounter: Payer: Self-pay | Admitting: Adult Health

## 2023-06-06 ENCOUNTER — Inpatient Hospital Stay: Payer: BC Managed Care – PPO | Attending: Adult Health

## 2023-06-06 VITALS — BP 132/83 | HR 74 | Temp 97.7°F | Resp 18 | Ht 72.0 in | Wt 198.5 lb

## 2023-06-06 DIAGNOSIS — Z87891 Personal history of nicotine dependence: Secondary | ICD-10-CM | POA: Diagnosis not present

## 2023-06-06 DIAGNOSIS — I82411 Acute embolism and thrombosis of right femoral vein: Secondary | ICD-10-CM

## 2023-06-06 DIAGNOSIS — Z8042 Family history of malignant neoplasm of prostate: Secondary | ICD-10-CM | POA: Insufficient documentation

## 2023-06-06 DIAGNOSIS — Z800 Family history of malignant neoplasm of digestive organs: Secondary | ICD-10-CM | POA: Diagnosis not present

## 2023-06-06 DIAGNOSIS — Z8546 Personal history of malignant neoplasm of prostate: Secondary | ICD-10-CM | POA: Insufficient documentation

## 2023-06-06 DIAGNOSIS — I82811 Embolism and thrombosis of superficial veins of right lower extremities: Secondary | ICD-10-CM

## 2023-06-06 DIAGNOSIS — Z7901 Long term (current) use of anticoagulants: Secondary | ICD-10-CM | POA: Insufficient documentation

## 2023-06-06 DIAGNOSIS — Z86718 Personal history of other venous thrombosis and embolism: Secondary | ICD-10-CM | POA: Diagnosis not present

## 2023-06-06 LAB — CBC WITH DIFFERENTIAL (CANCER CENTER ONLY)
Abs Immature Granulocytes: 0.02 10*3/uL (ref 0.00–0.07)
Basophils Absolute: 0 10*3/uL (ref 0.0–0.1)
Basophils Relative: 0 %
Eosinophils Absolute: 0.1 10*3/uL (ref 0.0–0.5)
Eosinophils Relative: 4 %
HCT: 38.6 % — ABNORMAL LOW (ref 39.0–52.0)
Hemoglobin: 13.5 g/dL (ref 13.0–17.0)
Immature Granulocytes: 1 %
Lymphocytes Relative: 18 %
Lymphs Abs: 0.7 10*3/uL (ref 0.7–4.0)
MCH: 32.4 pg (ref 26.0–34.0)
MCHC: 35 g/dL (ref 30.0–36.0)
MCV: 92.6 fL (ref 80.0–100.0)
Monocytes Absolute: 0.7 10*3/uL (ref 0.1–1.0)
Monocytes Relative: 18 %
Neutro Abs: 2.2 10*3/uL (ref 1.7–7.7)
Neutrophils Relative %: 59 %
Platelet Count: 143 10*3/uL — ABNORMAL LOW (ref 150–400)
RBC: 4.17 MIL/uL — ABNORMAL LOW (ref 4.22–5.81)
RDW: 13.6 % (ref 11.5–15.5)
WBC Count: 3.7 10*3/uL — ABNORMAL LOW (ref 4.0–10.5)
nRBC: 0 % (ref 0.0–0.2)

## 2023-06-06 LAB — CMP (CANCER CENTER ONLY)
ALT: 70 U/L — ABNORMAL HIGH (ref 0–44)
AST: 60 U/L — ABNORMAL HIGH (ref 15–41)
Albumin: 4.1 g/dL (ref 3.5–5.0)
Alkaline Phosphatase: 81 U/L (ref 38–126)
Anion gap: 6 (ref 5–15)
BUN: 15 mg/dL (ref 8–23)
CO2: 26 mmol/L (ref 22–32)
Calcium: 9.5 mg/dL (ref 8.9–10.3)
Chloride: 105 mmol/L (ref 98–111)
Creatinine: 1.12 mg/dL (ref 0.61–1.24)
GFR, Estimated: >60 mL/min (ref >60)
Glucose, Bld: 137 mg/dL — ABNORMAL HIGH (ref 70–99)
Potassium: 4.1 mmol/L (ref 3.5–5.1)
Sodium: 137 mmol/L (ref 135–145)
Total Bilirubin: 0.7 mg/dL (ref 0.3–1.2)
Total Protein: 7.1 g/dL (ref 6.5–8.1)

## 2023-06-06 NOTE — Unmapped (Signed)
[facility] Refill Coordination Note    Specialty Medication(s) to be Shipped:   Hematology/Oncology: Pegasys 180 mcg/ml    Other medication(s) to be shipped: No additional medications requested for fill at this time     Gregory Garrett, DOB: [birth year]  Phone: [phone] (home)       All above HIPAA information was verified with patient.     Was a Nurse, learning disability used for this call? No    Completed refill call assessment today to schedule patient's medication shipment from the [facility] [phone]).  All relevant notes have been reviewed.     Specialty medication(s) and dose(s) confirmed: Regimen is correct and unchanged.   Changes to medications: Gregory Garrett reports no changes at this time.  Changes to insurance: No  New side effects reported not previously addressed with a pharmacist or physician: None reported  Questions for the pharmacist: No    Confirmed patient received a Conservation officer, historic buildings and a Surveyor, mining with first shipment. The patient will receive a drug information handout for each medication shipped and additional FDA Medication Guides as required.       DISEASE/MEDICATION-SPECIFIC INFORMATION        For patients on injectable medications: Patient currently has 1 doses left.  Next injection is scheduled for 06/07/23.    SPECIALTY MEDICATION ADHERENCE     Medication Adherence    Patient reported X missed doses in the last month: 0  Specialty Medication: Pegasys 180 mcg/mL  Patient is on additional specialty medications: No  Informant: patient              Were doses missed due to medication being on hold? No        REFERRAL TO PHARMACIST     Referral to the pharmacist: Not needed      Gregory Garrett     Shipping address confirmed in Epic.       Delivery Scheduled: Yes, Expected medication delivery date: 06/13/23.     Medication will be delivered via UPS to the prescription address in Epic WAM.    Gregory Garrett   [facility] Specialty Technician

## 2023-06-06 NOTE — Progress Notes (Signed)
[facility] Cancer Follow up:    Kevin Aspen, MD [address] [place] [address]   DIAGNOSIS: DVT  SUMMARY OF HEMATOLOGIC HISTORY: History of blood clots in 2008 and in 2015 both occurring after long distance travel. History of 2 brothers with blood clots Right lower extremity ultrasound on November 13, 2021: Acute occlusive DVT of right femoral and popliteal veins.  Occurred after long distance flight from [place]. Xarelto anticoagulation began in February 2023 11/30/2022 Hypercoagulability work-up: Protein C: 44% (slightly deficient), protein S and Antithrombin: Normal; Lupus anticoagulant/antiphospholipid antibodies: Positive for lupus anticoagulant; Prothrombin gene mutation: Not detected; Factor V Leiden: Not detected   CURRENT THERAPY: Xarelto  INTERVAL HISTORY: Kevin [name] 67 y.o. male returns for follow-up of his coagulability and DVTs.  Please note his hematologic history above.    He typically takes Xarelto daily and he travels often when he travels he wears compression stockings and notes his right lower extremity has been unchanged during his travels.  He typically travels to [place] or internationally and does this about once a month.  Kevin [name] underwent hypoglossal nerve stimulator placement on August 7 with Dr. Jenne Pane.  In order to undergo this nerve stimulator placement he stopped Xarelto 5 days prior.  Resume Xarelto on August 8.  He noticed that leading up to his surgery he had some increased lower extremity swelling.  After his surgery the right lower extremity became a little bit more swollen and the cystic structure in his right lower inner leg became more indurated and erythematous.  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  He notes that his swelling in his leg is unchanged since resuming the Xarelto.  It is not worse.  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   ctcl cutaneous t cell lymphoma   Prostate cancer (HCC) 2017   Rosacea     SURGICAL HISTORY: Past Surgical History:  Procedure Laterality Date   CHOLECYSTECTOMY  1994 or1995   CYSTOSCOPY W/ RETROGRADES Left 01/20/2017   Procedure: CYSTOSCOPY WITH RETROGRADE PYELOGRAM, LEFT STENT PLACEMENT(STRING OFF);  Surgeon: Heloise Purpura, MD;  Location: [facility];  Service: Urology;  Laterality: Left;   CYSTOSCOPY WITH LITHOLAPAXY N/A 03/19/2022   Procedure: CYSTOSCOPY WITH LITHOLAPAXY;  Surgeon: Marcine Matar, MD;  Location: [facility];  Service: Urology;  Laterality: N/A;   CYSTOSCOPY WITH RETROGRADE PYELOGRAM, URETEROSCOPY AND STENT PLACEMENT Left 01/17/2017   Procedure: CYSTOSCOPY WITH RETROGRADE PYELOGRAM, URETEROSCOPY, HOLMIUM LASER AND  LEFT STENT PLACEMENT;  Surgeon: Marcine Matar, MD;  Location:  [facility];  Service: Urology;  Laterality: Left;   DRUG INDUCED ENDOSCOPY Bilateral 11/27/2022   Procedure: DRUG INDUCED ENDOSCOPY;  Surgeon: Christia Reading, MD;  Location: [facility];  Service: ENT;  Laterality: Bilateral;   HOLMIUM LASER APPLICATION N/A 03/19/2022   Procedure: HOLMIUM LASER APPLICATION;  Surgeon: Marcine Matar, MD;  Location: [facility];  Service: Urology;  Laterality: N/A;   IMPLANTATION OF HYPOGLOSSAL NERVE STIMULATOR Right 05/15/2023   Procedure: IMPLANTATION OF HYPOGLOSSAL NERVE STIMULATOR;  Surgeon: Christia Reading, MD;  Location: [facility];  Service: ENT;  Laterality: Right;  RNFA   PENILE PROSTHESIS IMPLANT N/A 06/23/2020   Procedure: PENILE PROTHESIS INFLATABLE;  Surgeon: Marcine Matar, MD;  Location: [facility];  Service: Urology;  Laterality: N/A;  2 HRS   PENILE PROSTHESIS IMPLANT N/A 11/14/2020   Procedure: Excision of prosthesis resevoir, Closure of bladder defect, Suprapubic tube placement;  Surgeon: Marcine Matar, MD;  Location: [facility];  Service: Urology;  Laterality: N/A;   PENILE PROSTHESIS IMPLANT N/A 02/23/2021   Procedure: PENILE PROTHESIS RESERVOIR;  Surgeon: Marcine Matar, MD;  Location: [facility];  Service: Urology;  Laterality: N/A;  75 MINS   PROSTATE SURGERY  2017   radical prostectomy @ [facility]-   receiving chronic interferon      TONSILLECTOMY  as child   adenoids removed   URETERAL STENT PLACEMENT     wisdom  teeth extraction  1990    SOCIAL HISTORY: Social History   Socioeconomic History   Marital status: Married    Spouse name: Not on file   Number of children: Not on file   Years of education: Not on file   Highest education level: Not on file  Occupational History   Not on file  Tobacco Use   Smoking status: Former    Current packs/day: 0.00    Average packs/day: 0.5 packs/day for 10.0 years (5.0 ttl pk-yrs)    Types: Cigarettes    Start date: 10/08/2004    Quit date: 10/08/2014    Years since quitting: 8.6   Smokeless tobacco: Never  Vaping Use   Vaping status: Never Used  Substance and Sexual Activity   Alcohol use: Not Currently     Comment: 3-4  per day, liquor   Drug use: Yes    Types: Marijuana    Comment: last marijuana use  edible cbd 05/13/2023   Sexual activity: Yes  Other Topics Concern   Not on file  Social History Narrative   Patient is a medical supplies salesman. He resides with his wife in [place]. They have two children; one resides in [place] the other in [place].   05-29-18   Unable to ask abuse questions wife with him today.   Social Determinants of Health   Financial Resource Strain: Not on file  Food Insecurity: Not on file  Transportation Needs: Not on file  Physical Activity: Not on file  Stress: Not on file  Social Connections: Not on file  Intimate Partner Violence: Not on file    FAMILY HISTORY: Family History  Problem Relation Age of Onset   Stroke Father    Hypertension Father    Head & neck cancer Father    Hypertension Mother    Skin cancer Mother    Prostate cancer Paternal Uncle        prostate ca   Colon cancer Maternal Grandmother    Breast cancer Neg Hx     Review of  Systems  Constitutional:  Negative for appetite change, chills, fatigue, fever and unexpected weight change.  HENT:   Negative for hearing loss, lump/mass and trouble swallowing.   Eyes:  Negative for eye problems and icterus.  Respiratory:  Negative for chest tightness, cough and shortness of breath.   Cardiovascular:  Negative for chest pain, leg swelling and palpitations.  Gastrointestinal:  Negative for abdominal distention, abdominal pain, constipation, diarrhea, nausea and vomiting.  Endocrine: Negative for hot flashes.  Genitourinary:  Negative for difficulty urinating.   Musculoskeletal:  Negative for arthralgias.  Skin:  Negative for itching and rash.  Neurological:  Negative for dizziness, extremity weakness, headaches and numbness.  Hematological:  Negative for adenopathy. Does not bruise/bleed easily.  Psychiatric/Behavioral:  Negative for depression. The patient is not nervous/anxious.        PHYSICAL EXAMINATION  Vitals:   06/06/23 1106  BP: 132/83  Pulse: 74  Resp: 18  Temp: 97.7 F (36.5 C)  SpO2: 100%    Physical Exam Constitutional:      General: He is not in acute distress.    Appearance: Normal appearance. He is not toxic-appearing.  HENT:     Head: Normocephalic and atraumatic.     Mouth/Throat:     Mouth: Mucous membranes are moist.     Pharynx: Oropharynx is clear. No oropharyngeal exudate or posterior oropharyngeal erythema.  Eyes:     General: No scleral icterus. Cardiovascular:     Rate and Rhythm: Normal rate and regular rhythm.     Pulses: Normal pulses.     Heart sounds: Normal heart sounds.  Pulmonary:     Effort: Pulmonary effort is normal.     Breath sounds: Normal breath sounds.  Abdominal:     General: Abdomen is flat. Bowel sounds are normal. There is no distension.     Palpations: Abdomen is soft.     Tenderness: There is no abdominal tenderness.  Musculoskeletal:        General: Swelling present.     Cervical back: Neck supple.  Lymphadenopathy:     Cervical: No cervical adenopathy.  Skin:    General: Skin is warm and dry.     Findings: No rash.     Comments: See picture below  Neurological:     General: No focal deficit present.     Mental Status: He is alert.  Psychiatric:        Mood and Affect: Mood normal.        Behavior: Behavior normal.     LABORATORY DATA:  CBC    Component Value Date/Time   WBC 3.7 (L) 06/06/2023 1056   WBC 4.0 05/15/2023 0856   RBC 4.17 (L) 06/06/2023 1056   HGB 13.5 06/06/2023 1056   HCT 38.6 (L) 06/06/2023 1056   PLT 143 (L) 06/06/2023 1056   MCV 92.6 06/06/2023 1056   MCH 32.4 06/06/2023 1056   MCHC 35.0 06/06/2023 1056   RDW 13.6 06/06/2023 1056   LYMPHSABS 0.7 06/06/2023 1056   MONOABS 0.7 06/06/2023 1056   EOSABS 0.1 06/06/2023 1056   BASOSABS 0.0 06/06/2023 1056   CMP     Component Value Date/Time   NA 137 06/06/2023 1056   K 4.1 06/06/2023 1056   CL 105 06/06/2023  1056   CO2 26 06/06/2023 1056   GLUCOSE 137 (H) 06/06/2023 1056   BUN 15 06/06/2023 1056   CREATININE 1.12 06/06/2023 1056   CALCIUM 9.5 06/06/2023 1056   PROT 7.1 06/06/2023 1056  ALBUMIN 4.1 06/06/2023 1056   AST 60 (H) 06/06/2023 1056   ALT 70 (H) 06/06/2023 1056   ALKPHOS 81 06/06/2023 1056   BILITOT 0.7 06/06/2023 1056   GFRNONAA >60 06/06/2023 1056   GFRAA >60 01/24/2017 0456      ASSESSMENT and THERAPY PLAN:   Right leg DVT (HCC) Kevin [name] is a 67 year old man with 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  Reviewed the above issue with Dr. Pamelia Hoit in detail.  We recommended the following.  Continue Xarelto 20 mg daily (lifelong anticoagulation is recommended) with compression stockings during plane rides For future surgeries please contact our office so we can discuss how to hold Xarelto and bridge with Lovenox. Referral to vascular surgery regarding his right lower extremity varicosities with the cystic structure. He will return annually for follow-up.   All questions were answered. The patient knows to call the clinic with any problems, questions or concerns. We can certainly see the patient much sooner if necessary.  Total encounter time:30 minutes*in face-to-face visit time, chart review, lab review, care coordination, order entry, and documentation of the encounter time.  Lillard Anes, NP 06/06/23 12:14 PM Medical Oncology and Hematology [facility] [address] [place], [place] [address] Tel. [phone]    Fax. [phone]  *Total Encounter Time as defined by the Centers for Medicare and Medicaid Services includes, in addition to the face-to-face time of a patient visit (documented in the note above) non-face-to-face time: obtaining and reviewing outside history, ordering and reviewing medications, tests or procedures, care  coordination (communications with other health care professionals or caregivers) and documentation in the medical record.

## 2023-06-06 NOTE — Assessment & Plan Note (Addendum)
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  Reviewed the above issue with Dr. Pamelia Hoit in detail.  We recommended the following.  Continue Xarelto 20 mg daily (lifelong anticoagulation is recommended) with compression stockings during plane rides For future surgeries please contact our office so we can discuss how to hold Xarelto and bridge with Lovenox. Referral to vascular surgery regarding his right lower extremity varicosities with the cystic structure. He will return annually for follow-up.

## 2023-06-07 ENCOUNTER — Ambulatory Visit: Payer: BC Managed Care – PPO | Admitting: Hematology and Oncology

## 2023-06-07 ENCOUNTER — Other Ambulatory Visit: Payer: BC Managed Care – PPO

## 2023-06-12 MED FILL — PEGASYS 180 MCG/ML SUBCUTANEOUS SOLUTION: SUBCUTANEOUS | 28 days supply | Qty: 4 | Fill #3

## 2023-06-12 MED FILL — BD LUER-LOK SYRINGE 3 ML 25 GAUGE X 1": 84 days supply | Qty: 12 | Fill #1

## 2023-06-13 ENCOUNTER — Ambulatory Visit: Payer: BC Managed Care – PPO | Admitting: Internal Medicine

## 2023-06-18 DIAGNOSIS — Z85820 Personal history of malignant melanoma of skin: Secondary | ICD-10-CM | POA: Diagnosis not present

## 2023-06-18 DIAGNOSIS — Z85828 Personal history of other malignant neoplasm of skin: Secondary | ICD-10-CM | POA: Diagnosis not present

## 2023-06-18 DIAGNOSIS — L821 Other seborrheic keratosis: Secondary | ICD-10-CM | POA: Diagnosis not present

## 2023-06-18 DIAGNOSIS — L905 Scar conditions and fibrosis of skin: Secondary | ICD-10-CM | POA: Diagnosis not present

## 2023-06-22 NOTE — Progress Notes (Signed)
33 yoM former smoker followed for OSA/ [facility] Medical problem list includes recurrent DVT R leg/ Lupus anticoagulant/Xarelto, HTN, OSA, Prostate Cancer, Kidney Stone, Mycosis Fungoides (cutaneous T-cell lymphoma) HST 11/08/22-AHI 11.5/hr, desat to 86%, body weight 210 lbs Dr Jenne Pane- [facility] Implanted 05/15/23-  ================================================= 11/08/22- 66 yoM former smoker for sleep evaluation per Darcel Smalling with concern of OSA. Had sleep study 4 yrs ago- unknown provider. Medical problem list includes DVT R leg, HTN, OSA, Prostate Cancer, Kidney Stone, Mycosis Fungoides (cutaneous T-cell lymphoma) Epworth score-10 Body weight today-206 lbs Covid vax- Flu vax- -----Pt currently uses cpap machine, states he had a sleep study 4 years ago Jeanice Lim has pt scheduled for HST 11/08/22 He describes loud snoring, witnessed apneas and daytime tiredness.  Denies history of ENT surgery, heart or lung disease.  Has been using CPAP but strongly interested in alternatives.  He does not remember where his original diagnostic study was done and there is no report available in our system.  He had tried an oral appliance and kept spitting it out.  He has met with Dr. Karlyn Agee and would like to go forward with [facility] but needs a diagnostic sleep study.  Testing process was discussed.  06/24/23- 55 yoM former smoker followed for OSA/ [facility]. Had sleep study 4 yrs ago- unknown provider. Medical problem list includes recurrent DVT R leg/ Lupus anticoagulant/Xarelto, HTN, OSA, Prostate Cancer, Kidney Stone, Mycosis Fungoides (cutaneous T-cell lymphoma), Limited Color Vision, HST 11/08/22-AHI 11.5/hr, desat to 86%, body weight 210 lbs Body weight today-199 lbs Dr Jenne Pane- [facility] Implanted 05/15/23-  Had recurrent DVT when Xarelto held for [facility] implantation. He is to wear compression stockings for travel, to see vascular surgery about venous disease R calf, and to contact Heme-Onc for Lovenox bridging  during future surgeries.   Inspire incisions well-healed, no pain. No longer wearing CPAP. [facility] tech is here. Starting at level 1 this week and advance to level 2 next week.  ROS-see HPI   + = positive Constitutional:    weight loss, night sweats, fevers, chills, fatigue, lassitude. HEENT:    headaches, difficulty swallowing, tooth/dental problems, sore throat,       sneezing, itching, ear ache, nasal congestion, post nasal drip, snoring CV:    chest pain, orthopnea, PND, swelling in lower extremities, anasarca,                                  dizziness, palpitations Resp:   shortness of breath with exertion or at rest.                productive cough,   non-productive cough, coughing up of blood.              change in color of mucus.  wheezing.   Skin:    rash or lesions. GI:  No-   heartburn, indigestion, abdominal pain, nausea, vomiting, diarrhea,                 change in bowel habits, loss of appetite GU: dysuria, change in color of urine, no urgency or frequency.   flank pain. MS:   joint pain, stiffness, decreased range of motion, back pain. Neuro-     nothing unusual Psych:  change in mood or affect.  depression or anxiety.   memory loss.  OBJ- Physical Exam General- Alert, Oriented, Affect-appropriate, Distress- none acute Skin- rash-none, lesions- none, excoriation- none Lymphadenopathy- none Head- atraumatic  Eyes- Gross vision intact, PERRLA, conjunctivae and secretions clear            Ears- Hearing, canals-normal            Nose- Clear, no-Septal dev, mucus, polyps, erosion, perforation             Throat- Mallampati III , mucosa clear , drainage- none, tonsils- atrophic, +teeth Neck- flexible , trachea midline, no stridor , thyroid nl, carotid no bruit Chest - symmetrical excursion , unlabored           Heart/CV- RRR , no murmur , no gallop  , no rub, nl s1 s2                           - JVD- none , edema- none, stasis changes- none, varices+             Lung- clear to P&A, wheeze- none, cough- none , dullness-none, rub- none           Chest wall-  Abd-  Br/ Gen/ Rectal- Not done, not indicated Extrem- + varix calf Neuro- grossly intact to observation

## 2023-06-24 ENCOUNTER — Encounter: Payer: Self-pay | Admitting: Internal Medicine

## 2023-06-24 ENCOUNTER — Ambulatory Visit: Payer: BC Managed Care – PPO | Admitting: Internal Medicine

## 2023-06-24 VITALS — BP 118/74 | HR 62 | Temp 97.2°F | Ht 72.0 in | Wt 199.8 lb

## 2023-06-24 DIAGNOSIS — I82401 Acute embolism and thrombosis of unspecified deep veins of right lower extremity: Secondary | ICD-10-CM | POA: Diagnosis not present

## 2023-06-24 DIAGNOSIS — G4733 Obstructive sleep apnea (adult) (pediatric): Secondary | ICD-10-CM

## 2023-06-24 NOTE — Patient Instructions (Signed)
Starting at level 1- you will advance by one level/ week, as tolerated comfortably. If it is too strong, drop back a level.

## 2023-06-27 DIAGNOSIS — R0989 Other specified symptoms and signs involving the circulatory and respiratory systems: Secondary | ICD-10-CM | POA: Diagnosis not present

## 2023-06-27 DIAGNOSIS — H9193 Unspecified hearing loss, bilateral: Secondary | ICD-10-CM | POA: Diagnosis not present

## 2023-06-27 DIAGNOSIS — H903 Sensorineural hearing loss, bilateral: Secondary | ICD-10-CM | POA: Insufficient documentation

## 2023-07-03 ENCOUNTER — Encounter: Payer: Self-pay | Admitting: Internal Medicine

## 2023-07-03 NOTE — Assessment & Plan Note (Signed)
Starting inspire adjustment now.

## 2023-07-03 NOTE — Assessment & Plan Note (Signed)
Recurrent DVT.  Lupus anticoagulant Lovenox if Xarelto has to be interrupted.

## 2023-07-05 NOTE — Unmapped (Signed)
[facility] Refill Coordination Note    Specialty Medication(s) to be Shipped:   Hematology/Oncology: Pegasys 180 mcg/mL    Other medication(s) to be shipped: No additional medications requested for fill at this time     Gregory Garrett, DOB: April 29, 1956  Phone: [phone] (home)       All above HIPAA information was verified with patient.     Was a Nurse, learning disability used for this call? No    Completed refill call assessment today to schedule patient's medication shipment from the [facility] [phone]).  All relevant notes have been reviewed.     Specialty medication(s) and dose(s) confirmed: Regimen is correct and unchanged.   Changes to medications: [name] reports no changes at this time.  Changes to insurance: No  New side effects reported not previously addressed with a pharmacist or physician: None reported  Questions for the pharmacist: No    Confirmed patient received a Conservation officer, historic buildings and a Surveyor, mining with first shipment. The patient will receive a drug information handout for each medication shipped and additional FDA Medication Guides as required.       DISEASE/MEDICATION-SPECIFIC INFORMATION        For patients on injectable medications: Patient currently has 1 doses left.  Next injection is scheduled for 07/12/23.    SPECIALTY MEDICATION ADHERENCE     Medication Adherence    Patient reported X missed doses in the last month: 0  Specialty Medication: Pegasys 180 mcg/mL  Patient is on additional specialty medications: No  Informant: patient     Were doses missed due to medication being on hold? No    Pegasys 180 mcg/mL: 1 days of medicine on hand       REFERRAL TO PHARMACIST     Referral to the pharmacist: Not needed      [facility]     Shipping address confirmed in Epic.     Delivery Scheduled: Yes, Expected medication delivery date: 07/16/23.     Medication will be delivered via UPS to the prescription address in Epic [place].    [name] M Elisabeth Cara   [facility] Specialty Technician

## 2023-07-09 ENCOUNTER — Encounter: Payer: Self-pay | Admitting: Internal Medicine

## 2023-07-10 DIAGNOSIS — Z23 Encounter for immunization: Secondary | ICD-10-CM | POA: Diagnosis not present

## 2023-07-10 DIAGNOSIS — Z0000 Encounter for general adult medical examination without abnormal findings: Secondary | ICD-10-CM | POA: Diagnosis not present

## 2023-07-11 DIAGNOSIS — E538 Deficiency of other specified B group vitamins: Secondary | ICD-10-CM | POA: Diagnosis not present

## 2023-07-11 DIAGNOSIS — I10 Essential (primary) hypertension: Secondary | ICD-10-CM | POA: Diagnosis not present

## 2023-07-11 DIAGNOSIS — E559 Vitamin D deficiency, unspecified: Secondary | ICD-10-CM | POA: Diagnosis not present

## 2023-07-11 DIAGNOSIS — E785 Hyperlipidemia, unspecified: Secondary | ICD-10-CM | POA: Diagnosis not present

## 2023-07-15 MED FILL — PEGASYS 180 MCG/ML SUBCUTANEOUS SOLUTION: SUBCUTANEOUS | 28 days supply | Qty: 4 | Fill #4

## 2023-07-16 DIAGNOSIS — N210 Calculus in bladder: Secondary | ICD-10-CM | POA: Diagnosis not present

## 2023-07-16 DIAGNOSIS — N5231 Erectile dysfunction following radical prostatectomy: Secondary | ICD-10-CM | POA: Diagnosis not present

## 2023-07-16 DIAGNOSIS — C61 Malignant neoplasm of prostate: Secondary | ICD-10-CM | POA: Diagnosis not present

## 2023-07-17 DIAGNOSIS — Z79899 Other long term (current) drug therapy: Secondary | ICD-10-CM | POA: Diagnosis not present

## 2023-07-17 DIAGNOSIS — C8400 Mycosis fungoides, unspecified site: Secondary | ICD-10-CM | POA: Diagnosis not present

## 2023-07-18 ENCOUNTER — Other Ambulatory Visit: Payer: Self-pay | Admitting: Dermatology

## 2023-07-18 DIAGNOSIS — C8400 Mycosis fungoides, unspecified site: Secondary | ICD-10-CM

## 2023-07-22 ENCOUNTER — Ambulatory Visit: Payer: BC Managed Care – PPO

## 2023-07-22 DIAGNOSIS — K449 Diaphragmatic hernia without obstruction or gangrene: Secondary | ICD-10-CM | POA: Diagnosis not present

## 2023-07-22 DIAGNOSIS — C8400 Mycosis fungoides, unspecified site: Secondary | ICD-10-CM | POA: Diagnosis not present

## 2023-07-22 DIAGNOSIS — Z8572 Personal history of non-Hodgkin lymphomas: Secondary | ICD-10-CM | POA: Diagnosis not present

## 2023-07-22 DIAGNOSIS — K432 Incisional hernia without obstruction or gangrene: Secondary | ICD-10-CM | POA: Diagnosis not present

## 2023-07-22 DIAGNOSIS — Z9049 Acquired absence of other specified parts of digestive tract: Secondary | ICD-10-CM | POA: Diagnosis not present

## 2023-07-22 MED ORDER — IOHEXOL 300 MG/ML  SOLN
100.0000 mL | Freq: Once | INTRAMUSCULAR | Status: AC | PRN
  Administered 2023-07-22: 100 mL via INTRAVENOUS

## 2023-07-22 MED ORDER — IOHEXOL 9 MG/ML PO SOLN: 500.0000 mL | ORAL | Status: AC

## 2023-07-23 DIAGNOSIS — H903 Sensorineural hearing loss, bilateral: Secondary | ICD-10-CM | POA: Diagnosis not present

## 2023-07-28 NOTE — Progress Notes (Signed)
79 yoM former smoker followed for OSA/ [facility] Medical problem list includes recurrent DVT R leg/ Lupus anticoagulant/Xarelto, HTN, OSA, Prostate Cancer, Kidney Stone, Mycosis Fungoides (cutaneous T-cell lymphoma) HST 11/08/22-AHI 11.5/hr, desat to 86%, body weight 210 lbs Dr Jenne Pane- [facility] Implanted 05/15/23-  =================================================   06/24/23- 67 yoM former smoker followed for OSA/ [facility]. Had sleep study 4 yrs ago- unknown provider. Medical problem list includes recurrent DVT R leg/ Lupus anticoagulant/Xarelto, HTN, OSA, Prostate Cancer, Kidney Stone, Mycosis Fungoides (cutaneous T-cell lymphoma), Limited Color Vision, HST 11/08/22-AHI 11.5/hr, desat to 86%, body weight 210 lbs Body weight today-199 lbs Dr Jenne Pane- [facility] Implanted 05/15/23-  Had recurrent DVT when Xarelto held for [facility] implantation. He is to wear compression stockings for travel, to see vascular surgery about venous disease R calf, and to contact Heme-Onc for Lovenox bridging during future surgeries.   Inspire incisions well-healed, no pain. No longer wearing CPAP. [facility] tech is here. Starting at level 1 this week and advance to level 2 next week.  07/30/23- 67 yoM forme smoker followed for OSA/ [facility], complicated by recurrent DVT R leg/ Lupus anticoagulant/Xarelto, HTN, OSA, Prostate Cancer, Kidney Stone, Mycosis Fungoides (cutaneous T-cell lymphoma), Limited Color Vision, Dr Jenne Pane- [facility] Implanted 05/15/23-  Inspire activation begun 06/24/23 Body weight today 201 lbs Now at level 6. Still snoring. Inspire tech [place] here. Plan to reduce start delay to 15 minutes. Stay at level 6 this week, but then work towards level 8. 1 more visit in 4-6 weeks, before sleep study to be scheduled at around 12 weeks. Color bind- hard for him to read the on-off indicator lights. Had flu vax.  ROS-see HPI   + = positive Constitutional:    weight loss, night sweats, fevers, chills, fatigue, lassitude. HEENT:     headaches, difficulty swallowing, tooth/dental problems, sore throat,       sneezing, itching, ear ache, nasal congestion, post nasal drip, snoring CV:    chest pain, orthopnea, PND, swelling in lower extremities, anasarca,                                  dizziness, palpitations Resp:   shortness of breath with exertion or at rest.                productive cough,   non-productive cough, coughing up of blood.              change in color of mucus.  wheezing.   Skin:    rash or lesions. GI:  No-   heartburn, indigestion, abdominal pain, nausea, vomiting, diarrhea,                 change in bowel habits, loss of appetite GU: dysuria, change in color of urine, no urgency or frequency.   flank pain. MS:   joint pain, stiffness, decreased range of motion, back pain. Neuro-     nothing unusual Psych:  change in mood or affect.  depression or anxiety.   memory loss.  OBJ- Physical Exam General- Alert, Oriented, Affect-appropriate, Distress- none acute Skin- rash-none, lesions- none, excoriation- none Lymphadenopathy- none Head- atraumatic            Eyes- Gross vision intact, PERRLA, conjunctivae and secretions clear            Ears- Hearing, canals-normal            Nose- Clear, no-Septal dev, mucus, polyps, erosion, perforation  Throat- Mallampati III , mucosa clear , drainage- none, tonsils- atrophic, +teeth Neck- flexible , trachea midline, no stridor , thyroid nl, carotid no bruit Chest - symmetrical excursion , unlabored           Heart/CV- RRR , no murmur , no gallop  , no rub, nl s1 s2                           - JVD- none , edema- none, stasis changes- none, varices+            Lung- clear to P&A, wheeze- none, cough- none , dullness-none, rub- none           Chest wall-  Abd-  Br/ Gen/ Rectal- Not done, not indicated Extrem- + varix calf Neuro- grossly intact to observation

## 2023-07-30 ENCOUNTER — Ambulatory Visit (INDEPENDENT_AMBULATORY_CARE_PROVIDER_SITE_OTHER): Payer: BC Managed Care – PPO | Admitting: Internal Medicine

## 2023-07-30 ENCOUNTER — Encounter: Payer: Self-pay | Admitting: Internal Medicine

## 2023-07-30 VITALS — BP 122/84 | HR 90 | Ht 72.0 in | Wt 201.0 lb

## 2023-07-30 DIAGNOSIS — G4733 Obstructive sleep apnea (adult) (pediatric): Secondary | ICD-10-CM

## 2023-07-30 DIAGNOSIS — I82401 Acute embolism and thrombosis of unspecified deep veins of right lower extremity: Secondary | ICD-10-CM

## 2023-07-30 NOTE — Patient Instructions (Signed)
Advance as tolerated as discussed today Start delay was changed to 15 minutes

## 2023-08-05 ENCOUNTER — Encounter: Payer: Self-pay | Admitting: Surgery

## 2023-08-05 ENCOUNTER — Ambulatory Visit (INDEPENDENT_AMBULATORY_CARE_PROVIDER_SITE_OTHER): Payer: BC Managed Care – PPO | Admitting: Surgery

## 2023-08-05 VITALS — BP 117/83 | HR 81 | Temp 98.9°F | Resp 20 | Ht 72.0 in | Wt 199.0 lb

## 2023-08-05 DIAGNOSIS — I82811 Embolism and thrombosis of superficial veins of right lower extremities: Secondary | ICD-10-CM

## 2023-08-05 DIAGNOSIS — I82401 Acute embolism and thrombosis of unspecified deep veins of right lower extremity: Secondary | ICD-10-CM | POA: Diagnosis not present

## 2023-08-05 DIAGNOSIS — I8001 Phlebitis and thrombophlebitis of superficial vessels of right lower extremity: Secondary | ICD-10-CM | POA: Diagnosis not present

## 2023-08-05 MED ORDER — PEGASYS 180 MCG/ML SUBCUTANEOUS SOLUTION
SUBCUTANEOUS | 4 refills | 35 days
Start: 2023-08-05 — End: ?

## 2023-08-05 NOTE — Progress Notes (Signed)
Vascular and Vein Specialist of   Patient name: Kevin [name] MRN: [id number] DOB: 1956-06-18 Sex: male   REQUESTING PROVIDER:    Collene Gobble   REASON FOR CONSULT:    SVT  HISTORY OF PRESENT ILLNESS:   Kevin [name] is a 67 y.o. male, who is referred for vascular evaluation based off recent duplex ultrasound that showed acute superficial thrombophlebitis as well as a nonvascularized structure in the lower calf.  He has developed a more prominent bulge in the lower calf.  Initially this was very hard but has softened up.  He will occasionally get swelling.  The patient is currently being followed by hematology.  He has a history of DVT in 2008 in 2015, both occurring after a long distance travel.  He also has a history of 2 brothers with DVT history.  His hypercoagulable workup was positive for lupus anticoagulant.  He does frequent travel and wears compression stocks flights.  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   ctcl cutaneous t cell lymphoma   Prostate cancer (HCC) 2017   Rosacea      FAMILY HISTORY   Family History  Problem Relation Age of Onset   Stroke Father    Hypertension Father    Head & neck cancer Father    Hypertension Mother    Skin cancer Mother    Prostate cancer Paternal Uncle        prostate ca   Colon cancer Maternal Grandmother    Breast cancer Neg Hx     SOCIAL HISTORY:   Social History   Socioeconomic History   Marital status: Married    Spouse name: Not on file   Number of children: Not on file   Years of education: Not on file   Highest education level: Not on file  Occupational History   Not on file  Tobacco Use   Smoking status: Former    Current  packs/day: 0.00    Average packs/day: 0.5 packs/day for 10.0 years (5.0 ttl pk-yrs)    Types: Cigarettes    Start date: 10/08/2004    Quit date: 10/08/2014    Years since quitting: 8.8   Smokeless tobacco: Never  Vaping Use   Vaping status: Never Used  Substance and Sexual Activity   Alcohol use: Not Currently    Comment: 3-4  per day, liquor   Drug use: Yes    Types: Marijuana    Comment: last marijuana use  edible cbd 05/13/2023   Sexual activity: Yes  Other Topics Concern   Not on file  Social History Narrative   Patient is a medical supplies salesman. He resides with his wife in [place]. They have two children; one resides in [place] the other in [place].   05-29-18   Unable to ask abuse questions wife with him today.   Social Determinants of Health   Financial Resource Strain: Not on file  Food Insecurity: Not on file  Transportation Needs: Not on file  Physical Activity: Not on file  Stress: Not on file  Social Connections: Not on file  Intimate Partner Violence: Not on file    ALLERGIES:    Allergies  Allergen Reactions  Zestril [Lisinopril] Other (See Comments) and Cough     ED    CURRENT MEDICATIONS:    Current Outpatient Medications  Medication Sig Dispense Refill   acetaminophen (TYLENOL) 500 MG tablet Take 500-1,000 mg by mouth every 6 (six) hours as needed for moderate pain.     acitretin (SORIATANE) 25 MG capsule Take 25 mg by mouth every evening.     amLODipine-valsartan (EXFORGE) 5-160 MG tablet Take 1 tablet by mouth in the morning.     citalopram (CELEXA) 40 MG tablet Take 40 mg by mouth at bedtime.     Cyanocobalamin 5000 MCG CAPS Take 5,000 mcg by mouth every evening.     MILK THISTLE PO Take 2,000 mg by mouth in the morning and at bedtime.     PEGASYS 180 MCG/ML injection Inject 180 mcg into the skin every Friday.     Psyllium (METAMUCIL PO) Take 1 Dose by mouth daily.     Red Yeast Rice 600 MG CAPS Take 600 mg by mouth 2 (two) times daily.      rivaroxaban (XARELTO) 20 MG TABS tablet Take 20 mg by mouth every evening.     triamcinolone cream (KENALOG) 0.1 % Apply 1 Application topically every other day.     No current facility-administered medications for this visit.    REVIEW OF SYSTEMS:   [X]  denotes positive finding, [ ]  denotes negative finding Cardiac  Comments:  Chest pain or chest pressure:    Shortness of breath upon exertion:    Short of breath when lying flat:    Irregular heart rhythm:        Vascular    Pain in calf, thigh, or hip brought on by ambulation:    Pain in feet at night that wakes you up from your sleep:     Blood clot in your veins:    Leg swelling:         Pulmonary    Oxygen at home:    Productive cough:     Wheezing:         Neurologic    Sudden weakness in arms or legs:     Sudden numbness in arms or legs:     Sudden onset of difficulty speaking or slurred speech:    Temporary loss of vision in one eye:     Problems with dizziness:         Gastrointestinal    Blood in stool:      Vomited blood:         Genitourinary    Burning when urinating:     Blood in urine:        Psychiatric    Major depression:         Hematologic    Bleeding problems:    Problems with blood clotting too easily:        Skin    Rashes or ulcers:        Constitutional    Fever or chills:     PHYSICAL EXAM:   Vitals:   08/05/23 0902  BP: 117/83  Pulse: 81  Resp: 20  Temp: 98.9 F (37.2 C)  SpO2: 96%  Weight: 199 lb (90.3 kg)  Height: 6' (1.829 m)    GENERAL: The patient is a well-nourished male, in no acute distress. The vital signs are documented above. CARDIAC: There is a regular rate and rhythm.  VASCULAR: I used the SonoSite to evaluate the area of concern.  This appears to post.  Inflow as well as outflow veins.  This is somewhat firm on the touch but nontender is no erythema associated with it. PULMONARY: Nonlabored respirations  MUSCULOSKELETAL: There are no major deformities or  cyanosis. NEUROLOGIC: No focal weakness or paresthesias are detected. SKIN: There are no ulcers or rashes noted. PSYCHIATRIC: The patient has a normal affect.  STUDIES:   I have reviewed the following: RIGHT:  - Findings consistent with acute superficial vein thrombosis involving the  right varicosities or other superficial veins.  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  - Portions of this examination were limited- see technologist comments  above.  - No cystic structure found in the popliteal fossa.  - Non-vascularized area is noted and measured at the right distal medial  calf    LEFT:  - No evidence of common femoral vein obstruction.      ASSESSMENT and PLAN   Thrombosed right calf varicose vein.  This is what is being referred to on the ultrasound as a nonvascularized area in the right medial calf.  There are no acute indications to remove this.  There is no erythema or tenderness.  I suspect this will soften up over time.  If it becomes bothersome to him, it could be resected.  It is unclear whether or not this represents a failure of anticoagulation.  I suspect that it does not.  He is on lifelong anticoagulation because of his hypercoagulable profile.  He knows to contact me should he have any changes.  Otherwise, he will follow-up on an as-needed basis.   Charlena Cross, MD, FACS [facility]in Specialists of [facility] [phone] Pager [phone]

## 2023-08-07 NOTE — Unmapped (Signed)
[facility] Clinical Assessment & Refill Coordination Note    Gregory Garrett, DOB: Oct 14, 1955  Phone: [phone] (home)     All above HIPAA information was verified with patient.     Was a Nurse, learning disability used for this call? No    Specialty Medication(s):   Hematology/Oncology: Pegasys     Current Outpatient Medications   Medication Sig Dispense Refill    acetazolamide (DIAMOX) 250 MG tablet TAKE 1 TABLET BY MOUTH DAILY STARTING 1 DAY BEFORE TRAVEL TO HIGH ALTITUDE AND THEN FOR SEVERAL DAYS AFTER OBTAINING HIGHEST ALTITUDE 20 DAYS      amlodipine-valsartan (EXFORGE) 5-160 mg per tablet TAKE 1/2 TO 1 TABLET BY MOUTH EVERY DAY      citalopram (CELEXA) 40 MG tablet Take 1 tablet (40 mg total) by mouth daily.      empty container Misc Use to dispose of needles and syringes. 1 each 2    famotidine (PEPCID) 20 MG tablet Take 1 tablet (20 mg total) by mouth every evening.      peginterferon alfa-2a (PEGASYS) 180 mcg/mL injection Inject 0.75 mL (135 mcg total) under the skin once a week. Discard unused medication. 4 mL 4    syringe with needle (BD TUBERCULIN SYRINGE) 1 mL 21 gauge x 1 Syrg Use as directed to inject Pegasys. 50 each 1    syringe with needle (SYRINGE 3CC/25GX1) 3 mL 25 gauge x 1 Syrg Use as directed 100 each 0    XARELTO 20 mg tablet TAKE 1 TABLET BY MOUTH EVERY DAY WITH FOOD       No current facility-administered medications for this visit.        Changes to medications: Jarone reports no changes at this time.    No Known Allergies    Changes to allergies: No    SPECIALTY MEDICATION ADHERENCE     Pegasys 180  mcg/ml : 1 doses of medicine on hand       Medication Adherence    Patient reported X missed doses in the last month: 0  Specialty Medication: Pegasys 180 mcg/mL  Patient is on additional specialty medications: No  Informant: patient          Specialty medication(s) dose(s) confirmed: Regimen is correct and unchanged.     Are there any concerns with adherence? No    Adherence counseling provided? Not needed    CLINICAL MANAGEMENT AND INTERVENTION      Clinical Benefit Assessment:    Do you feel the medicine is effective or helping your condition? Yes    Clinical Benefit counseling provided? Not needed    Adverse Effects Assessment:    Are you experiencing any side effects? No    Are you experiencing difficulty administering your medicine? No    Quality of Life Assessment:    Quality of Life    Rheumatology  Oncology  Dermatology  Cystic Fibrosis          How many days over the past month did your condition  keep you from your normal activities? For example, brushing your teeth or getting up in the morning. Patient declined to answer    Have you discussed this with your provider? Not needed    Acute Infection Status:    Acute infections noted within Epic:  No active infections  Patient reported infection: None    Therapy Appropriateness:    Is therapy appropriate based on current medication list, adverse reactions, adherence, clinical benefit and progress toward achieving therapeutic goals?  Yes, therapy is appropriate and should be continued     DISEASE/MEDICATION-SPECIFIC INFORMATION      For patients on injectable medications: Patient currently has 1 doses left.  Next injection is scheduled for 08/09/23.    Oncology: Is the patient receiving adequate infection prevention treatment? Not applicable  Does the patient have adequate nutritional support? Not applicable    PATIENT SPECIFIC NEEDS     Does the patient have any physical, cognitive, or cultural barriers? No    Is the patient high risk? No    Did the patient require a clinical intervention? No    Does the patient require physician intervention or other additional services (i.e., nutrition, smoking cessation, social work)? No    SOCIAL DETERMINANTS OF HEALTH     At the [facility], we have learned that life circumstances - like trouble affording food, housing, utilities, or transportation can affect the health of many of our patients.   That is why we wanted to ask: are you currently experiencing any life circumstances that are negatively impacting your health and/or quality of life? No    Social Determinants of Health     Food Insecurity: Not on file   Internet Connectivity: Not on file   Housing/Utilities: Not on file   Tobacco Use: Not on file   Transportation Needs: Not on file   Alcohol Use: Not on file   Interpersonal Safety: Unknown (08/07/2023)    Interpersonal Safety     Unsafe Where You Currently Live: Not on file     Physically Hurt by Anyone: Not on file     Abused by Anyone: Not on file   Physical Activity: Not on file   Intimate Partner Violence: Not on file   Stress: Not on file   Substance Use: Not on file   Social Connections: Not on file   Financial Resource Strain: Not on file   Depression: Not on file   Health Literacy: Not on file       Would you be willing to receive help with any of the needs that you have identified today? Not applicable       SHIPPING     Specialty Medication(s) to be Shipped:   Hematology/Oncology: Pegasys    Other medication(s) to be shipped: No additional medications requested for fill at this time     Changes to insurance: No    Delivery Scheduled: Yes, Expected medication delivery date: 08/14/23.  However, Rx request for refills was sent to the provider as there are none remaining.     Medication will be delivered via UPS to the confirmed prescription address in [facility].    The patient will receive a drug information handout for each medication shipped and additional FDA Medication Guides as required.  Verified that patient has previously received a Conservation officer, historic buildings and a Surveyor, mining.    The patient or caregiver noted above participated in the development of this care plan and knows that they can request review of or adjustments to the care plan at any time.      All of the patient's questions and concerns have been addressed.    Ayrton Mcvay Vangie Bicker, PharmD   [facility] Specialty Pharmacist

## 2023-08-08 MED ORDER — PEGASYS 180 MCG/ML SUBCUTANEOUS SOLUTION
SUBCUTANEOUS | 4 refills | 35 days
Start: 2023-08-08 — End: ?

## 2023-08-12 MED ORDER — PEGASYS 180 MCG/ML SUBCUTANEOUS SOLUTION
SUBCUTANEOUS | 4 refills | 35 days
Start: 2023-08-12 — End: ?

## 2023-08-13 MED ORDER — PEGASYS 180 MCG/ML SUBCUTANEOUS SOLUTION
SUBCUTANEOUS | 4 refills | 35 days
Start: 2023-08-13 — End: ?

## 2023-08-13 NOTE — Unmapped (Signed)
Gregory Garrett 's PEGASYS shipment will be delayed as a result of no refills remain on the prescription.      I have spoken with the patient  at [phone]  and communicated the delay. We will call the patient back to reschedule the delivery upon resolution. We have not confirmed the new delivery date.

## 2023-08-16 DIAGNOSIS — C61 Malignant neoplasm of prostate: Secondary | ICD-10-CM | POA: Diagnosis not present

## 2023-08-16 MED ORDER — PEGASYS 180 MCG/ML SUBCUTANEOUS SOLUTION
SUBCUTANEOUS | 4 refills | 35 days
Start: 2023-08-16 — End: ?

## 2023-08-20 MED ORDER — PEGASYS 180 MCG/ML SUBCUTANEOUS SOLUTION
SUBCUTANEOUS | 4 refills | 35 days
Start: 2023-08-20 — End: ?

## 2023-08-23 MED ORDER — PEGASYS 180 MCG/ML SUBCUTANEOUS SOLUTION
SUBCUTANEOUS | 4 refills | 35 days
Start: 2023-08-23 — End: ?

## 2023-08-25 ENCOUNTER — Encounter: Payer: Self-pay | Admitting: Internal Medicine

## 2023-08-25 NOTE — Assessment & Plan Note (Signed)
Continues long term anticoagulation

## 2023-08-25 NOTE — Assessment & Plan Note (Signed)
Gradually advance toward level 8 as tolerated. Anticipate titration sleep study after next office visit.

## 2023-08-27 ENCOUNTER — Other Ambulatory Visit: Payer: Self-pay | Admitting: Internal Medicine

## 2023-08-27 ENCOUNTER — Encounter: Payer: Self-pay | Admitting: Internal Medicine

## 2023-08-27 ENCOUNTER — Ambulatory Visit (INDEPENDENT_AMBULATORY_CARE_PROVIDER_SITE_OTHER): Payer: BC Managed Care – PPO | Admitting: Internal Medicine

## 2023-08-27 VITALS — BP 118/75 | HR 86

## 2023-08-27 DIAGNOSIS — G4733 Obstructive sleep apnea (adult) (pediatric): Secondary | ICD-10-CM

## 2023-08-27 DIAGNOSIS — H5350 Unspecified color vision deficiencies: Secondary | ICD-10-CM

## 2023-08-27 NOTE — Unmapped (Signed)
Gregory Garrett 's PEGASYS 180 mcg/mL injection (peginterferon alfa-2a) shipment will be canceled as a result of never receiving a refill from the provider after multiple attempts.     I have spoken with the patient  at [phone]) [phone]  and communicated the delay. We will not reschedule the medication and have removed this/these medication(s) from the work request.  We have canceled this work request.

## 2023-08-27 NOTE — Progress Notes (Unsigned)
76 yoM former smoker followed for OSA/ Inspire Medical problem list includes recurrent DVT R leg/ Lupus anticoagulant/Xarelto, HTN, OSA, Prostate Cancer, Kidney Stone, Mycosis Fungoides (cutaneous T-cell lymphoma) HST 11/08/22-AHI 11.5/hr, desat to 86%, body weight 210 lbs Dr Jenne Pane- Inspire Implanted 05/15/23-  =================================================   07/30/23- 67 yoM forme smoker followed for OSA/ Inspire, complicated by recurrent DVT R leg/ Lupus anticoagulant/Xarelto, HTN, OSA, Prostate Cancer, Kidney Stone, Mycosis Fungoides (cutaneous T-cell lymphoma), Limited Color Vision, Dr Jenne Pane- Inspire Implanted 05/15/23-  Inspire activation begun 06/24/23 Body weight today 201 lbs Now at level 6. Still snoring. Inspire tech [place] here. Plan to reduce start delay to 15 minutes. Stay at level 6 this week, but then work towards level 8. 1 more visit in 4-6 weeks, before sleep study to be scheduled at around 12 weeks. 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 Had flu vax.  08/27/23- 67 yoM forme smoker followed for OSA/ Inspire, complicated by recurrent DVT R leg/ Lupus anticoagulant/Xarelto, HTN, OSA, Prostate Cancer, Kidney Stone, Mycosis Fungoides (cutaneous T-cell lymphoma), Limited Color Vision, Dr Jenne Pane- Inspire Implanted 05/15/23-  Inspire activation begun 06/24/23 Body weight today  Was at level 6, goal to build to level 8 before next sleep study.   ROS-see HPI   + = positive Constitutional:    weight loss, night sweats, fevers, chills, fatigue, lassitude. HEENT:    headaches, difficulty swallowing, tooth/dental problems, sore throat,       sneezing, itching, ear ache, nasal congestion, post nasal drip, snoring CV:    chest pain, orthopnea, PND, swelling in lower extremities, anasarca,                                  dizziness, palpitations Resp:   shortness of breath with exertion or at rest.                productive cough,   non-productive cough, coughing up of  blood.              change in color of mucus.  wheezing.   Skin:    rash or lesions. GI:  No-   heartburn, indigestion, abdominal pain, nausea, vomiting, diarrhea,                 change in bowel habits, loss of appetite GU: dysuria, change in color of urine, no urgency or frequency.   flank pain. MS:   joint pain, stiffness, decreased range of motion, back pain. Neuro-     nothing unusual Psych:  change in mood or affect.  depression or anxiety.   memory loss.  OBJ- Physical Exam General- Alert, Oriented, Affect-appropriate, Distress- none acute Skin- rash-none, lesions- none, excoriation- none Lymphadenopathy- none Head- atraumatic            Eyes- Gross vision intact, PERRLA, conjunctivae and secretions clear            Ears- Hearing, canals-normal            Nose- Clear, no-Septal dev, mucus, polyps, erosion, perforation             Throat- Mallampati III , mucosa clear , drainage- none, tonsils- atrophic, +teeth Neck- flexible , trachea midline, no stridor , thyroid nl, carotid no bruit Chest - symmetrical excursion , unlabored           Heart/CV- RRR , no murmur , no gallop  , no rub, nl  s1 s2                           - JVD- none , edema- none, stasis changes- none, varices+            Lung- clear to P&A, wheeze- none, cough- none , dullness-none, rub- none           Chest wall-  Abd-  Br/ Gen/ Rectal- Not done, not indicated Extrem- + varix calf Neuro- grossly intact to observation

## 2023-08-27 NOTE — Patient Instructions (Signed)
Kevin [name] is scheduling in-lab over night titration sleep study  We will see you back about 2 weeks after the sleep study  Please call if we can help

## 2023-08-28 MED ORDER — PEGASYS 180 MCG/ML SUBCUTANEOUS SOLUTION
SUBCUTANEOUS | 4 refills | 0 days
Start: 2023-08-28 — End: ?

## 2023-09-02 MED FILL — PEGASYS 180 MCG/ML SUBCUTANEOUS SOLUTION: SUBCUTANEOUS | 28 days supply | Qty: 4 | Fill #0

## 2023-09-02 NOTE — Unmapped (Signed)
Gregory Garrett 's Pegasys shipment will be rescheduled as a result of a new prescription for the medication has been received.      I have spoken with the patient  at [phone]  and communicated the delivery change. We will reschedule the medication for the delivery date that the patient agreed upon.  We have confirmed the delivery date as 11/26, via ups.

## 2023-09-25 NOTE — Unmapped (Signed)
[facility] Refill Coordination Note    Specialty Medication(s) to be Shipped:   Hematology/Oncology: PEGASYS 180 mcg/mL injection (peginterferon alfa-2a)    Other medication(s) to be shipped: No additional medications requested for fill at this time     Gregory Garrett, DOB: 1956/09/30  Phone: [phone] (home)       All above HIPAA information was verified with patient.     Was a Nurse, learning disability used for this call? No    Completed refill call assessment today to schedule patient's medication shipment from the [facility]  [phone]).  All relevant notes have been reviewed.     Specialty medication(s) and dose(s) confirmed: Regimen is correct and unchanged.   Changes to medications: Gregory Garrett reports no changes at this time.  Changes to insurance: No  New side effects reported not previously addressed with a pharmacist or physician: None reported  Questions for the pharmacist: No    Confirmed patient received a Conservation officer, historic buildings and a Surveyor, mining with first shipment. The patient will receive a drug information handout for each medication shipped and additional FDA Medication Guides as required.       DISEASE/MEDICATION-SPECIFIC INFORMATION        For patients on injectable medications: Patient currently has 1-2 doses left.  Next injection is scheduled for 09/25/2023.    SPECIALTY MEDICATION ADHERENCE     Medication Adherence    Patient reported X missed doses in the last month: 0  Specialty Medication: PEGASYS 180 mcg/mL injection (peginterferon alfa-2a)  Patient is on additional specialty medications: No  Informant: patient              Were doses missed due to medication being on hold? No      PEGASYS 180 mcg/mL injection (peginterferon alfa-2a): 2 doses of medicine on hand       REFERRAL TO PHARMACIST     Referral to the pharmacist: Not needed      [facility]     Shipping address confirmed in Epic.       Delivery Scheduled: Yes, Expected medication delivery date: 09/27/2023.     Medication will be delivered via UPS to the prescription address in Epic [facility].    Gregory Garrett [facility]  Specialty Technician

## 2023-09-26 MED FILL — PEGASYS 180 MCG/ML SUBCUTANEOUS SOLUTION: SUBCUTANEOUS | 28 days supply | Qty: 4 | Fill #1

## 2023-10-13 ENCOUNTER — Encounter: Payer: Self-pay | Admitting: Internal Medicine

## 2023-10-13 DIAGNOSIS — H5350 Unspecified color vision deficiencies: Secondary | ICD-10-CM | POA: Insufficient documentation

## 2023-10-13 NOTE — Assessment & Plan Note (Signed)
 Ok to stay on Inspire 2.3V now Plan to reassess with in-lab sleep study then return for f/u

## 2023-10-13 NOTE — Assessment & Plan Note (Signed)
 Interferes some with easy use of his Inspire hand set, but he is learning.

## 2023-10-14 ENCOUNTER — Ambulatory Visit (HOSPITAL_BASED_OUTPATIENT_CLINIC_OR_DEPARTMENT_OTHER): Payer: BC Managed Care – PPO | Attending: Internal Medicine | Admitting: Internal Medicine

## 2023-10-14 DIAGNOSIS — G4733 Obstructive sleep apnea (adult) (pediatric): Secondary | ICD-10-CM | POA: Insufficient documentation

## 2023-10-19 NOTE — Procedures (Signed)
    Patient Name: Kevin [name], [place]min[place] Date: 10/14/2023 Gender: Male D.O.B: Dec 15, 1955 Age (years): 22 Referring Provider: Reggy Salt MD, ABSM Height (inches): 72 Interpreting Physician: Reggy Salt MD, ABSM Weight (lbs): 205 RPSGT: Cordella Phenes BMI: 28 MRN: [id number] Neck Size: 16.50  CLINICAL INFORMATION The patient is referred for an Inspire titration to treat sleep apnea.  Date of NPSG, Split Night or HST: HST 11/08/22 AHI 11.5/hr, desaturation to 86%, body weight 210 lbs  SLEEP STUDY TECHNIQUE As per the AASM Manual for the Scoring of Sleep and Associated Events v2.3 (April 2016) with a hypopnea requiring 4% desaturations.  The channels recorded and monitored were frontal, central and occipital EEG, electrooculogram (EOG), submentalis EMG (chin), nasal and oral airflow, thoracic and abdominal wall motion, anterior tibialis EMG, snore microphone, electrocardiogram, and pulse oximetry. Continuous positive airway pressure (CPAP) was initiated at the beginning of the study and titrated to treat sleep-disordered breathing.  MEDICATIONS Medications self-administered by patient taken the night of the study : N/A  TECHNICIAN COMMENTS Comments added by technician: None Comments added by scorer: N/A  RESPIRATORY PARAMETERS Optimal PAP Pressure (cm): 1.6 AHI at Optimal Pressure (/hr): 4.6 Overall Minimal O2 (%): 87.0 Supine % at Optimal Pressure (%): 0 Minimal O2 at Optimal Pressure (%): 91.0   SLEEP ARCHITECTURE The study was initiated at 9:56:42 PM and ended at 5:07:26 AM.  Sleep onset time was 55.1 minutes and the sleep efficiency was 54.9%. The total sleep time was 236.5 minutes.  The patient spent 8.5% of the night in stage N1 sleep, 82.7% in stage N2 sleep, 0.0% in stage N3 and 8.9% in REM.Stage REM latency was 344.0 minutes  Wake after sleep onset was 139.1. Alpha intrusion was absent. Supine sleep was 0.00%.  CARDIAC DATA The 2 lead EKG demonstrated sinus rhythm.  The mean heart rate was 65.2 beats per minute. Other EKG findings include: None.  LEG MOVEMENT DATA The total Periodic Limb Movements of Sleep (PLMS) were 0. The PLMS index was 0.0. A PLMS index of <15 is considered normal in adults.  IMPRESSIONS - Sleep onset delayed until almost 01:00AM. - Arrival AMP 2.3V. Titration started at 2.1V and increased to maximum 2.5V, then readjusted to optimum 1.6V (AHI 4.6/hr). - Mild oxygen  desaturations were observed during this titration (min O2 = 87.0%). On 1.6V, minimum O2 saturation 91%. - The patient snored with moderate snoring volume during this titration study. - No cardiac abnormalities were observed during this study. - Clinically significant periodic limb movements were not noted during this study. Arousals associated with PLMs were rare.  DIAGNOSIS - Obstructive Sleep Apnea (G47.33)  RECOMMENDATIONS - Trial of Inspire 1.6V. - Be careful with alcohol, sedatives and other CNS depressants that may worsen sleep apnea and disrupt normal sleep architecture. - Sleep hygiene should be reviewed to assess factors that may improve sleep quality. - Weight management and regular exercise should be initiated or continued.  [Electronically signed] 10/19/2023 12:50 PM  Reggy Salt MD, ABSM Diplomate, American Board of Sleep Medicine NPI: [id number]                         Reggy Salt Diplomate, American Board of Sleep Medicine  ELECTRONICALLY SIGNED ON:  10/19/2023, 12:39 PM [facility] PH: [phone]   FX: [phone] ACCREDITED BY THE AMERICAN ACADEMY OF SLEEP MEDICINE

## 2023-10-30 NOTE — Unmapped (Signed)
Gregory Garrett has been contacted in regards to their refill of Pegasys. At this time, they have declined refill due to patient having 3 doses remaining. Refill assessment call date has been updated per the patient's request.

## 2023-11-01 NOTE — Progress Notes (Unsigned)
59 yoM former smoker followed for OSA/ Inspire Medical problem list includes recurrent DVT R leg/ Lupus anticoagulant/Xarelto, HTN, OSA, Prostate Cancer, Kidney Stone, Mycosis Fungoides (cutaneous T-cell lymphoma) HST 11/08/22-AHI 11.5/hr, desat to 86%, body weight 210 lbs Dr Jenne Pane- Inspire Implanted 05/15/23-  NPSG Inspire Titration study 10/15/23- optimum at 1.6V (AHI 4.6/hr), body weight 205 lbs =================================================   08/27/23- 67 yoM forme smoker followed for OSA/ Inspire, complicated by recurrent DVT R leg/ Lupus anticoagulant/Xarelto, HTN, OSA, Prostate Cancer, Kidney Stone, Mycosis Fungoides (cutaneous T-cell lymphoma), Limited Color Vision, Dr Jenne Pane- Inspire Implanted 05/15/23-  Inspire activation begun 06/24/23 Body weight today  Was at level 6, goal to build to level 8 before next sleep study. Comfortable at 2.3V (lvel 10). Reports sleeping 8-9 hours/ night Has some trouble with colored indicator lights on handset due to his color blindness.  We are scheduling in-lab sleep study on Level 10 now.  Changing start delay to 15 minutes and pause to 20 minutes.  11/04/23- 67 yoM forme smoker followed for OSA/ Inspire, complicated by recurrent DVT R leg/ Lupus anticoagulant/Xarelto, HTN, OSA, Prostate Cancer, Kidney Stone, Mycosis Fungoides (cutaneous T-cell lymphoma), Limited Color Vision, Dr Jenne Pane- Inspire Implanted 05/15/23-  Inspire activation begun 06/24/23 Body weight today  NPSG Inspire Titration study 10/15/23- optimum at 1.6V (AHI 4.6/hr), body weight 205 lbs CT body 08/01/23- negative for adenopathy Discussed the use of AI scribe software for clinical note transcription with the patient, who gave verbal consent to proceed.  History of Present Illness   The patient, with a history of sleep apnea on Inspire, reports improved sleep quality and vivid dreams since adjusting to a new treatment level at 0.6V. He reports no issues with waking up at night and feels rested  during the day. Despite the new treatment level, the patient continues to snore, but this is not a concern as he sleeps alone. The patient also mentions a desire to change the pause length on his device, as he feels it is unnecessary at the current level. He has successfully reached end of Inspire adjustment after recent titration study. He denies other illness changes or concerns.     ROS-see HPI   + = positive Constitutional:    weight loss, night sweats, fevers, chills, fatigue, lassitude. HEENT:    headaches, difficulty swallowing, tooth/dental problems, sore throat,       sneezing, itching, ear ache, nasal congestion, post nasal drip, snoring CV:    chest pain, orthopnea, PND, swelling in lower extremities, anasarca,                                   dizziness, palpitations Resp:   shortness of breath with exertion or at rest.                productive cough,   non-productive cough, coughing up of blood.              change in color of mucus.  wheezing.   Skin:    rash or lesions. GI:  No-   heartburn, indigestion, abdominal pain, nausea, vomiting, diarrhea,                 change in bowel habits, loss of appetite GU: dysuria, change in color of urine, no urgency or frequency.   flank pain. MS:   joint pain, stiffness, decreased range of motion, back pain. Neuro-     nothing unusual Psych:  change in mood or affect.  depression or anxiety.   memory loss.  OBJ- Physical Exam General- Alert, Oriented, Affect-appropriate, Distress- none acute Skin- rash-none, lesions- none, excoriation- none Lymphadenopathy- none Head- atraumatic            Eyes- Gross vision intact, PERRLA, conjunctivae and secretions clear            Ears- Hearing, canals-normal            Nose- Clear, no-Septal dev, mucus, polyps, erosion, perforation             Throat- Mallampati III , mucosa clear , drainage- none, tonsils- atrophic, +teeth Neck- flexible , trachea midline, no stridor , thyroid nl, carotid no  bruit Chest - symmetrical excursion , unlabored           Heart/CV- RRR , no murmur , no gallop  , no rub, nl s1 s2                           - JVD- none , edema- none, stasis changes- none, varices+            Lung- clear to P&A, wheeze- none, cough- none , dullness-none, rub- none           Chest wall-  Abd-  Br/ Gen/ Rectal- Not done, not indicated Extrem- + varix calf Neuro- grossly intact to observation  Assessment and Plan    Obstructive Sleep Apnea Patient reports improved sleep quality at level 0.6V on his Inspire device. No issues with daytime sleepiness. Reports persistent snoring, but no impact on others due to separate sleeping arrangements. Discussed potential use of sleep tape to reduce snoring, but no immediate need identified. -Continue current Inspire settings. -Adjust Inspire settings to a 15-minute pause and 15-minute start delay. -Condense Inspire remote levels to five, with the current therapeutic level as the third level. -Plan for a six-month follow-up appointment. -Annual home sleep test to ensure continued effective treatment of apnea.

## 2023-11-04 ENCOUNTER — Ambulatory Visit (INDEPENDENT_AMBULATORY_CARE_PROVIDER_SITE_OTHER): Payer: Medicare Other | Admitting: Internal Medicine

## 2023-11-04 ENCOUNTER — Encounter: Payer: Self-pay | Admitting: Internal Medicine

## 2023-11-04 VITALS — BP 128/84 | HR 78 | Temp 98.3°F | Resp 18 | Ht 72.0 in | Wt 204.8 lb

## 2023-11-04 DIAGNOSIS — G4733 Obstructive sleep apnea (adult) (pediatric): Secondary | ICD-10-CM | POA: Diagnosis not present

## 2023-11-04 NOTE — Patient Instructions (Signed)
You can hold Inspire at 0.6V, but you can also go up or down if you choose.  Pause time changed to 15 minutes.

## 2023-11-20 NOTE — Unmapped (Signed)
[facility] Refill Coordination Note    Specialty Medication(s) to be Shipped:   Hematology/Oncology: Pegasys 180 mcg/mL    Other medication(s) to be shipped: No additional medications requested for fill at this time     Gregory Garrett, DOB: 15-Sep-1956  Phone: [phone] (home)       All above HIPAA information was verified with patient.     Was a Nurse, learning disability used for this call? No    Completed refill call assessment today to schedule patient's medication shipment from the [facility] [phone]).  All relevant notes have been reviewed.     Specialty medication(s) and dose(s) confirmed: Regimen is correct and unchanged.   Changes to medications: Jahmarion reports no changes at this time.  Changes to insurance: No  New side effects reported not previously addressed with a pharmacist or physician: None reported  Questions for the pharmacist: No    Confirmed patient received a Conservation officer, historic buildings and a Surveyor, mining with first shipment. The patient will receive a drug information handout for each medication shipped and additional FDA Medication Guides as required.       DISEASE/MEDICATION-SPECIFIC INFORMATION        For patients on injectable medications: Patient currently has 2 doses left.  Next injection is scheduled for 11/27/23.    SPECIALTY MEDICATION ADHERENCE     Medication Adherence    Patient reported X missed doses in the last month: 0  Specialty Medication: Pegasys 180 mcg/mL  Patient is on additional specialty medications: No  Informant: patient     Were doses missed due to medication being on hold? No    Pegasys 180 mcg/mL: 2 days of medicine on hand       REFERRAL TO PHARMACIST     Referral to the pharmacist: Not needed      [facility]     Shipping address confirmed in Epic.     Delivery Scheduled: Yes, Expected medication delivery date: 12/02/22.     Medication will be delivered via UPS to the prescription address in Epic [place].    [name] M Elisabeth Cara   [facility] Specialty Technician

## 2023-12-02 DIAGNOSIS — C84A0 Cutaneous T-cell lymphoma, unspecified, unspecified site: Principal | ICD-10-CM

## 2023-12-02 NOTE — Unmapped (Signed)
 Gregory Garrett 's PEGASYS 180 mcg/mL injection (peginterferon alfa-2a) shipment will be delayed as a result of a high copay.     I have reached out to the patient  at [phone]  and communicated the delay. We will call the patient back to reschedule the delivery upon resolution. We have not confirmed the new delivery date.

## 2023-12-24 NOTE — Unmapped (Signed)
 Gregory Garrett 's Pegasys shipment will be canceled as a result of a high copay.     I have spoken with the patient   and communicated the delivery change. We will not reschedule the medication and have removed this/these medication(s) from the work request.  We have canceled this work request.

## 2024-01-21 MED ORDER — PEGASYS 180 MCG/ML SUBCUTANEOUS SOLUTION
SUBCUTANEOUS | 4 refills | 0.00 days
Start: 2024-01-21 — End: ?

## 2024-01-22 DIAGNOSIS — C84A0 Cutaneous T-cell lymphoma, unspecified, unspecified site: Principal | ICD-10-CM

## 2024-02-04 MED ORDER — LEVOTHYROXINE 50 MCG TABLET
ORAL_TABLET | Freq: Every day | ORAL | 3 refills | 90.00000 days
Start: 2024-02-04 — End: ?

## 2024-02-04 MED ORDER — ATORVASTATIN 20 MG TABLET
ORAL_TABLET | Freq: Every day | ORAL | 3 refills | 90.00000 days
Start: 2024-02-04 — End: ?

## 2024-02-13 MED FILL — ATORVASTATIN 20 MG TABLET: ORAL | 90 days supply | Qty: 90 | Fill #0

## 2024-02-13 MED FILL — LEVOTHYROXINE 50 MCG TABLET: ORAL | 90 days supply | Qty: 90 | Fill #0

## 2024-02-13 NOTE — Unmapped (Signed)
 [facility] Specialty Medication Onboarding    Specialty Medication: PEGASYS  180 mcg/mL injection (peginterferon alfa-2a )  Prior Authorization: Approved   Financial Assistance: No - copay  <$25  Final Copay/Day Supply: $0.00 / 28 days     Insurance Restrictions: Yes - max 1 month supply     Notes to Pharmacist: none  Credit Card on File: not applicable  Start Date on Rx:  01/21/24    The triage team has completed the benefits investigation and has determined that the patient is able to fill this medication at [facility]. Please contact the patient to complete the onboarding or follow up with the prescribing physician as needed.

## 2024-02-17 MED ORDER — BEXAROTENE 75 MG CAPSULE
ORAL_CAPSULE | Freq: Every day | ORAL | 4 refills | 30.00000 days
Start: 2024-02-17 — End: ?

## 2024-02-17 NOTE — Unmapped (Signed)
 Specialty Medication(s): Pegasys     Gregory Garrett has been dis-enrolled from the [facility] specialty pharmacy services as a result of no longer taking medication (no further information was provided).    Additional information provided to the patient:      Mirella Gueye Dennard Fisher, PharmD  [facility] Specialty Pharmacist

## 2024-04-20 MED FILL — LEVOTHYROXINE 50 MCG TABLET: ORAL | 90 days supply | Qty: 90 | Fill #1

## 2024-04-28 ENCOUNTER — Encounter (HOSPITAL_BASED_OUTPATIENT_CLINIC_OR_DEPARTMENT_OTHER): Payer: Self-pay

## 2024-04-29 ENCOUNTER — Encounter (HOSPITAL_BASED_OUTPATIENT_CLINIC_OR_DEPARTMENT_OTHER): Attending: Internal Medicine | Admitting: Internal Medicine

## 2024-04-29 DIAGNOSIS — C61 Malignant neoplasm of prostate: Secondary | ICD-10-CM | POA: Insufficient documentation

## 2024-04-29 DIAGNOSIS — Z923 Personal history of irradiation: Secondary | ICD-10-CM | POA: Diagnosis not present

## 2024-04-29 DIAGNOSIS — N3041 Irradiation cystitis with hematuria: Secondary | ICD-10-CM | POA: Diagnosis present

## 2024-05-01 ENCOUNTER — Encounter (HOSPITAL_BASED_OUTPATIENT_CLINIC_OR_DEPARTMENT_OTHER): Payer: Self-pay | Admitting: Internal Medicine

## 2024-05-01 MED FILL — ATORVASTATIN 20 MG TABLET: ORAL | 90 days supply | Qty: 90 | Fill #1

## 2024-05-01 NOTE — Progress Notes (Deleted)
 78 yoM former smoker followed for OSA/ Inspire Medical problem list includes recurrent DVT R leg/ Lupus anticoagulant/Xarelto, HTN, OSA, Prostate Cancer, Kidney Stone, Mycosis Fungoides (cutaneous T-cell lymphoma) HST 11/08/22-AHI 11.5/hr, desat to 86%, body weight 210 lbs Dr Carlie- Inspire Implanted 05/15/23-  NPSG Inspire Titration study 10/15/23- optimum at 1.6V (AHI 4.6/hr), body weight 205 lbs =================================================  11/04/23- 67 yoM forme smoker followed for OSA/ Inspire, complicated by recurrent DVT R leg/ Lupus anticoagulant/Xarelto, HTN, OSA, Prostate Cancer, Kidney Stone, Mycosis Fungoides (cutaneous T-cell lymphoma), Limited Color Vision, Dr Carlie- Inspire Implanted 05/15/23-  Inspire activation begun 06/24/23 Body weight today  NPSG Inspire Titration study 10/15/23- optimum at 1.6V (AHI 4.6/hr), body weight 205 lbs CT body 08/01/23- negative for adenopathy Discussed the use of AI scribe software for clinical note transcription with the patient, who gave verbal consent to proceed.  History of Present Illness   The patient, with a history of sleep apnea on Inspire, reports improved sleep quality and vivid dreams since adjusting to a new treatment level at 0.6V. He reports no issues with waking up at night and feels rested during the day. Despite the new treatment level, the patient continues to snore, but this is not a concern as he sleeps alone. The patient also mentions a desire to change the pause length on his device, as he feels it is unnecessary at the current level. He has successfully reached end of Inspire adjustment after recent titration study. He denies other illness changes or concerns.   05/04/24- 67 yoM forme smoker followed for OSA/ Inspire, complicated by recurrent DVT R leg/ Lupus anticoagulant/Xarelto, HTN, OSA, Prostate Cancer, Kidney Stone, Mycosis Fungoides (cutaneous T-cell lymphoma), Limited Color Vision, Dr Carlie- Inspire Implanted 05/15/23-   Inspire activation begun 06/24/23 NPSG Inspire Titration study 10/15/23- optimum at 1.6V (AHI 4.6/hr), body weight 205 lbs Body weight today       ROS-see HPI   + = positive Constitutional:    weight loss, night sweats, fevers, chills, fatigue, lassitude. HEENT:    headaches, difficulty swallowing, tooth/dental problems, sore throat,       sneezing, itching, ear ache, nasal congestion, post nasal drip, snoring CV:    chest pain, orthopnea, PND, swelling in lower extremities, anasarca,                                   dizziness, palpitations Resp:   shortness of breath with exertion or at rest.                productive cough,   non-productive cough, coughing up of blood.              change in color of mucus.  wheezing.   Skin:    rash or lesions. GI:  No-   heartburn, indigestion, abdominal pain, nausea, vomiting, diarrhea,                 change in bowel habits, loss of appetite GU: dysuria, change in color of urine, no urgency or frequency.   flank pain. MS:   joint pain, stiffness, decreased range of motion, back pain. Neuro-     nothing unusual Psych:  change in mood or affect.  depression or anxiety.   memory loss.  OBJ- Physical Exam General- Alert, Oriented, Affect-appropriate, Distress- none acute Skin- rash-none, lesions- none, excoriation- none Lymphadenopathy- none Head- atraumatic  Eyes- Gross vision intact, PERRLA, conjunctivae and secretions clear            Ears- Hearing, canals-normal            Nose- Clear, no-Septal dev, mucus, polyps, erosion, perforation             Throat- Mallampati III , mucosa clear , drainage- none, tonsils- atrophic, +teeth Neck- flexible , trachea midline, no stridor , thyroid  nl, carotid no bruit Chest - symmetrical excursion , unlabored           Heart/CV- RRR , no murmur , no gallop  , no rub, nl s1 s2                           - JVD- none , edema- none, stasis changes- none, varices+            Lung- clear to P&A, wheeze-  none, cough- none , dullness-none, rub- none           Chest wall-  Abd-  Br/ Gen/ Rectal- Not done, not indicated Extrem- + varix calf Neuro- grossly intact to observation

## 2024-05-04 ENCOUNTER — Ambulatory Visit: Payer: Medicare Other | Admitting: Internal Medicine

## 2024-05-08 ENCOUNTER — Telehealth: Payer: Self-pay

## 2024-05-08 NOTE — Telephone Encounter (Signed)
-----   Message from Carren DELENA Lewis sent at 05/07/2024  9:21 AM EDT ----- Regarding: reschedule appointment Good Morning Dr. Neysa,   I'm reaching out to see if I can reschedule Kevin [name]'s  INSPIRE appointment that was canceled for Monday, 7/28. Would it be possible to use one of your provider request times? If not, would it be okay to schedule the patient with another provider? Please let me know what you prefer. Thank you.

## 2024-05-08 NOTE — Telephone Encounter (Signed)
Dr. Young, please advise. Thank you!

## 2024-05-08 NOTE — Telephone Encounter (Signed)
 My schedule is changing and going to be crowded. See if you can get him back with another sleep doc or App please.

## 2024-05-10 NOTE — Telephone Encounter (Signed)
 Ok with me if Kevin [name] continues with the other sleep provider going forward.

## 2024-05-11 NOTE — Telephone Encounter (Signed)
 Good Morning Dr. Neysa,   Thank you for the update on Mr. Vereen care moving forward. I will inform Mr. Buch as well.

## 2024-05-21 ENCOUNTER — Encounter (HOSPITAL_BASED_OUTPATIENT_CLINIC_OR_DEPARTMENT_OTHER): Payer: Self-pay

## 2024-05-26 ENCOUNTER — Encounter (HOSPITAL_BASED_OUTPATIENT_CLINIC_OR_DEPARTMENT_OTHER): Attending: Internal Medicine | Admitting: Internal Medicine

## 2024-05-26 DIAGNOSIS — Z923 Personal history of irradiation: Secondary | ICD-10-CM | POA: Diagnosis not present

## 2024-05-26 DIAGNOSIS — C61 Malignant neoplasm of prostate: Secondary | ICD-10-CM | POA: Insufficient documentation

## 2024-05-26 DIAGNOSIS — N3041 Irradiation cystitis with hematuria: Secondary | ICD-10-CM | POA: Diagnosis present

## 2024-05-27 ENCOUNTER — Encounter (HOSPITAL_BASED_OUTPATIENT_CLINIC_OR_DEPARTMENT_OTHER): Admitting: Internal Medicine

## 2024-05-27 DIAGNOSIS — N3041 Irradiation cystitis with hematuria: Secondary | ICD-10-CM

## 2024-05-27 DIAGNOSIS — C61 Malignant neoplasm of prostate: Secondary | ICD-10-CM

## 2024-05-28 ENCOUNTER — Encounter (HOSPITAL_BASED_OUTPATIENT_CLINIC_OR_DEPARTMENT_OTHER): Payer: Self-pay | Admitting: Pulmonary Disease

## 2024-05-28 ENCOUNTER — Ambulatory Visit (HOSPITAL_BASED_OUTPATIENT_CLINIC_OR_DEPARTMENT_OTHER): Admitting: Internal Medicine

## 2024-05-28 ENCOUNTER — Ambulatory Visit (INDEPENDENT_AMBULATORY_CARE_PROVIDER_SITE_OTHER): Admitting: Pulmonary Disease

## 2024-05-28 VITALS — BP 105/92 | HR 81 | Ht 72.0 in | Wt 201.2 lb

## 2024-05-28 DIAGNOSIS — L7634 Postprocedural seroma of skin and subcutaneous tissue following other procedure: Secondary | ICD-10-CM | POA: Diagnosis not present

## 2024-05-28 DIAGNOSIS — N3040 Irradiation cystitis without hematuria: Secondary | ICD-10-CM

## 2024-05-28 DIAGNOSIS — C61 Malignant neoplasm of prostate: Secondary | ICD-10-CM | POA: Diagnosis not present

## 2024-05-28 DIAGNOSIS — G4733 Obstructive sleep apnea (adult) (pediatric): Secondary | ICD-10-CM

## 2024-05-28 DIAGNOSIS — N3041 Irradiation cystitis with hematuria: Secondary | ICD-10-CM | POA: Diagnosis not present

## 2024-05-28 DIAGNOSIS — Z9682 Presence of neurostimulator: Secondary | ICD-10-CM

## 2024-05-28 NOTE — Progress Notes (Signed)
 Subjective:    Patient ID: Kevin [name], male    DOB: 02-May-1956, 68 y.o.   MRN: [id number] 54 yoM former smoker followed for OSA/ Inspire     Dr Carlie- Inspire Implanted 05/15/23-   PMH :  recurrent DVT R leg/ Lupus anticoagulant/Xarelto, HTN, OSA, Prostate Cancer, Kidney Stone, Mycosis Fungoides (cutaneous T-cell lymphoma)   Inspire activation begun 06/24/23   04/2024 Dr Carlie >>protrusion at the generator site suggesting a fluid collection. Culture of aspirate was negative and antibiotic therapy did not help. We discussed the possibility of a tissue reaction to the device materials leading to fluid accumulation.   Discussed the use of AI scribe software for clinical note transcription with the patient, who gave verbal consent to proceed.  History of Present Illness  Discussed the use of AI scribe software for clinical note transcription with the patient, who gave verbal consent to proceed.  History of Present Illness   Kevin [name] is a 68 year old male with obstructive sleep apnea who presents for follow-up regarding device issues. He was previously seen by Dr. Carlie for device-related concerns.  He experiences a sensation of the obstructive sleep apnea simulator device 'pushing out', which remains pronounced despite previous fluid aspiration. Cultures from the aspirated fluid were negative for infection, but there is concern about fluid reaccumulation. His last visit was in early August 2025.  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  His last sleep study in January 2025 showed a very low number of events, although the device has not eliminated his snoring. The device is set at 1.6 volts, within a range of 1.4 to 1.8 volts. He requests an increase in the start delay and pause time to thirty minutes due to frequent nocturnal bathroom visits.  He reports sleeping well and feels  good during the daytime without needing naps. He maintains excellent compliance with the device, averaging 1.9 passes per night and achieving 90% compliance.         HST 11/08/22-AHI 11.5/hr, desat to 86%, body weight 210 lbs  NPSG Inspire Titration study 10/15/23- optimum at 1.6V (AHI 4.6/hr), body weight 205 lbs   Review of Systems  neg for any significant sore throat, dysphagia, itching, sneezing, nasal congestion or excess/ purulent secretions, fever, chills, sweats, unintended wt loss, pleuritic or exertional cp, hempoptysis, orthopnea pnd or change in chronic leg swelling. Also denies presyncope, palpitations, heartburn, abdominal pain, nausea, vomiting, diarrhea or change in bowel or urinary habits, dysuria,hematuria, rash, arthralgias, visual complaints, headache, numbness weakness or ataxia.      Objective:   Physical Exam  Gen. Pleasant, well-nourished, in no distress ENT - no thrush, no pallor/icterus, Neck: No JVD, no thyromegaly, no carotid bruits Lungs: no use of accessory muscles, no dullness to percussion, clear without rales or rhonchi , fluctuant swelling over inspire generator site Cardiovascular: Rhythm regular, heart sounds  normal, no murmurs or gallops, no peripheral edema Musculoskeletal: No deformities, no cyanosis or clubbing        Assessment & Plan:   Assessment and Plan Assessment & Plan   Assessment and Plan    Obstructive sleep apnea status post hypoglossal nerve stimulator implantation Obstructive sleep apnea managed with hypoglossal nerve stimulator. Device functioning well with excellent compliance (90%) and average passes of 1.9 per night. Current settings at 1.6 volts with a range of 1.4 to 1.8 volts. He reports good sleep  quality but requests adjustment to start delay and pause time due to frequent bathroom visits. - Increase start delay to 30 minutes and pause time to 30 minutes.  Seroma at hypoglossal nerve stimulator generator pocket without  evidence of infection Seroma formation at the hypoglossal nerve stimulator generator pocket. Fluid was aspirated by ENT and culture was negative, indicating no infection. The seroma is pronounced but not associated with redness, tenderness, or fever. The etiology is unclear, possibly an inflammatory response. No immediate intervention required unless symptoms worsen. - Monitor for changes in swelling or signs of infection such as redness, tenderness, or fever. - Communicate with Dr. Carlie regarding the seroma and any further guidance. - Advise him to contact Dr. Carlie if symptoms worsen or if aspiration is needed again.  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 Currently undergoing hyperbaric oxygen  therapy, which is deemed safe with the hypoglossal nerve stimulator. He is on the third of forty treatments and reports no issues related to the therapy. - Continue hyperbaric oxygen  therapy as planned.

## 2024-05-28 NOTE — Patient Instructions (Addendum)
  VISIT SUMMARY: You came in today for a follow-up regarding your obstructive sleep apnea and issues with your device. We discussed your current device settings, your sleep quality, and the sensation of the device 'pushing out'. We also reviewed your ongoing hyperbaric oxygen  therapy for radiation cystitis.  YOUR PLAN: -OBSTRUCTIVE SLEEP APNEA: Obstructive sleep apnea is a condition where your airway becomes blocked during sleep, causing breathing pauses. Your condition is managed with a hypoglossal nerve stimulator, which is functioning well. You have excellent compliance with the device. We will increase the start delay and pause time to 30 minutes to accommodate your frequent bathroom visits at night.  -SEROMA AT HYPOGLOSSAL NERVE STIMULATOR GENERATOR POCKET: A seroma is a collection of fluid that builds up under the surface of your skin. You have a seroma at the site of your hypoglossal nerve stimulator generator pocket, but there is no sign of infection. Please monitor the area for any changes or signs of infection. Please contact Dr. Carlie if the swelling worsens or if you notice redness, tenderness, or fever.  -RADIATION CYSTITIS: Radiation cystitis is inflammation of the bladder due to radiation therapy. You are currently undergoing hyperbaric oxygen  therapy for this condition, which is safe to use with your hypoglossal nerve stimulator. Continue with your planned treatments.  INSTRUCTIONS: Please follow up with Dr. Carlie if you notice any changes in the swelling at the device site or if you experience any signs of infection. Continue with your hyperbaric oxygen  therapy as planned.                      Contains text generated by [employer]bridge.                                 Contains text generated by [employer]bridge.

## 2024-05-29 ENCOUNTER — Encounter (HOSPITAL_BASED_OUTPATIENT_CLINIC_OR_DEPARTMENT_OTHER): Admitting: Internal Medicine

## 2024-05-29 DIAGNOSIS — N3041 Irradiation cystitis with hematuria: Secondary | ICD-10-CM | POA: Diagnosis not present

## 2024-05-29 DIAGNOSIS — C61 Malignant neoplasm of prostate: Secondary | ICD-10-CM

## 2024-06-01 ENCOUNTER — Encounter (HOSPITAL_BASED_OUTPATIENT_CLINIC_OR_DEPARTMENT_OTHER): Admitting: Internal Medicine

## 2024-06-01 DIAGNOSIS — N3041 Irradiation cystitis with hematuria: Secondary | ICD-10-CM | POA: Diagnosis not present

## 2024-06-01 DIAGNOSIS — C61 Malignant neoplasm of prostate: Secondary | ICD-10-CM | POA: Diagnosis not present

## 2024-06-02 ENCOUNTER — Encounter (HOSPITAL_BASED_OUTPATIENT_CLINIC_OR_DEPARTMENT_OTHER): Admitting: Internal Medicine

## 2024-06-02 DIAGNOSIS — C61 Malignant neoplasm of prostate: Secondary | ICD-10-CM | POA: Diagnosis not present

## 2024-06-02 DIAGNOSIS — N3041 Irradiation cystitis with hematuria: Secondary | ICD-10-CM | POA: Diagnosis not present

## 2024-06-03 ENCOUNTER — Encounter (HOSPITAL_BASED_OUTPATIENT_CLINIC_OR_DEPARTMENT_OTHER): Admitting: General Surgery

## 2024-06-03 DIAGNOSIS — N3041 Irradiation cystitis with hematuria: Secondary | ICD-10-CM | POA: Diagnosis not present

## 2024-06-04 ENCOUNTER — Encounter (HOSPITAL_BASED_OUTPATIENT_CLINIC_OR_DEPARTMENT_OTHER): Admitting: Internal Medicine

## 2024-06-04 DIAGNOSIS — N3041 Irradiation cystitis with hematuria: Secondary | ICD-10-CM

## 2024-06-04 DIAGNOSIS — C61 Malignant neoplasm of prostate: Secondary | ICD-10-CM

## 2024-06-05 ENCOUNTER — Encounter (HOSPITAL_BASED_OUTPATIENT_CLINIC_OR_DEPARTMENT_OTHER): Admitting: Internal Medicine

## 2024-06-09 ENCOUNTER — Inpatient Hospital Stay: Payer: BC Managed Care – PPO | Attending: Hematology and Oncology | Admitting: Hematology and Oncology

## 2024-06-09 ENCOUNTER — Inpatient Hospital Stay

## 2024-06-09 ENCOUNTER — Encounter (HOSPITAL_BASED_OUTPATIENT_CLINIC_OR_DEPARTMENT_OTHER): Attending: Internal Medicine | Admitting: Internal Medicine

## 2024-06-09 VITALS — BP 145/89 | HR 60 | Temp 98.4°F | Resp 18 | Ht 72.0 in

## 2024-06-09 DIAGNOSIS — C84A0 Cutaneous T-cell lymphoma, unspecified, unspecified site: Secondary | ICD-10-CM | POA: Insufficient documentation

## 2024-06-09 DIAGNOSIS — I82401 Acute embolism and thrombosis of unspecified deep veins of right lower extremity: Secondary | ICD-10-CM

## 2024-06-09 DIAGNOSIS — C61 Malignant neoplasm of prostate: Secondary | ICD-10-CM | POA: Diagnosis not present

## 2024-06-09 DIAGNOSIS — Z86718 Personal history of other venous thrombosis and embolism: Secondary | ICD-10-CM | POA: Diagnosis present

## 2024-06-09 DIAGNOSIS — Y842 Radiological procedure and radiotherapy as the cause of abnormal reaction of the patient, or of later complication, without mention of misadventure at the time of the procedure: Secondary | ICD-10-CM | POA: Diagnosis not present

## 2024-06-09 DIAGNOSIS — N3041 Irradiation cystitis with hematuria: Secondary | ICD-10-CM | POA: Insufficient documentation

## 2024-06-09 DIAGNOSIS — Z923 Personal history of irradiation: Secondary | ICD-10-CM | POA: Diagnosis not present

## 2024-06-09 DIAGNOSIS — D6862 Lupus anticoagulant syndrome: Secondary | ICD-10-CM | POA: Insufficient documentation

## 2024-06-09 DIAGNOSIS — Z7901 Long term (current) use of anticoagulants: Secondary | ICD-10-CM | POA: Diagnosis present

## 2024-06-09 NOTE — Assessment & Plan Note (Signed)
 11/13/2021: Acute occlusive DVT of the right femoral and popliteal veins  History of prostate cancer initially diagnosed Gleason score 9 T1c 03/06/2016 Radical prostatectomy 07/18/2016 at [facility] PSA increased 06/07/2017 ADT initiated 10/21/2017 Zytiga added 02/03/2018 [facility] radiation 2009   History of CTCL: Currently on interferon injections   Hypercoagulability work-up 1.  Protein C: 44% (slightly deficient) 2. protein S and Antithrombin: Normal 3.  Lupus anticoagulant/antiphospholipid antibodies: Positive for lupus anticoagulant 4.  Prothrombin gene mutation: Not detected 5.  Factor V Leiden: Not detected

## 2024-06-09 NOTE — Progress Notes (Signed)
 Patient Care Team: Charlott Dorn LABOR, MD as PCP - General (Internal Medicine)  DIAGNOSIS:  Encounter Diagnosis  Name Primary?   Deep vein thrombosis (DVT) of right lower extremity, unspecified chronicity, unspecified vein (HCC) Yes      CHIEF COMPLIANT:   HISTORY OF PRESENT ILLNESS: Follow-up to discuss anticoagulation plan  History of Present Illness Kevin [name] is a 68 year old male with a history of CTCL and prostate cancer who presents for evaluation of blood thinner necessity due to a past positive antiphospholipid antibody test.  He has a history of a positive antiphospholipid antibody test, though it is unclear if it was confirmed with a repeat test. He is currently on a blood thinner due to this previous test result.  He is managing cutaneous T-cell lymphoma (CTCL) with a generic form of Targretin and Valchlor cream, primarily affecting his legs and arms. His CTCL is under the care of [facility] and is currently stable.  He is undergoing hyperbaric oxygen  therapy for radiation cystitis, a consequence of treatment for recurrent prostate cancer diagnosed in 2018. He is seven sessions into a planned forty-session course.     ALLERGIES:  is allergic to zestril [lisinopril].  MEDICATIONS:  Current Outpatient Medications  Medication Sig Dispense Refill   acetaminophen  (TYLENOL ) 500 MG tablet Take 500-1,000 mg by mouth every 6 (six) hours as needed for moderate pain.     amLODipine -valsartan  (EXFORGE ) 5-160 MG tablet Take 1 tablet by mouth in the morning.     atorvastatin (LIPITOR) 20 MG tablet Take 20 mg by mouth 2 (two) times daily.     bexarotene (TARGRETIN) 75 MG CAPS capsule Take 300 mg/m2 by mouth daily with breakfast. Give with food. Protect from light. CAUTION: Chemotherapy/Biotherapy     citalopram  (CELEXA ) 40 MG tablet Take 40 mg by mouth at bedtime.     Cyanocobalamin  5000 MCG CAPS Take 5,000 mcg by mouth every evening.     levothyroxine (SYNTHROID) 50  MCG tablet Take 50 mcg by mouth daily before breakfast.     MILK THISTLE PO Take 2,000 mg by mouth in the morning and at bedtime.     pantoprazole (PROTONIX) 40 MG tablet Take 40 mg by mouth 2 (two) times daily.     Psyllium (METAMUCIL PO) Take 1 Dose by mouth daily.     Red Yeast Rice 600 MG CAPS Take 600 mg by mouth 2 (two) times daily.     rivaroxaban (XARELTO) 20 MG TABS tablet Take 20 mg by mouth every evening.     triamcinolone  cream (KENALOG ) 0.1 % Apply 1 Application topically every other day.     VALCHLOR 0.016 % GEL Apply topically.     No current facility-administered medications for this visit.    PHYSICAL EXAMINATION: ECOG PERFORMANCE STATUS: 1 - Symptomatic but completely ambulatory  Vitals:   06/09/24 0847  BP: (!) 145/89  Pulse: 60  Resp: 18  Temp: 98.4 F (36.9 C)  SpO2: 100%   There were no vitals filed for this visit.    LABORATORY DATA:  I have reviewed the data as listed    Latest Ref Rng & Units 06/06/2023   10:56 AM 05/15/2023   10:28 AM 03/19/2022    7:32 AM  CMP  Glucose 70 - 99 mg/dL 862  896  884   BUN 8 - 23 mg/dL 15  13  14    Creatinine 0.61 - 1.24 mg/dL 8.87  9.08  9.09   Sodium 135 - 145  mmol/L 137  136  139   Potassium 3.5 - 5.1 mmol/L 4.1  4.1  3.9   Chloride 98 - 111 mmol/L 105  102  106   CO2 22 - 32 mmol/L 26  20    Calcium 8.9 - 10.3 mg/dL 9.5  9.1    Total Protein 6.5 - 8.1 g/dL 7.1     Total Bilirubin 0.3 - 1.2 mg/dL 0.7     Alkaline Phos 38 - 126 U/L 81     AST 15 - 41 U/L 60     ALT 0 - 44 U/L 70       Lab Results  Component Value Date   WBC 3.7 (L) 06/06/2023   HGB 13.5 06/06/2023   HCT 38.6 (L) 06/06/2023   MCV 92.6 06/06/2023   PLT 143 (L) 06/06/2023   NEUTROABS 2.2 06/06/2023    ASSESSMENT & PLAN:  Right leg DVT (HCC) 11/13/2021: Acute occlusive DVT of the right femoral and popliteal veins  History of prostate cancer initially diagnosed Gleason score 9 T1c 03/06/2016 Radical prostatectomy 07/18/2016 at [facility] PSA  increased 06/07/2017 ADT initiated 10/21/2017 Zytiga added 02/03/2018 [facility] radiation 2009   History of CTCL: Currently on interferon injections   Hypercoagulability work-up 1.  Protein C: 44% (slightly deficient) 2. protein S and Antithrombin: Normal 3.  Lupus anticoagulant/antiphospholipid antibodies: Positive for lupus anticoagulant 4.  Prothrombin gene mutation: Not detected 5.  Factor V Leiden: Not detected  Plan: We will repeat lupus anticoagulant testing.  If repeat testing is positive then he will remain on Xarelto indefinitely.  If the repeat testing is negative then I would recommend discontinuation of treatment.  He is in agreement. I will call him in a week to discuss results of the tests.  Radiation cystitis: Undergoing hyperbaric oxygen  therapy. Assessment & Plan   Cutaneous T-cell lymphoma (CTCL) CTCL managed with oral Targretin and topical Valchlor, affecting skin on legs and arms. Coordinated with [facility]. - Continue Targretin (bexarotene). - Continue Valchlor (mechlorethamine) cream.      No orders of the defined types were placed in this encounter.  The patient has a good understanding of the overall plan. he agrees with it. he will call with any problems that may develop before the next visit here. Total time spent: 30 mins including face to face time and time spent for planning, charting and co-ordination of care   Naomi MARLA Chad, MD 06/09/24

## 2024-06-10 ENCOUNTER — Encounter (HOSPITAL_BASED_OUTPATIENT_CLINIC_OR_DEPARTMENT_OTHER): Admitting: Internal Medicine

## 2024-06-10 DIAGNOSIS — C61 Malignant neoplasm of prostate: Secondary | ICD-10-CM

## 2024-06-10 DIAGNOSIS — N3041 Irradiation cystitis with hematuria: Secondary | ICD-10-CM

## 2024-06-10 LAB — LUPUS ANTICOAGULANT PANEL
DRVVT: 100.8 s — ABNORMAL HIGH (ref 0.0–47.0)
PTT Lupus Anticoagulant: 43 s (ref 0.0–43.5)

## 2024-06-10 LAB — BETA-2-GLYCOPROTEIN I ABS, IGG/M/A
Beta-2 Glyco I IgG: <9 GPI IgG units (ref 0–20)
Beta-2-Glycoprotein I IgA: <9 GPI IgA units (ref 0–25)
Beta-2-Glycoprotein I IgM: <9 GPI IgM units (ref 0–32)

## 2024-06-10 LAB — CARDIOLIPIN ANTIBODIES, IGG, IGM, IGA
Anticardiolipin IgA: <9 U/mL (ref 0–11)
Anticardiolipin IgG: 19 GPL U/mL — ABNORMAL HIGH (ref 0–14)
Anticardiolipin IgM: <9 [MPL'U]/mL (ref 0–12)

## 2024-06-10 LAB — DRVVT MIX: dRVVT Mix: 65 s — ABNORMAL HIGH (ref 0.0–40.4)

## 2024-06-10 LAB — DRVVT CONFIRM: dRVVT Confirm: 1.6 ratio — ABNORMAL HIGH (ref 0.8–1.2)

## 2024-06-11 ENCOUNTER — Encounter (HOSPITAL_BASED_OUTPATIENT_CLINIC_OR_DEPARTMENT_OTHER): Admitting: Internal Medicine

## 2024-06-11 DIAGNOSIS — N3041 Irradiation cystitis with hematuria: Secondary | ICD-10-CM | POA: Diagnosis not present

## 2024-06-11 DIAGNOSIS — C61 Malignant neoplasm of prostate: Secondary | ICD-10-CM | POA: Diagnosis not present

## 2024-06-11 MED ORDER — LEVOTHYROXINE 50 MCG TABLET
ORAL_TABLET | Freq: Every day | ORAL | 3 refills | 90.00000 days
Start: 2024-06-11 — End: ?

## 2024-06-11 MED ORDER — LEVOTHYROXINE 100 MCG TABLET
ORAL_TABLET | Freq: Every day | ORAL | 3 refills | 90.00000 days
Start: 2024-06-11 — End: ?

## 2024-06-11 MED ORDER — ATORVASTATIN 40 MG TABLET
ORAL_TABLET | Freq: Every day | ORAL | 3 refills | 90.00000 days
Start: 2024-06-11 — End: ?

## 2024-06-12 ENCOUNTER — Ambulatory Visit (HOSPITAL_BASED_OUTPATIENT_CLINIC_OR_DEPARTMENT_OTHER): Admitting: Internal Medicine

## 2024-06-12 DIAGNOSIS — C61 Malignant neoplasm of prostate: Secondary | ICD-10-CM

## 2024-06-12 DIAGNOSIS — N3041 Irradiation cystitis with hematuria: Secondary | ICD-10-CM | POA: Diagnosis not present

## 2024-06-15 ENCOUNTER — Encounter (HOSPITAL_BASED_OUTPATIENT_CLINIC_OR_DEPARTMENT_OTHER): Admitting: Internal Medicine

## 2024-06-15 DIAGNOSIS — C61 Malignant neoplasm of prostate: Secondary | ICD-10-CM | POA: Diagnosis not present

## 2024-06-15 DIAGNOSIS — N3041 Irradiation cystitis with hematuria: Secondary | ICD-10-CM

## 2024-06-16 ENCOUNTER — Encounter (HOSPITAL_BASED_OUTPATIENT_CLINIC_OR_DEPARTMENT_OTHER): Admitting: Internal Medicine

## 2024-06-16 DIAGNOSIS — C61 Malignant neoplasm of prostate: Secondary | ICD-10-CM

## 2024-06-16 DIAGNOSIS — N3041 Irradiation cystitis with hematuria: Secondary | ICD-10-CM | POA: Diagnosis not present

## 2024-06-17 ENCOUNTER — Encounter (HOSPITAL_BASED_OUTPATIENT_CLINIC_OR_DEPARTMENT_OTHER): Admitting: Internal Medicine

## 2024-06-17 DIAGNOSIS — C61 Malignant neoplasm of prostate: Secondary | ICD-10-CM | POA: Diagnosis not present

## 2024-06-17 DIAGNOSIS — N3041 Irradiation cystitis with hematuria: Secondary | ICD-10-CM | POA: Diagnosis not present

## 2024-06-18 ENCOUNTER — Inpatient Hospital Stay (HOSPITAL_BASED_OUTPATIENT_CLINIC_OR_DEPARTMENT_OTHER): Admitting: Hematology and Oncology

## 2024-06-18 ENCOUNTER — Encounter (HOSPITAL_BASED_OUTPATIENT_CLINIC_OR_DEPARTMENT_OTHER): Admitting: Internal Medicine

## 2024-06-18 DIAGNOSIS — N3041 Irradiation cystitis with hematuria: Secondary | ICD-10-CM | POA: Diagnosis not present

## 2024-06-18 DIAGNOSIS — C61 Malignant neoplasm of prostate: Secondary | ICD-10-CM | POA: Diagnosis not present

## 2024-06-18 DIAGNOSIS — I82401 Acute embolism and thrombosis of unspecified deep veins of right lower extremity: Secondary | ICD-10-CM | POA: Diagnosis not present

## 2024-06-18 NOTE — Assessment & Plan Note (Signed)
 11/13/2021: Acute occlusive DVT of the right femoral and popliteal veins  History of prostate cancer initially diagnosed Gleason score 9 T1c 03/06/2016 Radical prostatectomy 07/18/2016 at [facility] PSA increased 06/07/2017 ADT initiated 10/21/2017 Zytiga added 02/03/2018 [facility] radiation 2009   History of CTCL: Currently on interferon injections   Hypercoagulability work-up 1.  Protein C: 44% (slightly deficient) 2. protein S and Antithrombin: Normal 3.  Lupus anticoagulant/antiphospholipid antibodies: Positive for lupus anticoagulant (repeat testing 06/09/2024: Still positive) 4.  Prothrombin gene mutation: Not detected 5.  Factor V Leiden: Not detected --------------------------------------------------------------------------------------------------------------------------------------------------- Recommendation: Indefinite anticoagulation with Xarelto Follow-up with primary care for annual checks

## 2024-06-18 NOTE — Progress Notes (Signed)
 HEMATOLOGY-ONCOLOGY TELEPHONE VISIT PROGRESS NOTE  I connected with our patient on 06/18/24 at 10:00 AM EDT by telephone and verified that I am speaking with the correct person using two identifiers.  I discussed the limitations, risks, security and privacy concerns of performing an evaluation and management service by telephone and the availability of in person appointments.  I also discussed with the patient that there may be a patient responsible charge related to this service. The patient expressed understanding and agreed to proceed.   History of Present Illness: Follow-up to discuss results of lupus anticoagulant testing  History of Present Illness Kevin [name] is a 68 year old male with lupus anticoagulant who presents for follow-up on blood work results.  Lupus anticoagulant is confirmed by repeat testing with continued positivity. He is on Xarelto and experiences daily hematuria. There is a history of deep vein thrombosis in the right lower extremity, managed with anticoagulation therapy.      REVIEW OF SYSTEMS:   Constitutional: Denies fevers, chills or abnormal weight loss All other systems were reviewed with the patient and are negative. Observations/Objective:     Assessment Plan:  Right leg DVT (HCC) 11/13/2021: Acute occlusive DVT of the right femoral and popliteal veins  History of prostate cancer initially diagnosed Gleason score 9 T1c 03/06/2016 Radical prostatectomy 07/18/2016 at [facility] PSA increased 06/07/2017 ADT initiated 10/21/2017 Zytiga added 02/03/2018 [facility] radiation 2009   History of CTCL: Currently on interferon injections   Hypercoagulability work-up 1.  Protein C: 44% (slightly deficient) 2. protein S and Antithrombin: Normal 3.  Lupus anticoagulant/antiphospholipid antibodies: Positive for lupus anticoagulant (repeat testing 06/09/2024: Still positive) 4.  Prothrombin gene mutation: Not detected 5.  Factor V Leiden: Not  detected --------------------------------------------------------------------------------------------------------------------------------------------------- Recommendation: Because there is a small probability that Xarelto can give false positives, I recommended that he discontinue Xarelto for 3 days and repeat the blood work next Tuesday. If the repeat is also positive then he will need indefinite anticoagulation with Xarelto The problem is because of radiation cystitis he bleeds constantly and with Xarelto he probably be bleeding a lot more.   Telephone visit after that to discuss results  Assessment & Plan Lupus anticoagulant positivity with deep vein thrombosis Repeat test positive, indicating ongoing antibody production and thrombosis risk if anticoagulation stops. Discussed 10% false positivity risk due to Rivaroxaban. - Discontinue Rivaroxaban for three days before retesting. - Schedule lupus anticoagulant test on June 23, 2024, at 9 AM. - Resume Rivaroxaban post-test.  Radiation cystitis with hematuria Chronic radiation cystitis with daily hematuria, possibly worsened by anticoagulation therapy.      I discussed the assessment and treatment plan with the patient. The patient was provided an opportunity to ask questions and all were answered. The patient agreed with the plan and demonstrated an understanding of the instructions. The patient was advised to call back or seek an in-person evaluation if the symptoms worsen or if the condition fails to improve as anticipated.   I provided 20 minutes of non-face-to-face time during this encounter.  This includes time for charting and coordination of care   Naomi MARLA Chad, MD

## 2024-06-19 ENCOUNTER — Ambulatory Visit (HOSPITAL_BASED_OUTPATIENT_CLINIC_OR_DEPARTMENT_OTHER): Admitting: Internal Medicine

## 2024-06-19 DIAGNOSIS — N3041 Irradiation cystitis with hematuria: Secondary | ICD-10-CM | POA: Diagnosis not present

## 2024-06-19 DIAGNOSIS — C61 Malignant neoplasm of prostate: Secondary | ICD-10-CM | POA: Diagnosis not present

## 2024-06-22 ENCOUNTER — Encounter (HOSPITAL_BASED_OUTPATIENT_CLINIC_OR_DEPARTMENT_OTHER): Admitting: Internal Medicine

## 2024-06-22 DIAGNOSIS — Z923 Personal history of irradiation: Secondary | ICD-10-CM | POA: Diagnosis not present

## 2024-06-22 DIAGNOSIS — N3041 Irradiation cystitis with hematuria: Secondary | ICD-10-CM

## 2024-06-22 DIAGNOSIS — C61 Malignant neoplasm of prostate: Secondary | ICD-10-CM

## 2024-06-23 ENCOUNTER — Inpatient Hospital Stay

## 2024-06-23 ENCOUNTER — Encounter (HOSPITAL_BASED_OUTPATIENT_CLINIC_OR_DEPARTMENT_OTHER): Admitting: Internal Medicine

## 2024-06-23 DIAGNOSIS — N3041 Irradiation cystitis with hematuria: Secondary | ICD-10-CM | POA: Diagnosis not present

## 2024-06-23 DIAGNOSIS — I82401 Acute embolism and thrombosis of unspecified deep veins of right lower extremity: Secondary | ICD-10-CM

## 2024-06-23 DIAGNOSIS — C61 Malignant neoplasm of prostate: Secondary | ICD-10-CM

## 2024-06-23 DIAGNOSIS — Z923 Personal history of irradiation: Secondary | ICD-10-CM

## 2024-06-23 DIAGNOSIS — Z86718 Personal history of other venous thrombosis and embolism: Secondary | ICD-10-CM | POA: Diagnosis not present

## 2024-06-24 ENCOUNTER — Encounter (HOSPITAL_BASED_OUTPATIENT_CLINIC_OR_DEPARTMENT_OTHER): Admitting: Internal Medicine

## 2024-06-24 DIAGNOSIS — Z923 Personal history of irradiation: Secondary | ICD-10-CM

## 2024-06-24 DIAGNOSIS — N3041 Irradiation cystitis with hematuria: Secondary | ICD-10-CM | POA: Diagnosis not present

## 2024-06-24 DIAGNOSIS — C61 Malignant neoplasm of prostate: Secondary | ICD-10-CM

## 2024-06-24 LAB — LUPUS ANTICOAGULANT PANEL
DRVVT: 36.9 s (ref 0.0–47.0)
PTT Lupus Anticoagulant: 32.5 s (ref 0.0–43.5)

## 2024-06-25 ENCOUNTER — Encounter (HOSPITAL_BASED_OUTPATIENT_CLINIC_OR_DEPARTMENT_OTHER): Admitting: Internal Medicine

## 2024-06-25 DIAGNOSIS — Z923 Personal history of irradiation: Secondary | ICD-10-CM

## 2024-06-25 DIAGNOSIS — C61 Malignant neoplasm of prostate: Secondary | ICD-10-CM | POA: Diagnosis not present

## 2024-06-25 DIAGNOSIS — N3041 Irradiation cystitis with hematuria: Secondary | ICD-10-CM | POA: Diagnosis not present

## 2024-06-26 ENCOUNTER — Encounter (HOSPITAL_BASED_OUTPATIENT_CLINIC_OR_DEPARTMENT_OTHER): Admitting: Internal Medicine

## 2024-06-26 DIAGNOSIS — N3041 Irradiation cystitis with hematuria: Secondary | ICD-10-CM

## 2024-06-26 DIAGNOSIS — C61 Malignant neoplasm of prostate: Secondary | ICD-10-CM | POA: Diagnosis not present

## 2024-06-26 DIAGNOSIS — Z923 Personal history of irradiation: Secondary | ICD-10-CM

## 2024-06-29 ENCOUNTER — Encounter (HOSPITAL_BASED_OUTPATIENT_CLINIC_OR_DEPARTMENT_OTHER): Admitting: Internal Medicine

## 2024-06-29 DIAGNOSIS — C61 Malignant neoplasm of prostate: Secondary | ICD-10-CM

## 2024-06-29 DIAGNOSIS — Z923 Personal history of irradiation: Secondary | ICD-10-CM

## 2024-06-29 DIAGNOSIS — N3041 Irradiation cystitis with hematuria: Secondary | ICD-10-CM

## 2024-06-30 ENCOUNTER — Inpatient Hospital Stay: Admitting: Hematology and Oncology

## 2024-06-30 ENCOUNTER — Encounter (HOSPITAL_BASED_OUTPATIENT_CLINIC_OR_DEPARTMENT_OTHER): Admitting: Internal Medicine

## 2024-06-30 DIAGNOSIS — Z923 Personal history of irradiation: Secondary | ICD-10-CM | POA: Diagnosis not present

## 2024-06-30 DIAGNOSIS — N3041 Irradiation cystitis with hematuria: Secondary | ICD-10-CM

## 2024-06-30 DIAGNOSIS — C61 Malignant neoplasm of prostate: Secondary | ICD-10-CM

## 2024-06-30 DIAGNOSIS — I82401 Acute embolism and thrombosis of unspecified deep veins of right lower extremity: Secondary | ICD-10-CM | POA: Diagnosis not present

## 2024-06-30 NOTE — Assessment & Plan Note (Signed)
 11/13/2021: Acute occlusive DVT of the right femoral and popliteal veins  History of prostate cancer initially diagnosed Gleason score 9 T1c 03/06/2016 Radical prostatectomy 07/18/2016 at [facility] PSA increased 06/07/2017 ADT initiated 10/21/2017 Zytiga added 02/03/2018 [facility] radiation 2009   History of CTCL: Currently on interferon injections   Hypercoagulability work-up 1.  Protein C: 44% (slightly deficient) 2. protein S and Antithrombin: Normal 3.  Lupus anticoagulant/antiphospholipid antibodies: Positive for lupus anticoagulant (repeat testing 06/09/2024: Still positive) Repeat testing on 06/23/2024: No lupus anticoagulant detected (this was done after holding Xarelto) 4.  Prothrombin gene mutation: Not detected 5.  Factor V Leiden: Not detected  Based on that repeat testing I recommended obtaining another ultrasound to confirm resolution of blood clots before stopping anticoagulation.

## 2024-06-30 NOTE — Progress Notes (Signed)
 HEMATOLOGY-ONCOLOGY TELEPHONE VISIT PROGRESS NOTE  I connected with our patient on 06/30/24 at  9:00 AM EDT by telephone and verified that I am speaking with the correct person using two identifiers.  I discussed the limitations, risks, security and privacy concerns of performing an evaluation and management service by telephone and the availability of in person appointments.  I also discussed with the patient that there may be a patient responsible charge related to this service. The patient expressed understanding and agreed to proceed.   History of Present Illness:   History of Present Illness Kevin [name] is a 68 year old male with a history of deep vein thrombosis who presents for follow-up regarding blood thinner management.  He experienced his first DVT in February 2019 after a long flight and another in February 2023. He has been on anticoagulation therapy since then. Recent blood work on June 23, 2024, showed no lupus anticoagulant. He is concerned about DVT recurrence during long flights and plans to travel to [place] in mid-October. He inquires about using Lovenox  injections or Xarelto for travel. During a four-day break from Xarelto for blood testing, his hematuria improved, but it returned upon resuming Xarelto. He also experiences a subjective feeling of ankle swelling when off Xarelto, though no recent ultrasound has been performed to check for residual clots.    REVIEW OF SYSTEMS:   Constitutional: Denies fevers, chills or abnormal weight loss All other systems were reviewed with the patient and are negative. Observations/Objective:     Assessment Plan:  Right leg DVT (HCC)  2019: DVT (after a plane ride) 11/13/2021: Acute occlusive DVT of the right femoral and popliteal veins  History of prostate cancer initially diagnosed Gleason score 9 T1c 03/06/2016 Radical prostatectomy 07/18/2016 at [facility] PSA increased 06/07/2017 ADT initiated 10/21/2017 Zytiga added  02/03/2018 [facility] radiation 2009   History of CTCL: Currently on interferon injections   Hypercoagulability work-up 1.  Protein C: 44% (slightly deficient) 2. protein S and Antithrombin: Normal 3.  Lupus anticoagulant/antiphospholipid antibodies: Positive for lupus anticoagulant (repeat testing 06/09/2024: Still positive) Repeat testing on 06/23/2024: No lupus anticoagulant detected (this was done after holding Xarelto) 4.  Prothrombin gene mutation: Not detected 5.  Factor V Leiden: Not detected  Based on that repeat testing I recommended obtaining another ultrasound to confirm resolution of blood clots before stopping anticoagulation. If he does come off anticoagulation, I recommended that when he goes on long plane journeys that he take the Xarelto for 3 days. Assessment & Plan Deep vein thrombosis of right lower extremity Initial lupus anticoagulant test was false positive. DVT in 2019 linked to prolonged flight, indicating provoked event. Current labs show no cause for recurrent DVTs. He prefers stopping anticoagulation due to radiation cystitis impact. - Order ultrasound of right lower extremity to check for residual clot. - Stop Xarelto after confirming no residual clot on ultrasound. - Advise wearing compression socks and ensuring mobility during long flights. - Consider Xarelto or Lovenox  prophylaxis for future long flights.  Radiation cystitis with hematuria Hematuria resolves when Xarelto is stopped, indicating exacerbation by anticoagulation. He prefers stopping Xarelto. - Stop Xarelto after confirming no residual clot on ultrasound.      I discussed the assessment and treatment plan with the patient. The patient was provided an opportunity to ask questions and all were answered. The patient agreed with the plan and demonstrated an understanding of the instructions. The patient was advised to call back or seek an in-person evaluation if the symptoms worsen  or if the condition  fails to improve as anticipated.   I provided 20 minutes of non-face-to-face time during this encounter.  This includes time for charting and coordination of care   Naomi MARLA Chad, MD

## 2024-07-01 ENCOUNTER — Telehealth: Admitting: Hematology and Oncology

## 2024-07-01 ENCOUNTER — Encounter (HOSPITAL_BASED_OUTPATIENT_CLINIC_OR_DEPARTMENT_OTHER): Admitting: Internal Medicine

## 2024-07-01 DIAGNOSIS — C61 Malignant neoplasm of prostate: Secondary | ICD-10-CM

## 2024-07-01 DIAGNOSIS — Z923 Personal history of irradiation: Secondary | ICD-10-CM

## 2024-07-01 DIAGNOSIS — N3041 Irradiation cystitis with hematuria: Secondary | ICD-10-CM

## 2024-07-02 ENCOUNTER — Ambulatory Visit (HOSPITAL_BASED_OUTPATIENT_CLINIC_OR_DEPARTMENT_OTHER): Admitting: Internal Medicine

## 2024-07-02 ENCOUNTER — Ambulatory Visit (HOSPITAL_COMMUNITY)
Admission: RE | Admit: 2024-07-02 | Discharge: 2024-07-02 | Disposition: A | Discharge status: Home / Self Care | Source: Ambulatory Visit | Attending: Hematology and Oncology | Admitting: Hematology and Oncology

## 2024-07-02 ENCOUNTER — Inpatient Hospital Stay: Admitting: Hematology and Oncology

## 2024-07-02 DIAGNOSIS — Z923 Personal history of irradiation: Secondary | ICD-10-CM

## 2024-07-02 DIAGNOSIS — C61 Malignant neoplasm of prostate: Secondary | ICD-10-CM

## 2024-07-02 DIAGNOSIS — I82401 Acute embolism and thrombosis of unspecified deep veins of right lower extremity: Secondary | ICD-10-CM | POA: Insufficient documentation

## 2024-07-02 DIAGNOSIS — N3041 Irradiation cystitis with hematuria: Secondary | ICD-10-CM | POA: Diagnosis not present

## 2024-07-02 NOTE — Assessment & Plan Note (Signed)
 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  Protein C: 44% (slightly deficient) 2. protein S and Antithrombin: Normal 3.  Lupus anticoagulant/antiphospholipid antibodies: Positive for lupus anticoagulant (repeat testing 06/09/2024: Still positive) Repeat testing on 06/23/2024: No lupus anticoagulant detected (this was done after holding Xarelto) 4.  Prothrombin gene mutation: Not detected 5.  Factor V Leiden: Not detected   07/02/2024: Ultrasound right leg:  If there is no further evidence of DVT then he can stop anticoagulation.

## 2024-07-03 ENCOUNTER — Encounter (HOSPITAL_BASED_OUTPATIENT_CLINIC_OR_DEPARTMENT_OTHER): Admitting: Internal Medicine

## 2024-07-03 DIAGNOSIS — C61 Malignant neoplasm of prostate: Secondary | ICD-10-CM

## 2024-07-03 DIAGNOSIS — N3041 Irradiation cystitis with hematuria: Secondary | ICD-10-CM

## 2024-07-03 DIAGNOSIS — Z923 Personal history of irradiation: Secondary | ICD-10-CM | POA: Diagnosis not present

## 2024-07-06 ENCOUNTER — Encounter (HOSPITAL_BASED_OUTPATIENT_CLINIC_OR_DEPARTMENT_OTHER): Admitting: Internal Medicine

## 2024-07-06 ENCOUNTER — Inpatient Hospital Stay (HOSPITAL_BASED_OUTPATIENT_CLINIC_OR_DEPARTMENT_OTHER): Admitting: Hematology and Oncology

## 2024-07-06 DIAGNOSIS — C61 Malignant neoplasm of prostate: Secondary | ICD-10-CM

## 2024-07-06 DIAGNOSIS — Z923 Personal history of irradiation: Secondary | ICD-10-CM | POA: Diagnosis not present

## 2024-07-06 DIAGNOSIS — I82401 Acute embolism and thrombosis of unspecified deep veins of right lower extremity: Secondary | ICD-10-CM | POA: Diagnosis not present

## 2024-07-06 DIAGNOSIS — N3041 Irradiation cystitis with hematuria: Secondary | ICD-10-CM | POA: Diagnosis not present

## 2024-07-06 NOTE — Assessment & Plan Note (Signed)
 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  Protein C: 44% (slightly deficient) 2. protein S and Antithrombin: Normal 3.  Lupus anticoagulant/antiphospholipid antibodies: Positive for lupus anticoagulant (repeat testing 06/09/2024: Still positive) Repeat testing on 06/23/2024: No lupus anticoagulant detected (this was done after holding Xarelto) 4.  Prothrombin gene mutation: Not detected 5.  Factor V Leiden: Not detected   07/02/2024: Ultrasound right leg: Chronic DVT involving right femoral vein, right popliteal vein chronic superficial vein thrombosis involving the right superficial veins/varicosities   Recommendation: Continuation of anticoagulation and recheck ultrasound in 6 months and make a decision on antiestrogen therapy

## 2024-07-06 NOTE — Progress Notes (Signed)
 This encounter was created in error - please disregard.

## 2024-07-06 NOTE — Progress Notes (Signed)
 Hello Kevin [name] TELEPHONE VISIT PROGRESS NOTE  I connected with our patient on 07/06/24 at 11:00 AM EDT by telephone and verified that I am speaking with the correct person using two identifiers.  I discussed the limitations, risks, security and privacy concerns of performing an evaluation and management service by telephone and the availability of in person appointments.  I also discussed with the patient that there may be a patient responsible charge related to this service. The patient expressed understanding and agreed to proceed.   History of Present Illness:    History of Present Illness Kevin [name] is a 68 year old male with chronic thrombus in the right leg who presents for follow-up regarding blood clot management.  He has a chronic thrombus in the right leg, confirmed by ultrasound, and has been managing it with Xarelto. He discontinued Xarelto nearly a week ago due to bleeding concerns. Following this, he experienced hematuria after a long walk. He is undergoing hyperbaric oxygen  therapy, with 15 sessions remaining out of 40. There is no surgical option for the chronic DVT as it is not suitable for mechanical intervention.     REVIEW OF SYSTEMS:   Constitutional: Denies fevers, chills or abnormal weight loss All other systems were reviewed with the patient and are negative. 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  Protein C: 44% (slightly deficient) 2. protein S and Antithrombin: Normal 3.  Lupus anticoagulant/antiphospholipid  antibodies: Positive for lupus anticoagulant (repeat testing 06/09/2024: Still positive) Repeat testing on 06/23/2024: No lupus anticoagulant detected (this was done after holding Xarelto) 4.  Prothrombin gene mutation: Not detected 5.  Factor V Leiden: Not detected   07/02/2024: Ultrasound right leg: Chronic DVT involving right femoral vein, right popliteal vein chronic superficial vein thrombosis involving the right superficial veins/varicosities   Recommendation: Continuation of anticoagulation and recheck ultrasound in 6 months and make a decision on anticoagulation therapy Patient is not very happy to hear that because he is experiencing Bladder related bleeding However he is willing to go back on it.  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 Persistent risk for recurrent thromboembolism if anticoagulation is discontinued. No mechanical intervention available. - Continue Xarelto indefinitely. - Schedule ultrasound of the right leg in six months.  Radiation cystitis with hematuria Intermittent hematuria. Bleeding ceased after Xarelto discontinuation but resumed with physical activity. Resumption of anticoagulation necessary despite ongoing HBO therapy due to clotting risk. - Resume Xarelto immediately. - Continue hyperbaric oxygen  therapy as planned.      I discussed the assessment and treatment plan with the patient. The patient was provided an opportunity to ask questions and all were answered. The patient agreed with the plan and demonstrated an understanding of the instructions. The patient was advised to call back or seek an in-person evaluation if the symptoms worsen or if the condition fails to improve as anticipated.   I provided 20 minutes of non-face-to-face time during this encounter.  This includes time for charting and coordination of care   [facility]  MARLA Chad, MD

## 2024-07-07 ENCOUNTER — Encounter (HOSPITAL_BASED_OUTPATIENT_CLINIC_OR_DEPARTMENT_OTHER): Admitting: Internal Medicine

## 2024-07-07 DIAGNOSIS — N3041 Irradiation cystitis with hematuria: Secondary | ICD-10-CM | POA: Diagnosis not present

## 2024-07-08 ENCOUNTER — Encounter (HOSPITAL_BASED_OUTPATIENT_CLINIC_OR_DEPARTMENT_OTHER): Attending: Internal Medicine | Admitting: Internal Medicine

## 2024-07-08 DIAGNOSIS — Z923 Personal history of irradiation: Secondary | ICD-10-CM | POA: Diagnosis not present

## 2024-07-08 DIAGNOSIS — N3041 Irradiation cystitis with hematuria: Secondary | ICD-10-CM | POA: Insufficient documentation

## 2024-07-08 DIAGNOSIS — C61 Malignant neoplasm of prostate: Secondary | ICD-10-CM | POA: Insufficient documentation

## 2024-07-09 ENCOUNTER — Encounter (HOSPITAL_BASED_OUTPATIENT_CLINIC_OR_DEPARTMENT_OTHER): Admitting: Internal Medicine

## 2024-07-09 DIAGNOSIS — C61 Malignant neoplasm of prostate: Secondary | ICD-10-CM | POA: Diagnosis not present

## 2024-07-09 DIAGNOSIS — N3041 Irradiation cystitis with hematuria: Secondary | ICD-10-CM

## 2024-07-09 DIAGNOSIS — Z923 Personal history of irradiation: Secondary | ICD-10-CM

## 2024-07-10 ENCOUNTER — Encounter (HOSPITAL_BASED_OUTPATIENT_CLINIC_OR_DEPARTMENT_OTHER): Admitting: Internal Medicine

## 2024-07-10 DIAGNOSIS — N3041 Irradiation cystitis with hematuria: Secondary | ICD-10-CM | POA: Diagnosis not present

## 2024-07-10 DIAGNOSIS — Z923 Personal history of irradiation: Secondary | ICD-10-CM | POA: Diagnosis not present

## 2024-07-10 DIAGNOSIS — C61 Malignant neoplasm of prostate: Secondary | ICD-10-CM

## 2024-07-13 ENCOUNTER — Encounter (HOSPITAL_BASED_OUTPATIENT_CLINIC_OR_DEPARTMENT_OTHER): Admitting: Internal Medicine

## 2024-07-13 DIAGNOSIS — C61 Malignant neoplasm of prostate: Secondary | ICD-10-CM | POA: Diagnosis not present

## 2024-07-13 DIAGNOSIS — N3041 Irradiation cystitis with hematuria: Secondary | ICD-10-CM

## 2024-07-13 DIAGNOSIS — Z923 Personal history of irradiation: Secondary | ICD-10-CM | POA: Diagnosis not present

## 2024-07-14 ENCOUNTER — Encounter (HOSPITAL_BASED_OUTPATIENT_CLINIC_OR_DEPARTMENT_OTHER): Admitting: Internal Medicine

## 2024-07-14 DIAGNOSIS — Z923 Personal history of irradiation: Secondary | ICD-10-CM

## 2024-07-14 DIAGNOSIS — N3041 Irradiation cystitis with hematuria: Secondary | ICD-10-CM

## 2024-07-14 DIAGNOSIS — C61 Malignant neoplasm of prostate: Secondary | ICD-10-CM | POA: Diagnosis not present

## 2024-07-15 ENCOUNTER — Encounter (HOSPITAL_BASED_OUTPATIENT_CLINIC_OR_DEPARTMENT_OTHER): Admitting: Internal Medicine

## 2024-07-15 DIAGNOSIS — N3041 Irradiation cystitis with hematuria: Secondary | ICD-10-CM

## 2024-07-15 DIAGNOSIS — Z923 Personal history of irradiation: Secondary | ICD-10-CM

## 2024-07-15 DIAGNOSIS — C61 Malignant neoplasm of prostate: Secondary | ICD-10-CM

## 2024-07-16 ENCOUNTER — Encounter (HOSPITAL_BASED_OUTPATIENT_CLINIC_OR_DEPARTMENT_OTHER): Admitting: Internal Medicine

## 2024-07-16 DIAGNOSIS — Z923 Personal history of irradiation: Secondary | ICD-10-CM | POA: Diagnosis not present

## 2024-07-16 DIAGNOSIS — N3041 Irradiation cystitis with hematuria: Secondary | ICD-10-CM

## 2024-07-16 DIAGNOSIS — C61 Malignant neoplasm of prostate: Secondary | ICD-10-CM | POA: Diagnosis not present

## 2024-07-20 ENCOUNTER — Encounter (HOSPITAL_BASED_OUTPATIENT_CLINIC_OR_DEPARTMENT_OTHER): Admitting: Internal Medicine

## 2024-07-20 DIAGNOSIS — C61 Malignant neoplasm of prostate: Secondary | ICD-10-CM | POA: Diagnosis not present

## 2024-07-20 DIAGNOSIS — N3041 Irradiation cystitis with hematuria: Secondary | ICD-10-CM | POA: Diagnosis not present

## 2024-07-20 DIAGNOSIS — Z923 Personal history of irradiation: Secondary | ICD-10-CM

## 2024-07-21 ENCOUNTER — Encounter (HOSPITAL_BASED_OUTPATIENT_CLINIC_OR_DEPARTMENT_OTHER): Admitting: Internal Medicine

## 2024-07-21 DIAGNOSIS — Z923 Personal history of irradiation: Secondary | ICD-10-CM | POA: Diagnosis not present

## 2024-07-21 DIAGNOSIS — C61 Malignant neoplasm of prostate: Secondary | ICD-10-CM | POA: Diagnosis not present

## 2024-07-21 DIAGNOSIS — N3041 Irradiation cystitis with hematuria: Secondary | ICD-10-CM

## 2024-07-22 ENCOUNTER — Encounter (HOSPITAL_BASED_OUTPATIENT_CLINIC_OR_DEPARTMENT_OTHER): Admitting: Internal Medicine

## 2024-07-22 DIAGNOSIS — Z923 Personal history of irradiation: Secondary | ICD-10-CM | POA: Diagnosis not present

## 2024-07-22 DIAGNOSIS — N3041 Irradiation cystitis with hematuria: Secondary | ICD-10-CM

## 2024-07-22 DIAGNOSIS — C61 Malignant neoplasm of prostate: Secondary | ICD-10-CM

## 2024-07-23 ENCOUNTER — Encounter (HOSPITAL_BASED_OUTPATIENT_CLINIC_OR_DEPARTMENT_OTHER): Admitting: Internal Medicine

## 2024-07-23 DIAGNOSIS — N3041 Irradiation cystitis with hematuria: Secondary | ICD-10-CM | POA: Diagnosis not present

## 2024-07-23 DIAGNOSIS — Z923 Personal history of irradiation: Secondary | ICD-10-CM

## 2024-07-23 DIAGNOSIS — C61 Malignant neoplasm of prostate: Secondary | ICD-10-CM | POA: Diagnosis not present

## 2024-08-13 ENCOUNTER — Encounter (HOSPITAL_BASED_OUTPATIENT_CLINIC_OR_DEPARTMENT_OTHER): Attending: Internal Medicine | Admitting: Internal Medicine

## 2024-08-13 DIAGNOSIS — Z923 Personal history of irradiation: Secondary | ICD-10-CM | POA: Insufficient documentation

## 2024-08-13 DIAGNOSIS — C61 Malignant neoplasm of prostate: Secondary | ICD-10-CM | POA: Insufficient documentation

## 2024-08-13 DIAGNOSIS — N3041 Irradiation cystitis with hematuria: Secondary | ICD-10-CM | POA: Diagnosis not present

## 2024-08-19 ENCOUNTER — Encounter (HOSPITAL_BASED_OUTPATIENT_CLINIC_OR_DEPARTMENT_OTHER): Admitting: Internal Medicine

## 2024-08-19 DIAGNOSIS — C61 Malignant neoplasm of prostate: Secondary | ICD-10-CM

## 2024-08-19 DIAGNOSIS — N3041 Irradiation cystitis with hematuria: Secondary | ICD-10-CM

## 2024-08-19 DIAGNOSIS — Z923 Personal history of irradiation: Secondary | ICD-10-CM

## 2024-08-20 ENCOUNTER — Encounter (HOSPITAL_BASED_OUTPATIENT_CLINIC_OR_DEPARTMENT_OTHER): Admitting: General Surgery

## 2024-08-20 DIAGNOSIS — N3041 Irradiation cystitis with hematuria: Secondary | ICD-10-CM | POA: Diagnosis not present

## 2024-08-21 ENCOUNTER — Encounter (HOSPITAL_BASED_OUTPATIENT_CLINIC_OR_DEPARTMENT_OTHER): Admitting: General Surgery

## 2024-08-21 DIAGNOSIS — N3041 Irradiation cystitis with hematuria: Secondary | ICD-10-CM | POA: Diagnosis not present

## 2024-08-24 ENCOUNTER — Encounter (HOSPITAL_BASED_OUTPATIENT_CLINIC_OR_DEPARTMENT_OTHER): Admitting: General Surgery

## 2024-08-24 DIAGNOSIS — N3041 Irradiation cystitis with hematuria: Secondary | ICD-10-CM | POA: Diagnosis not present

## 2024-08-25 ENCOUNTER — Encounter (HOSPITAL_BASED_OUTPATIENT_CLINIC_OR_DEPARTMENT_OTHER): Admitting: Internal Medicine

## 2024-08-25 DIAGNOSIS — C61 Malignant neoplasm of prostate: Secondary | ICD-10-CM | POA: Diagnosis not present

## 2024-08-25 DIAGNOSIS — N3041 Irradiation cystitis with hematuria: Secondary | ICD-10-CM

## 2024-08-25 DIAGNOSIS — Z923 Personal history of irradiation: Secondary | ICD-10-CM

## 2024-08-26 ENCOUNTER — Encounter (HOSPITAL_BASED_OUTPATIENT_CLINIC_OR_DEPARTMENT_OTHER): Admitting: Internal Medicine

## 2024-08-26 DIAGNOSIS — C61 Malignant neoplasm of prostate: Secondary | ICD-10-CM | POA: Diagnosis not present

## 2024-08-26 DIAGNOSIS — Z923 Personal history of irradiation: Secondary | ICD-10-CM | POA: Diagnosis not present

## 2024-08-26 DIAGNOSIS — N3041 Irradiation cystitis with hematuria: Secondary | ICD-10-CM | POA: Diagnosis not present

## 2024-08-27 ENCOUNTER — Encounter (HOSPITAL_BASED_OUTPATIENT_CLINIC_OR_DEPARTMENT_OTHER): Admitting: General Surgery

## 2024-08-27 DIAGNOSIS — N3041 Irradiation cystitis with hematuria: Secondary | ICD-10-CM | POA: Diagnosis not present

## 2024-08-28 ENCOUNTER — Encounter (HOSPITAL_BASED_OUTPATIENT_CLINIC_OR_DEPARTMENT_OTHER): Admitting: Internal Medicine

## 2024-08-28 DIAGNOSIS — Z923 Personal history of irradiation: Secondary | ICD-10-CM

## 2024-08-28 DIAGNOSIS — C61 Malignant neoplasm of prostate: Secondary | ICD-10-CM | POA: Diagnosis not present

## 2024-08-28 DIAGNOSIS — N3041 Irradiation cystitis with hematuria: Secondary | ICD-10-CM | POA: Diagnosis not present

## 2024-08-31 ENCOUNTER — Encounter (HOSPITAL_BASED_OUTPATIENT_CLINIC_OR_DEPARTMENT_OTHER): Admitting: Internal Medicine

## 2024-08-31 DIAGNOSIS — C61 Malignant neoplasm of prostate: Secondary | ICD-10-CM

## 2024-08-31 DIAGNOSIS — Z923 Personal history of irradiation: Secondary | ICD-10-CM

## 2024-08-31 DIAGNOSIS — N3041 Irradiation cystitis with hematuria: Secondary | ICD-10-CM | POA: Diagnosis not present

## 2024-09-01 ENCOUNTER — Encounter (HOSPITAL_BASED_OUTPATIENT_CLINIC_OR_DEPARTMENT_OTHER): Admitting: Internal Medicine

## 2024-09-01 DIAGNOSIS — Z923 Personal history of irradiation: Secondary | ICD-10-CM

## 2024-09-01 DIAGNOSIS — N3041 Irradiation cystitis with hematuria: Secondary | ICD-10-CM | POA: Diagnosis not present

## 2024-09-01 DIAGNOSIS — C61 Malignant neoplasm of prostate: Secondary | ICD-10-CM | POA: Diagnosis not present

## 2024-09-02 ENCOUNTER — Encounter (HOSPITAL_BASED_OUTPATIENT_CLINIC_OR_DEPARTMENT_OTHER): Admitting: Internal Medicine

## 2024-09-02 DIAGNOSIS — Z923 Personal history of irradiation: Secondary | ICD-10-CM | POA: Diagnosis not present

## 2024-09-02 DIAGNOSIS — C61 Malignant neoplasm of prostate: Secondary | ICD-10-CM | POA: Diagnosis not present

## 2024-09-02 DIAGNOSIS — N3041 Irradiation cystitis with hematuria: Secondary | ICD-10-CM | POA: Diagnosis not present

## 2024-09-07 ENCOUNTER — Encounter (HOSPITAL_BASED_OUTPATIENT_CLINIC_OR_DEPARTMENT_OTHER): Admitting: Internal Medicine

## 2024-09-07 DIAGNOSIS — N3041 Irradiation cystitis with hematuria: Secondary | ICD-10-CM | POA: Diagnosis not present

## 2024-09-07 DIAGNOSIS — Z923 Personal history of irradiation: Secondary | ICD-10-CM | POA: Insufficient documentation

## 2024-09-07 DIAGNOSIS — C61 Malignant neoplasm of prostate: Secondary | ICD-10-CM | POA: Insufficient documentation

## 2024-09-08 ENCOUNTER — Encounter (HOSPITAL_BASED_OUTPATIENT_CLINIC_OR_DEPARTMENT_OTHER): Admitting: Internal Medicine

## 2024-09-08 DIAGNOSIS — N3041 Irradiation cystitis with hematuria: Secondary | ICD-10-CM | POA: Diagnosis not present

## 2024-09-08 DIAGNOSIS — C61 Malignant neoplasm of prostate: Secondary | ICD-10-CM

## 2024-09-09 ENCOUNTER — Encounter (HOSPITAL_BASED_OUTPATIENT_CLINIC_OR_DEPARTMENT_OTHER): Admitting: Internal Medicine

## 2024-09-09 DIAGNOSIS — C61 Malignant neoplasm of prostate: Secondary | ICD-10-CM | POA: Diagnosis not present

## 2024-09-09 DIAGNOSIS — N3041 Irradiation cystitis with hematuria: Secondary | ICD-10-CM

## 2024-09-10 ENCOUNTER — Encounter (HOSPITAL_BASED_OUTPATIENT_CLINIC_OR_DEPARTMENT_OTHER): Admitting: Internal Medicine

## 2024-09-10 DIAGNOSIS — C61 Malignant neoplasm of prostate: Secondary | ICD-10-CM | POA: Diagnosis not present

## 2024-09-10 DIAGNOSIS — N3041 Irradiation cystitis with hematuria: Secondary | ICD-10-CM | POA: Diagnosis not present

## 2024-09-10 DIAGNOSIS — Z923 Personal history of irradiation: Secondary | ICD-10-CM | POA: Diagnosis not present

## 2024-09-11 ENCOUNTER — Other Ambulatory Visit: Payer: Self-pay | Admitting: Urology

## 2024-09-11 ENCOUNTER — Encounter (HOSPITAL_BASED_OUTPATIENT_CLINIC_OR_DEPARTMENT_OTHER): Admitting: Internal Medicine

## 2024-09-11 DIAGNOSIS — Z923 Personal history of irradiation: Secondary | ICD-10-CM | POA: Diagnosis not present

## 2024-09-11 DIAGNOSIS — N3041 Irradiation cystitis with hematuria: Secondary | ICD-10-CM | POA: Diagnosis not present

## 2024-09-11 DIAGNOSIS — C61 Malignant neoplasm of prostate: Secondary | ICD-10-CM | POA: Diagnosis not present

## 2024-09-14 ENCOUNTER — Encounter (HOSPITAL_BASED_OUTPATIENT_CLINIC_OR_DEPARTMENT_OTHER): Admitting: Internal Medicine

## 2024-09-14 ENCOUNTER — Telehealth: Payer: Self-pay

## 2024-09-14 DIAGNOSIS — N3041 Irradiation cystitis with hematuria: Secondary | ICD-10-CM | POA: Diagnosis not present

## 2024-09-14 DIAGNOSIS — C61 Malignant neoplasm of prostate: Secondary | ICD-10-CM | POA: Diagnosis not present

## 2024-09-14 DIAGNOSIS — Z923 Personal history of irradiation: Secondary | ICD-10-CM | POA: Diagnosis not present

## 2024-09-14 NOTE — Telephone Encounter (Signed)
 Received confirmation of successful fax transmission of surgical clearance.

## 2024-09-15 ENCOUNTER — Encounter (HOSPITAL_BASED_OUTPATIENT_CLINIC_OR_DEPARTMENT_OTHER): Admitting: Internal Medicine

## 2024-09-15 DIAGNOSIS — Z923 Personal history of irradiation: Secondary | ICD-10-CM | POA: Diagnosis not present

## 2024-09-15 DIAGNOSIS — C61 Malignant neoplasm of prostate: Secondary | ICD-10-CM | POA: Diagnosis not present

## 2024-09-15 DIAGNOSIS — N3041 Irradiation cystitis with hematuria: Secondary | ICD-10-CM | POA: Diagnosis not present

## 2024-09-16 ENCOUNTER — Encounter (HOSPITAL_BASED_OUTPATIENT_CLINIC_OR_DEPARTMENT_OTHER): Admitting: Internal Medicine

## 2024-09-16 DIAGNOSIS — N3041 Irradiation cystitis with hematuria: Secondary | ICD-10-CM

## 2024-09-16 DIAGNOSIS — C61 Malignant neoplasm of prostate: Secondary | ICD-10-CM

## 2024-09-16 DIAGNOSIS — Z923 Personal history of irradiation: Secondary | ICD-10-CM

## 2024-09-17 ENCOUNTER — Encounter (HOSPITAL_BASED_OUTPATIENT_CLINIC_OR_DEPARTMENT_OTHER): Admitting: Internal Medicine

## 2024-09-17 DIAGNOSIS — C61 Malignant neoplasm of prostate: Secondary | ICD-10-CM

## 2024-09-17 DIAGNOSIS — N3041 Irradiation cystitis with hematuria: Secondary | ICD-10-CM

## 2024-09-17 DIAGNOSIS — Z923 Personal history of irradiation: Secondary | ICD-10-CM | POA: Diagnosis not present

## 2024-10-06 NOTE — Progress Notes (Signed)
 Attempted to obtain medical history via telephone, unable to reach at this time. HIPAA compliant voicemail message left requesting return call to pre surgical testing department.

## 2024-10-06 NOTE — Anesthesia Preprocedure Evaluation (Signed)
"                                    Anesthesia Evaluation  Patient identified by MRN, date of birth, ID band Patient awake    Reviewed: Allergy & Precautions, NPO status , Patient's Chart, lab work & pertinent test results  History of Anesthesia Complications Negative for: history of anesthetic complications  Airway Mallampati: I  TM Distance: >3 FB Neck ROM: Full    Dental no notable dental hx. (+) Teeth Intact, Dental Advisory Given   Pulmonary sleep apnea (inspire) , former smoker   Pulmonary exam normal breath sounds clear to auscultation       Cardiovascular hypertension, (-) angina (-) Past MI Normal cardiovascular exam Rhythm:Regular Rate:Normal     Neuro/Psych negative neurological ROS  negative psych ROS   GI/Hepatic negative GI ROS, Neg liver ROS,neg GERD  ,,  Endo/Other  negative endocrine ROSneg diabetes    Renal/GU      Musculoskeletal negative musculoskeletal ROS (+)    Abdominal   Peds  Hematology   Anesthesia Other Findings   Reproductive/Obstetrics negative OB ROS                              Anesthesia Physical Anesthesia Plan  ASA: 3  Anesthesia Plan: General   Post-op Pain Management: Tylenol  PO (pre-op)*   Induction: Intravenous  PONV Risk Score and Plan: Treatment may vary due to age or medical condition, Ondansetron  and Midazolam   Airway Management Planned: LMA  Additional Equipment:   Intra-op Plan:   Post-operative Plan: Extubation in OR  Informed Consent: I have reviewed the patients History and Physical, chart, labs and discussed the procedure including the risks, benefits and alternatives for the proposed anesthesia with the patient or authorized representative who has indicated his/her understanding and acceptance.     Dental advisory given  Plan Discussed with: CRNA and Surgeon  Anesthesia Plan Comments:          Anesthesia Quick Evaluation  "

## 2024-10-07 ENCOUNTER — Ambulatory Visit (HOSPITAL_COMMUNITY): Admitting: Anesthesiology

## 2024-10-07 ENCOUNTER — Encounter (HOSPITAL_COMMUNITY): Payer: Self-pay | Admitting: Urology

## 2024-10-07 ENCOUNTER — Ambulatory Visit (HOSPITAL_COMMUNITY)

## 2024-10-07 ENCOUNTER — Encounter (HOSPITAL_COMMUNITY)
Admission: RE | Disposition: A | Discharge status: Home / Self Care | Payer: Self-pay | Source: Home / Self Care | Attending: Urology

## 2024-10-07 DIAGNOSIS — C61 Malignant neoplasm of prostate: Secondary | ICD-10-CM | POA: Diagnosis not present

## 2024-10-07 DIAGNOSIS — Z8042 Family history of malignant neoplasm of prostate: Secondary | ICD-10-CM | POA: Diagnosis not present

## 2024-10-07 DIAGNOSIS — Z7901 Long term (current) use of anticoagulants: Secondary | ICD-10-CM | POA: Diagnosis not present

## 2024-10-07 DIAGNOSIS — N3041 Irradiation cystitis with hematuria: Secondary | ICD-10-CM | POA: Diagnosis not present

## 2024-10-07 DIAGNOSIS — Z9079 Acquired absence of other genital organ(s): Secondary | ICD-10-CM | POA: Insufficient documentation

## 2024-10-07 DIAGNOSIS — I10 Essential (primary) hypertension: Secondary | ICD-10-CM

## 2024-10-07 DIAGNOSIS — N3040 Irradiation cystitis without hematuria: Secondary | ICD-10-CM | POA: Diagnosis not present

## 2024-10-07 DIAGNOSIS — G4733 Obstructive sleep apnea (adult) (pediatric): Secondary | ICD-10-CM | POA: Insufficient documentation

## 2024-10-07 DIAGNOSIS — Z01818 Encounter for other preprocedural examination: Secondary | ICD-10-CM

## 2024-10-07 DIAGNOSIS — Z87891 Personal history of nicotine dependence: Secondary | ICD-10-CM

## 2024-10-07 DIAGNOSIS — Y842 Radiological procedure and radiotherapy as the cause of abnormal reaction of the patient, or of later complication, without mention of misadventure at the time of the procedure: Secondary | ICD-10-CM | POA: Diagnosis not present

## 2024-10-07 DIAGNOSIS — Z8249 Family history of ischemic heart disease and other diseases of the circulatory system: Secondary | ICD-10-CM | POA: Insufficient documentation

## 2024-10-07 DIAGNOSIS — Z86718 Personal history of other venous thrombosis and embolism: Secondary | ICD-10-CM | POA: Diagnosis not present

## 2024-10-07 DIAGNOSIS — G4730 Sleep apnea, unspecified: Secondary | ICD-10-CM

## 2024-10-07 DIAGNOSIS — N529 Male erectile dysfunction, unspecified: Secondary | ICD-10-CM | POA: Insufficient documentation

## 2024-10-07 HISTORY — PX: CYSTOSCOPY WITH FULGERATION: SHX6638

## 2024-10-07 LAB — CBC
HCT: 31.8 % — ABNORMAL LOW (ref 39.0–52.0)
Hemoglobin: 9.6 g/dL — ABNORMAL LOW (ref 13.0–17.0)
MCH: 25.3 pg — ABNORMAL LOW (ref 26.0–34.0)
MCHC: 30.2 g/dL (ref 30.0–36.0)
MCV: 83.9 fL (ref 80.0–100.0)
Platelets: 255 K/uL (ref 150–400)
RBC: 3.79 MIL/uL — ABNORMAL LOW (ref 4.22–5.81)
RDW: 14.2 % (ref 11.5–15.5)
WBC: 4.7 K/uL (ref 4.0–10.5)
nRBC: 0 % (ref 0.0–0.2)

## 2024-10-07 LAB — BASIC METABOLIC PANEL WITH GFR
Anion gap: 13 (ref 5–15)
BUN: 22 mg/dL (ref 8–23)
CO2: 21 mmol/L — ABNORMAL LOW (ref 22–32)
Calcium: 9.9 mg/dL (ref 8.9–10.3)
Chloride: 105 mmol/L (ref 98–111)
Creatinine, Ser: 1.01 mg/dL (ref 0.61–1.24)
GFR, Estimated: >60 mL/min
Glucose, Bld: 105 mg/dL — ABNORMAL HIGH (ref 70–99)
Potassium: 4.2 mmol/L (ref 3.5–5.1)
Sodium: 139 mmol/L (ref 135–145)

## 2024-10-07 SURGERY — CYSTOSCOPY, WITH BLADDER FULGURATION
Anesthesia: General

## 2024-10-07 MED ORDER — KETOROLAC TROMETHAMINE 30 MG/ML IJ SOLN
INTRAMUSCULAR | Status: AC
  Filled 2024-10-07: qty 1

## 2024-10-07 MED ORDER — OXYCODONE HCL 5 MG/5ML PO SOLN: 5.0000 mg | Freq: Once | ORAL | Status: DC | PRN

## 2024-10-07 MED ORDER — MIDAZOLAM HCL (PF) 2 MG/2ML IJ SOLN
INTRAMUSCULAR | Status: DC | PRN
  Administered 2024-10-07: 2 mg via INTRAVENOUS

## 2024-10-07 MED ORDER — LIDOCAINE HCL (PF) 2 % IJ SOLN
INTRAMUSCULAR | Status: DC | PRN
  Administered 2024-10-07: 100 mg via INTRADERMAL

## 2024-10-07 MED ORDER — FENTANYL CITRATE (PF) 50 MCG/ML IJ SOSY
25.0000 ug | PREFILLED_SYRINGE | INTRAMUSCULAR | Status: DC | PRN
  Administered 2024-10-07 (×2): 50 ug via INTRAVENOUS

## 2024-10-07 MED ORDER — GENTAMICIN SULFATE 40 MG/ML IJ SOLN
5.0000 mg/kg | INTRAVENOUS | Status: AC
  Administered 2024-10-07: 456.4 mg via INTRAVENOUS
  Filled 2024-10-07: qty 11.5

## 2024-10-07 MED ORDER — FENTANYL CITRATE (PF) 100 MCG/2ML IJ SOLN
INTRAMUSCULAR | Status: AC
  Filled 2024-10-07: qty 2

## 2024-10-07 MED ORDER — PHENYLEPHRINE HCL (PRESSORS) 10 MG/ML IV SOLN
INTRAVENOUS | Status: DC | PRN
  Administered 2024-10-07: 80 ug via INTRAVENOUS
  Administered 2024-10-07 (×2): 160 ug via INTRAVENOUS

## 2024-10-07 MED ORDER — ONDANSETRON HCL 4 MG/2ML IJ SOLN
4.0000 mg | Freq: Once | INTRAMUSCULAR | Status: AC | PRN
  Administered 2024-10-07: 4 mg via INTRAVENOUS

## 2024-10-07 MED ORDER — HYDROMORPHONE HCL 1 MG/ML IJ SOLN
INTRAMUSCULAR | Status: AC
  Filled 2024-10-07: qty 1

## 2024-10-07 MED ORDER — ACETAMINOPHEN 500 MG PO TABS
1000.0000 mg | ORAL_TABLET | Freq: Once | ORAL | Status: AC
  Administered 2024-10-07: 1000 mg via ORAL
  Filled 2024-10-07: qty 2

## 2024-10-07 MED ORDER — CHLORHEXIDINE GLUCONATE 0.12 % MT SOLN
15.0000 mL | Freq: Once | OROMUCOSAL | Status: AC
  Administered 2024-10-07: 15 mL via OROMUCOSAL

## 2024-10-07 MED ORDER — FENTANYL CITRATE (PF) 100 MCG/2ML IJ SOLN
INTRAMUSCULAR | Status: DC | PRN
  Administered 2024-10-07: 25 ug via INTRAVENOUS

## 2024-10-07 MED ORDER — LIDOCAINE HCL (PF) 2 % IJ SOLN
INTRAMUSCULAR | Status: AC
  Filled 2024-10-07: qty 5

## 2024-10-07 MED ORDER — SENNOSIDES-DOCUSATE SODIUM 8.6-50 MG PO TABS: 1.0000 | ORAL_TABLET | Freq: Two times a day (BID) | ORAL | 0 refills | Status: AC

## 2024-10-07 MED ORDER — HYDROMORPHONE HCL 1 MG/ML IJ SOLN
0.2500 mg | INTRAMUSCULAR | Status: DC | PRN
  Administered 2024-10-07 (×2): 0.5 mg via INTRAVENOUS

## 2024-10-07 MED ORDER — KETOROLAC TROMETHAMINE 30 MG/ML IJ SOLN
30.0000 mg | Freq: Once | INTRAMUSCULAR | Status: AC
  Administered 2024-10-07: 30 mg via INTRAVENOUS

## 2024-10-07 MED ORDER — OXYCODONE HCL 5 MG PO TABS: 5.0000 mg | ORAL_TABLET | Freq: Once | ORAL | Status: DC | PRN

## 2024-10-07 MED ORDER — OXYBUTYNIN CHLORIDE 5 MG PO TABS
ORAL_TABLET | ORAL | Status: AC
  Filled 2024-10-07: qty 1

## 2024-10-07 MED ORDER — FENTANYL CITRATE (PF) 50 MCG/ML IJ SOSY
PREFILLED_SYRINGE | INTRAMUSCULAR | Status: AC
  Filled 2024-10-07: qty 1

## 2024-10-07 MED ORDER — PROPOFOL 10 MG/ML IV BOLUS
INTRAVENOUS | Status: AC
  Filled 2024-10-07: qty 20

## 2024-10-07 MED ORDER — PROPOFOL 10 MG/ML IV BOLUS
INTRAVENOUS | Status: DC | PRN
  Administered 2024-10-07: 200 mg via INTRAVENOUS

## 2024-10-07 MED ORDER — ONDANSETRON HCL 4 MG/2ML IJ SOLN
INTRAMUSCULAR | Status: AC
  Filled 2024-10-07: qty 2

## 2024-10-07 MED ORDER — OXYCODONE-ACETAMINOPHEN 5-325 MG PO TABS: 1.0000 | ORAL_TABLET | Freq: Four times a day (QID) | ORAL | 0 refills | Status: AC | PRN

## 2024-10-07 MED ORDER — ORAL CARE MOUTH RINSE: 15.0000 mL | Freq: Once | OROMUCOSAL | Status: AC

## 2024-10-07 MED ORDER — LACTATED RINGERS IV SOLN: INTRAVENOUS | Status: DC

## 2024-10-07 MED ORDER — PHENAZOPYRIDINE HCL 200 MG PO TABS: 200.0000 mg | ORAL_TABLET | Freq: Three times a day (TID) | ORAL | 5 refills | Status: AC | PRN

## 2024-10-07 MED ORDER — SODIUM CHLORIDE 0.9 % IR SOLN
Status: DC | PRN
  Administered 2024-10-07: 1000 mL

## 2024-10-07 MED ORDER — ACETAMINOPHEN 10 MG/ML IV SOLN
INTRAVENOUS | Status: AC
  Filled 2024-10-07: qty 100

## 2024-10-07 MED ORDER — ACETAMINOPHEN 10 MG/ML IV SOLN: 1000.0000 mg | Freq: Once | INTRAVENOUS | Status: DC | PRN

## 2024-10-07 MED ORDER — SODIUM CHLORIDE 0.9 % IR SOLN
Status: DC | PRN
  Administered 2024-10-07: 3000 mL via INTRAVESICAL

## 2024-10-07 MED ORDER — MIDAZOLAM HCL 2 MG/2ML IJ SOLN
INTRAMUSCULAR | Status: AC
  Filled 2024-10-07: qty 2

## 2024-10-07 MED ORDER — OXYBUTYNIN CHLORIDE 5 MG PO TABS: 5.0000 mg | ORAL_TABLET | Freq: Three times a day (TID) | ORAL | 1 refills | Status: AC | PRN

## 2024-10-07 MED ORDER — OXYBUTYNIN CHLORIDE 5 MG PO TABS
5.0000 mg | ORAL_TABLET | Freq: Once | ORAL | Status: AC
  Administered 2024-10-07: 5 mg via ORAL

## 2024-10-07 SURGICAL SUPPLY — 13 items
BAG URINE DRAIN 2000ML AR STRL (UROLOGICAL SUPPLIES) IMPLANT
BAG URO CATCHER STRL LF (MISCELLANEOUS) ×1 IMPLANT
DRAPE FOOT SWITCH (DRAPES) ×1 IMPLANT
ELECT REM PT RETURN 15FT ADLT (MISCELLANEOUS) ×1 IMPLANT
GLOVE SURG LX STRL 7.5 STRW (GLOVE) ×1 IMPLANT
GOWN STRL REUS W/ TWL XL LVL3 (GOWN DISPOSABLE) ×1 IMPLANT
KIT TURNOVER KIT A (KITS) ×1 IMPLANT
LOOP CUT BIPOLAR 24F LRG (ELECTROSURGICAL) IMPLANT
MANIFOLD NEPTUNE II (INSTRUMENTS) ×1 IMPLANT
PACK CYSTO (CUSTOM PROCEDURE TRAY) ×1 IMPLANT
SYRINGE TOOMEY IRRIG 70ML (MISCELLANEOUS) IMPLANT
TUBING CONNECTING 10 (TUBING) ×1 IMPLANT
TUBING UROLOGY SET (TUBING) ×1 IMPLANT

## 2024-10-07 NOTE — Discharge Instructions (Addendum)
 1 - You may have urinary urgency (bladder spasms) and bloody urine on / off for at least 2 weeks. This is normal.  2 - OK to restart Xarelto on Friday as long as no hematuria with clots  3 - Call MD or go to ER for fever >102, severe pain / nausea / vomiting not relieved by medications, or acute change in medical status

## 2024-10-07 NOTE — Op Note (Unsigned)
 Kevin [name], Kevin [name] MEDICAL RECORD NO: [id number] ACCOUNT NO: [id number] DATE OF BIRTH: 01-22-1956 FACILITY: [facility] LOCATION: [facility] PHYSICIAN: Ricardo Likens, MD  Operative Report   SURGEON:  Ricardo Likens, MD  PREOPERATIVE DIAGNOSIS:  Severe radiation cystitis.  PROCEDURE PERFORMED:  Bladder fulguration, cystoscopy.  ESTIMATED BLOOD LOSS:  20 mL.  COMPLICATIONS:  None.  SPECIMENS:  None.  PREOPERATIVE FINDINGS: 1.  In situ penile prosthesis drastically limits range of motion for cystoscopy. 2.  Non-visualization of ureteral orifices completely obscured from radiation cystitis changes. 3.  Severe radiation cystitis in all quadrants of the bladder. 4.  Complete fulguration of all accessible areas of radiation cystitis estimated approximately 30% of bladder surface area accessible.  INDICATIONS:  The patient is a 68 year old man with an unfortunate course for prostate cancer including a radical prostatectomy, followed by radiation which has done a good job of controlling his prostate cancer; however, has left him with terrible  radiation cystitis with large hematuria that is recurrent.  He is status post many, many sessions of hyperbaric oxygen  but continues to bleed.  He also has a history of a DVT on blood thinners as well as a penile prosthesis with revision, all performed  by another provider.  He is now referred for my care for all of the above.  He continues to bleed significantly with clots, but fortunately, no orthostasis despite 60 treatments of hyperbaric oxygen .  I counseled him towards operative fulguration and  endoscopy, and we did frankly discuss cystectomy as well which he wishes to avoid.  He presents today for bladder fulguration.  Informed consent was obtained, placed in the medical record.  DESCRIPTION OF PROCEDURE:  The patient being Kevin [name] verified and procedure being cystoscopy with possible retrogrades, bladder biopsy and fulguration was confirmed.   Procedure timeout was performed.  Intravenous antibiotics were administered.   General anesthesia was introduced.  The patient was placed into a low lithotomy position.  Sterile field was created, prepping and draping the patient's penis, perineum, and proximal thigh using iodine.  His in situ semirigid prosthesis is somewhat  angulated to the right.  Cystourethroscopy was very carefully performed using a 21-French rigid cystoscope with offset lens.  His in situ prosthesis makes angulation of the cystoscopy very limited as well as reach with cystoscopy very limited.   Inspection of the anterior and posterior urethra was relatively unremarkable.  A wide open urethrovesical anastomosis.  Inspection of the bladder revealed severe radiation cystitis changes essentially in all quadrants of the bladder.  Whereas typically,  it is often more focused on the bladder neck, this is everywhere with beefy red typical varicosity changes with oozing.  The urethra was very carefully calibrated from 21-French to 28-French using male dilators, taking exquisite care to avoid any  perforation given his very challenging anatomy with his prosthesis in situ.  Next, a resectoscope sheath with visual obturator was introduced, 26-French to the level of the bladder.  I was quite happy with the safety of this but again angulation quite  limited as is reach.  Next, using a resectoscope loop with coagulation current, all accessible areas of radiation cystitis changes were fulgurated.  It is estimated that I was only able to fulgerate approximately 30% of the surface area of the bladder, but  fortunately the bladder neck was well visualized and well fulgurated.  I was quite happy with the safety and completeness of this.  The last portion was performed in a descending spiral fashion to avoid bladder neck  trauma and performed using multiple  manual fill empty cycles to access areas of the dome and posterior wall within the restraints of  the limited angulation.  We maximally achieved what I feel is to be safe fulguration.  Bladder was emptied per cystoscope.  Hopefully this will provide him  some quiescence of his gross hematuria.  Procedure was then terminated.  The patient tolerated the procedure well.  No immediate periprocedural complications.  The patient was taken to the postanesthesia care unit in stable condition with plan for  discharge home.   NIK D: 10/07/2024 3:33:57 pm T: 10/07/2024 11:37:00 pm  JOB: [id number]/ [id number]

## 2024-10-07 NOTE — H&P (Signed)
 Kevin [name] is an 68 y.o. male.    Chief Complaint: Pre-OP Cysto, Bilateral Retrogrades / Fulgeration / Possible Bladder Biopsy  HPI:   1 - high Risk Prostate Cancer - Status post RRP by Dr. Thelbert Caprice at [facility] 2017 for grade 5 disease. Pre-o PSA 4.6. Salvage XRT 2018 for PET-negative PSA recurrence.  Summarized post-op course: 2024 - PSA <.01  2 - Erectile Dysfunction - penile prosthesis placed previously, explanted due to bladder resevoir erosion, and semii-rigd plcaed 2022 by [place]. CT 2025 shown only about 3cm tubing present left pre-peritoneal space.  3 - Bladder Stone - s/p cystolithalopexy 2023 for bladder sotne felt to liekly be on bladder foreing body (from prior IPP or prostatectomy clip). CT 2025 no residual bladder stones.  4 - Gross Hematuria / Radiation Cystitis - on / off gross hematurai 2025. CT w/o upper tract lesions, cysto 04/2024 confirms radiation cystitis, mild-moderate. he is on Xarelto for DVT history. s/p hyperbaric O2 x2.  PMH sig for DVT/Xarelto, HTN. 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 Matilda. His PCP is Norleen Sauers with [facility].  Today  Kevin [name] is seen to proceed with OR cysto fulgeration for persistant hematura from refractory radiation cystitis. HE is now s/p 40 hyperbaric treatment, and 20 more and still seeing gross hematuria.Fortunately no large clots / major anemia. Held Xarelto as directed.   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   ctcl cutaneous t cell lymphoma   Prostate cancer (HCC) 2017   Rosacea     Past Surgical History:  Procedure Laterality Date   CHOLECYSTECTOMY  1994 or1995   CYSTOSCOPY W/ RETROGRADES Left 01/20/2017   Procedure: CYSTOSCOPY WITH RETROGRADE  PYELOGRAM, LEFT STENT PLACEMENT(STRING OFF);  Surgeon: Gretel Ferrara, MD;  Location: [facility];  Service: Urology;  Laterality: Left;   CYSTOSCOPY WITH LITHOLAPAXY N/A 03/19/2022   Procedure: CYSTOSCOPY WITH LITHOLAPAXY;  Surgeon: Matilda Senior, MD;  Location: [facility];  Service: Urology;  Laterality: N/A;   CYSTOSCOPY WITH RETROGRADE PYELOGRAM, URETEROSCOPY AND STENT PLACEMENT Left 01/17/2017   Procedure: CYSTOSCOPY WITH RETROGRADE PYELOGRAM, URETEROSCOPY, HOLMIUM LASER AND  LEFT STENT PLACEMENT;  Surgeon: Senior Matilda, MD;  Location: [facility];  Service: Urology;  Laterality: Left;   DRUG INDUCED ENDOSCOPY Bilateral 11/27/2022   Procedure: DRUG INDUCED ENDOSCOPY;  Surgeon: Carlie Clark, MD;  Location: [facility];  Service: ENT;  Laterality: Bilateral;   HOLMIUM LASER APPLICATION N/A 03/19/2022   Procedure: HOLMIUM LASER APPLICATION;  Surgeon: Matilda Senior, MD;  Location[facility];  Service: Urology;  Laterality: N/A;   IMPLANTATION OF HYPOGLOSSAL NERVE STIMULATOR Right 05/15/2023   Procedure: IMPLANTATION OF HYPOGLOSSAL NERVE STIMULATOR;  Surgeon: Carlie Clark, MD;  Location: [facility] OR;  Service: ENT;  Laterality: Right;  RNFA   PENILE PROSTHESIS IMPLANT N/A 06/23/2020   Procedure: PENILE PROTHESIS INFLATABLE;  Surgeon: Matilda Senior, MD;  Location: [facility];  Service: Urology;  Laterality: N/A;  2 HRS   PENILE PROSTHESIS IMPLANT N/A 11/14/2020   Procedure: Excision of prosthesis resevoir, Closure of bladder defect, Suprapubic tube placement;  Surgeon: Matilda Senior, MD;  Location: [facility];  Service: Urology;  Laterality: N/A;  PENILE PROSTHESIS IMPLANT N/A 02/23/2021   Procedure: PENILE PROTHESIS RESERVOIR;  Surgeon: Matilda Senior, MD;  Location: [facility];  Service: Urology;  Laterality: N/A;  75 MINS   PROSTATE SURGERY  2017   radical prostectomy @ [facility]-   receiving  chronic interferon      TONSILLECTOMY  as child   adenoids removed   URETERAL STENT PLACEMENT     wisdom  teeth extraction  1990    Family History  Problem Relation Age of Onset   Stroke Father    Hypertension Father    Head & neck cancer Father    Hypertension Mother    Skin cancer Mother    Prostate cancer Paternal Uncle        prostate ca   Colon cancer Maternal Grandmother    Breast cancer Neg Hx    Social History:  reports that he quit smoking about 10 years ago. His smoking use included cigarettes. He started smoking about 20 years ago. He has a 5 pack-year smoking history. He has never used smokeless tobacco. He reports that he does not currently use alcohol. He reports current drug use. Drug: Marijuana.  Allergies: Allergies[1]  No medications prior to admission.    No results found for this or any previous visit (from the past 48 hours). No results found.  Review of Systems  Constitutional:  Negative for chills and fever.  Genitourinary:  Positive for hematuria.    There were no vitals taken for this visit. Physical Exam Vitals reviewed.  Eyes:     Pupils: Pupils are equal, round, and reactive to light.  Cardiovascular:     Rate and Rhythm: Normal rate.  Pulmonary:     Effort: Pulmonary effort is normal.  Abdominal:     General: Abdomen is flat.  Genitourinary:    Comments: NO CVAT at present Musculoskeletal:        General: Normal range of motion.     Cervical back: Normal range of motion.  Skin:    General: Skin is warm.  Neurological:     General: No focal deficit present.     Mental Status: He is alert.  Psychiatric:        Mood and Affect: Mood normal.      Assessment/Plan  Proceed as planned with OR cysto / retrogrades / fulgeration / BX for refractory radiation cystitis. He is at very real risk of progressing to cystectomy as only means to stop his hematuria. Risks, benefits, alternatives, expected peri-op course discussed and fact that  todays measures are only likely temporizing.   [name]., MD 10/07/2024, 6:58 AM       [1]  Allergies Allergen Reactions   Zestril [Lisinopril] Other (See Comments) and Cough     ED

## 2024-10-07 NOTE — Anesthesia Procedure Notes (Signed)
 Procedure Name: LMA Insertion Date/Time: 10/07/2024 2:57 PM  Performed by: Nada Corean CROME, CRNAPre-anesthesia Checklist: Emergency Drugs available, Patient identified, Suction available, Patient being monitored and Timeout performed Patient Re-evaluated:Patient Re-evaluated prior to induction Oxygen  Delivery Method: Circle system utilized Preoxygenation: Pre-oxygenation with 100% oxygen  Induction Type: IV induction Ventilation: Mask ventilation without difficulty LMA: LMA inserted LMA Size: 4.0 Tube type: Oral Number of attempts: 1 Placement Confirmation: positive ETCO2 Tube secured with: Tape Dental Injury: Teeth and Oropharynx as per pre-operative assessment

## 2024-10-07 NOTE — Anesthesia Postprocedure Evaluation (Signed)
"   Anesthesia Post Note  Patient: Kevin [name]  Procedure(s) Performed: CYSTOSCOPY, WITH BLADDER FULGURATION     Patient location during evaluation: PACU Anesthesia Type: General Level of consciousness: awake and alert Pain management: pain level controlled Vital Signs Assessment: post-procedure vital signs reviewed and stable Respiratory status: spontaneous breathing, nonlabored ventilation, respiratory function stable and patient connected to nasal cannula oxygen  Cardiovascular status: blood pressure returned to baseline and stable Postop Assessment: no apparent nausea or vomiting Anesthetic complications: no   No notable events documented.  Last Vitals:  Vitals:   10/07/24 1715 10/07/24 1730  BP: 127/80 123/76  Pulse: (!) 59 63  Resp: 11 10  Temp:  36.4 C  SpO2: 95% 96%    Last Pain:  Vitals:   10/07/24 1730  TempSrc:   PainSc: 4                  Garnette DELENA Gab      "

## 2024-10-07 NOTE — Transfer of Care (Signed)
 Immediate Anesthesia Transfer of Care Note  Patient: Kevin [name]  Procedure(s) Performed: CYSTOSCOPY, WITH BLADDER FULGURATION  Patient Location: PACU  Anesthesia Type:General  Level of Consciousness: awake, alert , oriented, and patient cooperative  Airway & Oxygen  Therapy: Patient Spontanous Breathing and Patient connected to face mask oxygen   Post-op Assessment: Report given to RN and Post -op Vital signs reviewed and stable  Post vital signs: Reviewed and stable  Last Vitals:  Vitals Value Taken Time  BP 130/90 10/07/24 15:39  Temp    Pulse 74 10/07/24 15:40  Resp 15 10/07/24 15:40  SpO2 100 % 10/07/24 15:40  Vitals shown include unfiled device data.  Last Pain:  Vitals:   10/07/24 1400  TempSrc:   PainSc: 0-No pain      Patients Stated Pain Goal: 4 (10/07/24 1400)  Complications: No notable events documented.

## 2024-10-07 NOTE — Brief Op Note (Signed)
 10/07/2024  3:27 PM  PATIENT:  Kevin [name]  68 y.o. male  PRE-OPERATIVE DIAGNOSIS:  Irradiation cystitis with hematuria  POST-OPERATIVE DIAGNOSIS:  Irradiation cystitis with hematuria  PROCEDURE:  Procedures: CYSTOSCOPY, WITH BLADDER FULGURATION (N/A)  SURGEON:  Surgeons and Role:    * Manny, Ricardo KATHEE Raddle., MD - Primary  PHYSICIAN ASSISTANT:   ASSISTANTS: none   ANESTHESIA:   general  EBL:  20mL   BLOOD ADMINISTERED:none  DRAINS: none   LOCAL MEDICATIONS USED:  NONE  SPECIMEN:  No Specimen  DISPOSITION OF SPECIMEN:  N/A  COUNTS:  YES  TOURNIQUET:  * No tourniquets in log *  DICTATION: .Other Dictation: Dictation Number [id number]  PLAN OF CARE: Discharge to home after PACU  PATIENT DISPOSITION:  PACU - hemodynamically stable.   Delay start of Pharmacological VTE agent (>24hrs) due to surgical blood loss or risk of bleeding: yes

## 2024-10-08 ENCOUNTER — Encounter (HOSPITAL_COMMUNITY): Payer: Self-pay | Admitting: Urology

## 2024-10-14 ENCOUNTER — Encounter (HOSPITAL_COMMUNITY): Payer: Self-pay

## 2024-10-14 ENCOUNTER — Inpatient Hospital Stay (HOSPITAL_COMMUNITY)
Admission: EM | Admit: 2024-10-14 | Discharge: 2024-10-18 | DRG: 699 | Disposition: A | Discharge status: Home / Self Care | Attending: Internal Medicine | Admitting: Internal Medicine

## 2024-10-14 ENCOUNTER — Other Ambulatory Visit: Payer: Self-pay

## 2024-10-14 ENCOUNTER — Emergency Department (HOSPITAL_COMMUNITY)

## 2024-10-14 DIAGNOSIS — Z79899 Other long term (current) drug therapy: Secondary | ICD-10-CM | POA: Diagnosis not present

## 2024-10-14 DIAGNOSIS — N3041 Irradiation cystitis with hematuria: Secondary | ICD-10-CM | POA: Diagnosis present

## 2024-10-14 DIAGNOSIS — I82409 Acute embolism and thrombosis of unspecified deep veins of unspecified lower extremity: Secondary | ICD-10-CM | POA: Insufficient documentation

## 2024-10-14 DIAGNOSIS — D630 Anemia in neoplastic disease: Secondary | ICD-10-CM | POA: Diagnosis present

## 2024-10-14 DIAGNOSIS — C8400 Mycosis fungoides, unspecified site: Secondary | ICD-10-CM | POA: Diagnosis present

## 2024-10-14 DIAGNOSIS — Z9049 Acquired absence of other specified parts of digestive tract: Secondary | ICD-10-CM | POA: Diagnosis not present

## 2024-10-14 DIAGNOSIS — R109 Unspecified abdominal pain: Secondary | ICD-10-CM | POA: Diagnosis present

## 2024-10-14 DIAGNOSIS — Z8042 Family history of malignant neoplasm of prostate: Secondary | ICD-10-CM

## 2024-10-14 DIAGNOSIS — Z87891 Personal history of nicotine dependence: Secondary | ICD-10-CM

## 2024-10-14 DIAGNOSIS — T8389XA Other specified complication of genitourinary prosthetic devices, implants and grafts, initial encounter: Secondary | ICD-10-CM | POA: Diagnosis present

## 2024-10-14 DIAGNOSIS — Z888 Allergy status to other drugs, medicaments and biological substances status: Secondary | ICD-10-CM

## 2024-10-14 DIAGNOSIS — C61 Malignant neoplasm of prostate: Secondary | ICD-10-CM | POA: Diagnosis present

## 2024-10-14 DIAGNOSIS — F32A Depression, unspecified: Secondary | ICD-10-CM | POA: Diagnosis present

## 2024-10-14 DIAGNOSIS — N3289 Other specified disorders of bladder: Secondary | ICD-10-CM | POA: Diagnosis present

## 2024-10-14 DIAGNOSIS — I10 Essential (primary) hypertension: Secondary | ICD-10-CM | POA: Diagnosis present

## 2024-10-14 DIAGNOSIS — G4719 Other hypersomnia: Secondary | ICD-10-CM | POA: Insufficient documentation

## 2024-10-14 DIAGNOSIS — F419 Anxiety disorder, unspecified: Secondary | ICD-10-CM | POA: Diagnosis present

## 2024-10-14 DIAGNOSIS — N1370 Vesicoureteral-reflux, unspecified: Secondary | ICD-10-CM | POA: Diagnosis present

## 2024-10-14 DIAGNOSIS — Z8546 Personal history of malignant neoplasm of prostate: Secondary | ICD-10-CM | POA: Diagnosis not present

## 2024-10-14 DIAGNOSIS — Z823 Family history of stroke: Secondary | ICD-10-CM

## 2024-10-14 DIAGNOSIS — E785 Hyperlipidemia, unspecified: Secondary | ICD-10-CM | POA: Diagnosis present

## 2024-10-14 DIAGNOSIS — R10A0 Flank pain, unspecified side: Secondary | ICD-10-CM

## 2024-10-14 DIAGNOSIS — D62 Acute posthemorrhagic anemia: Secondary | ICD-10-CM | POA: Diagnosis present

## 2024-10-14 DIAGNOSIS — N1330 Unspecified hydronephrosis: Secondary | ICD-10-CM | POA: Diagnosis present

## 2024-10-14 DIAGNOSIS — E039 Hypothyroidism, unspecified: Secondary | ICD-10-CM | POA: Diagnosis present

## 2024-10-14 DIAGNOSIS — K567 Ileus, unspecified: Secondary | ICD-10-CM | POA: Diagnosis not present

## 2024-10-14 DIAGNOSIS — G4733 Obstructive sleep apnea (adult) (pediatric): Secondary | ICD-10-CM | POA: Diagnosis present

## 2024-10-14 DIAGNOSIS — Z79890 Hormone replacement therapy (postmenopausal): Secondary | ICD-10-CM

## 2024-10-14 DIAGNOSIS — I82501 Chronic embolism and thrombosis of unspecified deep veins of right lower extremity: Secondary | ICD-10-CM | POA: Diagnosis present

## 2024-10-14 DIAGNOSIS — T83410A Breakdown (mechanical) of penile (implanted) prosthesis, initial encounter: Secondary | ICD-10-CM | POA: Diagnosis present

## 2024-10-14 DIAGNOSIS — Z7901 Long term (current) use of anticoagulants: Secondary | ICD-10-CM | POA: Diagnosis not present

## 2024-10-14 DIAGNOSIS — Z923 Personal history of irradiation: Secondary | ICD-10-CM | POA: Diagnosis not present

## 2024-10-14 DIAGNOSIS — Z800 Family history of malignant neoplasm of digestive organs: Secondary | ICD-10-CM

## 2024-10-14 DIAGNOSIS — Z8249 Family history of ischemic heart disease and other diseases of the circulatory system: Secondary | ICD-10-CM

## 2024-10-14 DIAGNOSIS — R32 Unspecified urinary incontinence: Principal | ICD-10-CM

## 2024-10-14 DIAGNOSIS — N3000 Acute cystitis without hematuria: Secondary | ICD-10-CM

## 2024-10-14 DIAGNOSIS — E559 Vitamin D deficiency, unspecified: Secondary | ICD-10-CM | POA: Insufficient documentation

## 2024-10-14 DIAGNOSIS — Z808 Family history of malignant neoplasm of other organs or systems: Secondary | ICD-10-CM

## 2024-10-14 LAB — CBC WITH DIFFERENTIAL/PLATELET
Abs Immature Granulocytes: 0.03 K/uL (ref 0.00–0.07)
Basophils Absolute: 0.1 K/uL (ref 0.0–0.1)
Basophils Relative: 1 %
Eosinophils Absolute: 0.2 K/uL (ref 0.0–0.5)
Eosinophils Relative: 4 %
HCT: 28.1 % — ABNORMAL LOW (ref 39.0–52.0)
Hemoglobin: 8.3 g/dL — ABNORMAL LOW (ref 13.0–17.0)
Immature Granulocytes: 1 %
Lymphocytes Relative: 10 %
Lymphs Abs: 0.6 K/uL — ABNORMAL LOW (ref 0.7–4.0)
MCH: 24.3 pg — ABNORMAL LOW (ref 26.0–34.0)
MCHC: 29.5 g/dL — ABNORMAL LOW (ref 30.0–36.0)
MCV: 82.2 fL (ref 80.0–100.0)
Monocytes Absolute: 0.8 K/uL (ref 0.1–1.0)
Monocytes Relative: 13 %
Neutro Abs: 4.5 K/uL (ref 1.7–7.7)
Neutrophils Relative %: 71 %
Platelets: 321 K/uL (ref 150–400)
RBC: 3.42 MIL/uL — ABNORMAL LOW (ref 4.22–5.81)
RDW: 15.3 % (ref 11.5–15.5)
WBC: 6.2 K/uL (ref 4.0–10.5)
nRBC: 0 % (ref 0.0–0.2)

## 2024-10-14 LAB — COMPREHENSIVE METABOLIC PANEL WITH GFR
ALT: 11 U/L (ref 0–44)
AST: 19 U/L (ref 15–41)
Albumin: 3.9 g/dL (ref 3.5–5.0)
Alkaline Phosphatase: 69 U/L (ref 38–126)
Anion gap: 11 (ref 5–15)
BUN: 15 mg/dL (ref 8–23)
CO2: 24 mmol/L (ref 22–32)
Calcium: 9.6 mg/dL (ref 8.9–10.3)
Chloride: 100 mmol/L (ref 98–111)
Creatinine, Ser: 1.05 mg/dL (ref 0.61–1.24)
GFR, Estimated: >60 mL/min
Glucose, Bld: 139 mg/dL — ABNORMAL HIGH (ref 70–99)
Potassium: 4.4 mmol/L (ref 3.5–5.1)
Sodium: 134 mmol/L — ABNORMAL LOW (ref 135–145)
Total Bilirubin: 0.3 mg/dL (ref 0.0–1.2)
Total Protein: 8.1 g/dL (ref 6.5–8.1)

## 2024-10-14 LAB — URINALYSIS, ROUTINE W REFLEX MICROSCOPIC
Bilirubin Urine: NEGATIVE
Glucose, UA: NEGATIVE mg/dL
Ketones, ur: NEGATIVE mg/dL
Nitrite: POSITIVE — AB
Protein, ur: 30 mg/dL — AB
Specific Gravity, Urine: 1.012 (ref 1.005–1.030)
WBC, UA: >50 WBC/hpf (ref 0–5)
pH: 5 (ref 5.0–8.0)

## 2024-10-14 LAB — LIPASE, BLOOD: Lipase: 14 U/L (ref 11–51)

## 2024-10-14 MED ORDER — MORPHINE SULFATE (PF) 4 MG/ML IV SOLN
4.0000 mg | INTRAVENOUS | Status: DC | PRN
  Administered 2024-10-15 (×3): 4 mg via INTRAVENOUS
  Filled 2024-10-14 (×3): qty 1

## 2024-10-14 MED ORDER — ACETAMINOPHEN 650 MG RE SUPP: 650.0000 mg | Freq: Four times a day (QID) | RECTAL | Status: DC | PRN

## 2024-10-14 MED ORDER — IOHEXOL 300 MG/ML  SOLN
100.0000 mL | Freq: Once | INTRAMUSCULAR | Status: AC | PRN
  Administered 2024-10-14: 100 mL via INTRAVENOUS

## 2024-10-14 MED ORDER — ONDANSETRON HCL 4 MG PO TABS: 4.0000 mg | ORAL_TABLET | Freq: Four times a day (QID) | ORAL | Status: DC | PRN

## 2024-10-14 MED ORDER — OXYBUTYNIN CHLORIDE 5 MG PO TABS
5.0000 mg | ORAL_TABLET | Freq: Once | ORAL | Status: AC
  Administered 2024-10-14: 5 mg via ORAL
  Filled 2024-10-14: qty 1

## 2024-10-14 MED ORDER — MORPHINE SULFATE (PF) 4 MG/ML IV SOLN
4.0000 mg | Freq: Once | INTRAVENOUS | Status: AC
  Administered 2024-10-14: 4 mg via INTRAVENOUS
  Filled 2024-10-14: qty 1

## 2024-10-14 MED ORDER — OXYCODONE HCL 5 MG PO TABS: 5.0000 mg | ORAL_TABLET | ORAL | Status: DC | PRN

## 2024-10-14 MED ORDER — OXYCODONE HCL 5 MG PO TABS
5.0000 mg | ORAL_TABLET | Freq: Once | ORAL | Status: AC
  Administered 2024-10-14: 5 mg via ORAL
  Filled 2024-10-14: qty 1

## 2024-10-14 MED ORDER — SODIUM CHLORIDE 0.9 % IV SOLN
2.0000 g | Freq: Once | INTRAVENOUS | Status: AC
  Administered 2024-10-14: 2 g via INTRAVENOUS
  Filled 2024-10-14: qty 20

## 2024-10-14 MED ORDER — ATORVASTATIN CALCIUM 20 MG PO TABS
20.0000 mg | ORAL_TABLET | Freq: Every evening | ORAL | Status: DC
  Administered 2024-10-14 – 2024-10-17 (×4): 20 mg via ORAL
  Filled 2024-10-14 (×2): qty 1
  Filled 2024-10-14: qty 2
  Filled 2024-10-14: qty 1

## 2024-10-14 MED ORDER — LEVOTHYROXINE SODIUM 75 MCG PO TABS
150.0000 ug | ORAL_TABLET | Freq: Every day | ORAL | Status: DC
  Administered 2024-10-15 – 2024-10-18 (×4): 150 ug via ORAL
  Filled 2024-10-14 (×4): qty 2

## 2024-10-14 MED ORDER — ONDANSETRON HCL 4 MG/2ML IJ SOLN
4.0000 mg | Freq: Once | INTRAMUSCULAR | Status: AC
  Administered 2024-10-14: 4 mg via INTRAVENOUS
  Filled 2024-10-14: qty 2

## 2024-10-14 MED ORDER — SODIUM CHLORIDE 0.9 % IV SOLN
2.0000 g | Freq: Three times a day (TID) | INTRAVENOUS | Status: DC
  Administered 2024-10-14 – 2024-10-18 (×12): 2 g via INTRAVENOUS
  Filled 2024-10-14 (×12): qty 12.5

## 2024-10-14 MED ORDER — VITAMIN B-12 1000 MCG PO TABS
5000.0000 ug | ORAL_TABLET | Freq: Every morning | ORAL | Status: DC
  Administered 2024-10-15 – 2024-10-18 (×4): 5000 ug via ORAL
  Filled 2024-10-14 (×4): qty 5

## 2024-10-14 MED ORDER — BEXAROTENE 75 MG PO CAPS: 300.0000 mg/m2 | ORAL_CAPSULE | Freq: Every day | ORAL | Status: DC

## 2024-10-14 MED ORDER — ONDANSETRON HCL 4 MG/2ML IJ SOLN: 4.0000 mg | Freq: Four times a day (QID) | INTRAMUSCULAR | Status: DC | PRN

## 2024-10-14 MED ORDER — HYOSCYAMINE SULFATE 0.125 MG PO TABS
0.1250 mg | ORAL_TABLET | Freq: Once | ORAL | Status: AC
  Administered 2024-10-14: 0.125 mg via ORAL
  Filled 2024-10-14: qty 1

## 2024-10-14 MED ORDER — MORPHINE SULFATE (PF) 2 MG/ML IV SOLN
2.0000 mg | Freq: Once | INTRAVENOUS | Status: AC
  Administered 2024-10-14: 2 mg via INTRAVENOUS
  Filled 2024-10-14: qty 1

## 2024-10-14 MED ORDER — OXYBUTYNIN CHLORIDE 5 MG PO TABS: 5.0000 mg | ORAL_TABLET | Freq: Three times a day (TID) | ORAL | Status: DC | PRN

## 2024-10-14 MED ORDER — ACETAMINOPHEN 325 MG PO TABS
650.0000 mg | ORAL_TABLET | Freq: Four times a day (QID) | ORAL | Status: DC | PRN
  Administered 2024-10-18 (×2): 650 mg via ORAL
  Filled 2024-10-14 (×2): qty 2

## 2024-10-14 MED ORDER — LIDOCAINE HCL URETHRAL/MUCOSAL 2 % EX GEL
1.0000 | Freq: Once | CUTANEOUS | Status: AC
  Administered 2024-10-14: 1 via URETHRAL
  Filled 2024-10-14: qty 11

## 2024-10-14 MED ORDER — PHENAZOPYRIDINE HCL 100 MG PO TABS
100.0000 mg | ORAL_TABLET | Freq: Once | ORAL | Status: AC
  Administered 2024-10-14: 100 mg via ORAL
  Filled 2024-10-14: qty 1

## 2024-10-14 MED ORDER — MAGNESIUM CITRATE PO SOLN
0.5000 | ORAL | Status: DC | PRN
  Administered 2024-10-15 – 2024-10-17 (×3): 0.5 via ORAL
  Filled 2024-10-14 (×3): qty 296

## 2024-10-14 MED ORDER — PANTOPRAZOLE SODIUM 40 MG PO TBEC
40.0000 mg | DELAYED_RELEASE_TABLET | Freq: Two times a day (BID) | ORAL | Status: DC
  Administered 2024-10-14 – 2024-10-18 (×8): 40 mg via ORAL
  Filled 2024-10-14 (×8): qty 1

## 2024-10-14 MED ORDER — CITALOPRAM HYDROBROMIDE 20 MG PO TABS
40.0000 mg | ORAL_TABLET | Freq: Every day | ORAL | Status: DC
  Administered 2024-10-14 – 2024-10-17 (×4): 40 mg via ORAL
  Filled 2024-10-14: qty 2
  Filled 2024-10-14: qty 4
  Filled 2024-10-14 (×2): qty 2

## 2024-10-14 MED ORDER — OXYCODONE HCL 5 MG PO TABS: 5.0000 mg | ORAL_TABLET | Freq: Four times a day (QID) | ORAL | Status: DC | PRN

## 2024-10-14 MED ORDER — SMOG ENEMA
960.0000 mL | RECTAL | Status: AC | PRN
  Administered 2024-10-15: 960 mL via RECTAL
  Filled 2024-10-14: qty 960

## 2024-10-14 NOTE — Consult Note (Signed)
 "  Urology Consult Note   Requesting Attending Physician:  Rogelia Jerilynn RAMAN, MD Service Providing Consult: Urology  Consulting Attending: Dr. Elisabeth   Reason for Consult: Severe bladder spasm, right side flank pain  HPI: Kevin [name] is seen in consultation for reasons noted above at the request of Rogelia Jerilynn RAMAN, MD. Patient is a 69 y.o. male presenting [facility] with complaint of severe hypogastric pain, right side flank pain, and urinary incontinence.  Patient is well-known to our practice and was followed previously by Dr. Matilda, now Dr. Christopher.  PMH significant for high risk prostate cancer-s/p RRP by Dr. Charles of [facility] in 2017.  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  He has undergone hyperbaric oxygen  for 2 courses.  He underwent cystoscopy with fulguration with Dr. Alvaro on 10/07/2024.  On my arrival this morning [name] was resting comfortably in bed.  Pain was well-managed.  He was alert, oriented, in no distress.  He was an excellent historian.  We reviewed his complex urologic case and discussed various treatment options.  All questions were answered to his satisfaction.  ------------------  Assessment:   69 y.o. male with Hx of prostate cancer now with radiation cystitis, urinary incontinence, right side flank pain, and severe constipation    Recommendations: # Bladder spasm # Urinary incontinence # Right sided flank pain # Right hydronephrosis  His bladder spasm has been significant and ongoing.  Based on his bladder thickening, this was suspected to be due at least somewhat to noncompliance.  He has been able to void small amounts with postvoid residuals around 250 to 300 mL.  Additionally he likely had a fixed urethra which has been dilated through the process of clot  evacuation and fulguration.  He reports that he has had constant leakage, though incompletely emptying, since his procedure.  He was offered catheter to keep dry and assess whether completed bladder decompression improved his spasm.  As needed hyoscyamine  was also added to his scheduled oxybutynin .  Right side flank pain is likely multifactorial.  He has a mild amount of hydronephrosis.  Per his OR note last week, ureteral orifices could not be identified at the time.  I do not have a suspicion that these have been further occluded with fulguration, his level of flank pain and hydronephrosis would be significantly worse after a week of obstruction.  There is likely some restriction of UO from bladder thickening.  He is also severely constipated with his ascending colon measuring and upwards of around 8 cm.  This is likely contributing to his pain.  Recommend aggressive bowel regimen  Right side percutaneous nephrostomy tube was discussed but since his renal function is preserved and his comfort is primarily in the area of the bladder, we will not pursue this at this time.  Plan to monitor for couple of hours and discharge home if feeling better.  Foley catheter could improve spasm with bladder decompression or worsen it from the irritation.  If patient finds catheter to worsen his symptoms, will discontinue before discharge.  Case and plan discussed with Dr. Elisabeth and Dr. 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   ctcl cutaneous t cell lymphoma   Prostate cancer (HCC) 2017   Rosacea     Past Surgical History:  Past Surgical History:  Procedure Laterality Date   CHOLECYSTECTOMY  1994 or1995   CYSTOSCOPY W/ RETROGRADES Left 01/20/2017   Procedure: CYSTOSCOPY  WITH RETROGRADE PYELOGRAM, LEFT STENT PLACEMENT(STRING OFF);  Surgeon: Gretel Ferrara, MD;  Location: [facility];  Service: Urology;  Laterality: Left;   CYSTOSCOPY WITH FULGERATION N/A 10/07/2024   Procedure: CYSTOSCOPY, WITH BLADDER FULGURATION;  Surgeon: Alvaro Ricardo KATHEE Mickey., MD;  Location: [facility];  Service: Urology;  Laterality: N/A;   CYSTOSCOPY WITH LITHOLAPAXY N/A 03/19/2022   Procedure: CYSTOSCOPY WITH LITHOLAPAXY;  Surgeon: Matilda Senior, MD;  Location: [facility];  Service: Urology;  Laterality: N/A;   CYSTOSCOPY WITH RETROGRADE PYELOGRAM, URETEROSCOPY AND STENT PLACEMENT Left 01/17/2017   Procedure: CYSTOSCOPY WITH RETROGRADE PYELOGRAM, URETEROSCOPY, HOLMIUM LASER AND  LEFT STENT PLACEMENT;  Surgeon: Senior Matilda, MD;  Location: [facility];  Service: Urology;  Laterality: Left;   DRUG INDUCED ENDOSCOPY Bilateral 11/27/2022   Procedure: DRUG INDUCED ENDOSCOPY;  Surgeon: Carlie Clark, MD;  Location: [facility];  Service: ENT;  Laterality: Bilateral;   HOLMIUM LASER APPLICATION N/A 03/19/2022   Procedure: HOLMIUM LASER APPLICATION;  Surgeon: Matilda Senior, MD;  Location: [facility];  Service: Urology;  Laterality: N/A;   IMPLANTATION OF HYPOGLOSSAL NERVE STIMULATOR Right 05/15/2023   Procedure: IMPLANTATION OF HYPOGLOSSAL NERVE STIMULATOR;  Surgeon: Carlie Clark, MD;  Location: [facility];  Service: ENT;  Laterality: Right;  RNFA   PENILE PROSTHESIS IMPLANT N/A 06/23/2020   Procedure: PENILE PROTHESIS INFLATABLE;  Surgeon: Matilda Senior, MD;  Location: [facility];  Service: Urology;  Laterality: N/A;  2 HRS   PENILE PROSTHESIS IMPLANT N/A 11/14/2020   Procedure: Excision of prosthesis resevoir, Closure of bladder defect, Suprapubic tube placement;  Surgeon: Matilda Senior, MD;  Location: [facility];  Service: Urology;  Laterality: N/A;   PENILE PROSTHESIS IMPLANT N/A 02/23/2021   Procedure: PENILE  PROTHESIS RESERVOIR;  Surgeon: Matilda Senior, MD;  Location: [facility];  Service: Urology;  Laterality: N/A;  75 MINS   PROSTATE SURGERY  2017   radical prostectomy @ [facility]-   receiving chronic interferon      TONSILLECTOMY  as child   adenoids removed   URETERAL STENT PLACEMENT     wisdom  teeth extraction  1990    Medication: No current facility-administered medications for this encounter.   Current Outpatient Medications  Medication Sig Dispense Refill   amLODipine -valsartan  (EXFORGE ) 5-160 MG tablet Take 1 tablet by mouth in the morning.     atorvastatin  (LIPITOR) 20 MG tablet Take 20 mg by mouth 2 (two) times daily.     bexarotene  (TARGRETIN ) 75 MG CAPS capsule Take 300 mg/m2 by mouth daily with breakfast. Give with food. Protect from light. CAUTION: Chemotherapy/Biotherapy     citalopram  (CELEXA ) 40 MG tablet Take 40 mg by mouth at bedtime.     Cyanocobalamin  5000 MCG CAPS Take 5,000 mcg by mouth every evening.     levothyroxine  (SYNTHROID ) 50 MCG tablet Take 50 mcg by mouth daily before breakfast.     MILK THISTLE PO Take 2,000 mg by mouth in the morning and at bedtime.     oxybutynin  (DITROPAN ) 5 MG tablet Take 1 tablet (5 mg total) by mouth every 8 (eight) hours  as needed for bladder spasms. 30 tablet 1   oxyCODONE -acetaminophen  (PERCOCET) 5-325 MG tablet Take 1 tablet by mouth every 6 (six) hours as needed for moderate pain (pain score 4-6) or severe pain (pain score 7-10) (post-operatively). 15 tablet 0   pantoprazole  (PROTONIX ) 40 MG tablet Take 40 mg by mouth 2 (two) times daily.     phenazopyridine  (PYRIDIUM ) 200 MG tablet Take 1 tablet (200 mg total) by mouth 3 (three) times daily as needed (Dysuria). 20 tablet 5   Psyllium (METAMUCIL PO) Take 1 Dose by mouth daily.     Red Yeast Rice 600 MG CAPS Take 600 mg by mouth 2 (two) times daily.     rivaroxaban (XARELTO) 20 MG TABS tablet Take 20 mg by mouth every evening.     senna-docusate (SENOKOT-S) 8.6-50  MG tablet Take 1 tablet by mouth 2 (two) times daily. While taking strong pain meds to prevent constipation. 10 tablet 0   triamcinolone  cream (KENALOG ) 0.1 % Apply 1 Application topically every other day.     VALCHLOR 0.016 % GEL Apply topically.      Allergies: Allergies[1]  Social History: Social History[2]  Family History Family History  Problem Relation Age of Onset   Stroke Father    Hypertension Father    Head & neck cancer Father    Hypertension Mother    Skin cancer Mother    Prostate cancer Paternal Uncle        prostate ca   Colon cancer Maternal Grandmother    Breast cancer Neg Hx     Review of Systems  Genitourinary:  Positive for dysuria, flank pain, frequency and urgency. Negative for hematuria.     Objective   Vital signs in last 24 hours: BP 122/88 (BP Location: Right Arm)   Pulse 87   Temp 98.4 F (36.9 C) (Oral)   Resp 16   SpO2 97%   Physical Exam General: A&O, resting, appropriate HEENT: [place]/AT Pulmonary: Normal work of breathing Cardiovascular: no cyanosis GU: Semirigid penile prosthesis in place.  Otherwise unremarkable   Most Recent Labs: Lab Results  Component Value Date   WBC 6.2 10/14/2024   HGB 8.3 (L) 10/14/2024   HCT 28.1 (L) 10/14/2024   PLT 321 10/14/2024    Lab Results  Component Value Date   NA 134 (L) 10/14/2024   K 4.4 10/14/2024   CL 100 10/14/2024   CO2 24 10/14/2024   BUN 15 10/14/2024   CREATININE 1.05 10/14/2024   CALCIUM  9.6 10/14/2024    No results found for: INR, APTT   Urine Culture: @[id number] (laburin,org,[id number],[id number])@   IMAGING: CT ABDOMEN PELVIS W CONTRAST Result Date: 10/14/2024 CLINICAL DATA:  Abdominal pain. Recent cystoscopy 10/07/2024. History of prostate cancer and cutaneous T-cell lymphoma. EXAM: CT ABDOMEN AND PELVIS WITH CONTRAST TECHNIQUE: Multidetector CT imaging of the abdomen and pelvis was performed using the standard protocol following bolus administration of intravenous  contrast. RADIATION DOSE REDUCTION: This exam was performed according to the departmental dose-optimization program which includes automated exposure control, adjustment of the mA and/or kV according to patient size and/or use of iterative reconstruction technique. CONTRAST:  OMNIPAQUE  IOHEXOL  300 MG/ML  SOLN COMPARISON:  Abdomen and pelvis CT 03/26/2024 FINDINGS: Lower chest: No acute findings Hepatobiliary: No suspicious focal abnormality within the liver parenchyma. Gallbladder is surgically absent. No intrahepatic or extrahepatic biliary dilation. Pancreas: No focal mass lesion. No dilatation of the main duct. No intraparenchymal cyst. No peripancreatic edema. Spleen: No splenomegaly. No suspicious focal mass  lesion. Adrenals/Urinary Tract: No adrenal nodule or mass. 2 mm nonobstructing stone identified interpolar right kidney. Mild right hydroureteronephrosis evident with ureteral distension down to the level of the UVJ. Left kidney and ureter unremarkable. Circumferential irregular and ill-defined bladder wall thickening noted. A small focal calcification along the inferior bladder wall may be intramural or mucosal based on sagittal image 117/10. Stomach/Bowel: Tiny hiatal hernia. Stomach otherwise unremarkable. Duodenum is normally positioned as is the ligament of Treitz. 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 No small bowel wall thickening. No small bowel dilatation. No gross colonic mass. No colonic wall thickening. The appendix is normal. No gross colonic mass. No colonic wall thickening. Vascular/Lymphatic: There is mild atherosclerotic calcification of the abdominal aorta without aneurysm. There is no gastrohepatic or hepatoduodenal ligament lymphadenopathy. No retroperitoneal or mesenteric lymphadenopathy. No pelvic sidewall lymphadenopathy. Reproductive: Prostatectomy. Other: No substantial intraperitoneal free fluid. Musculoskeletal: No worrisome lytic or sclerotic osseous abnormality. T12  compression deformity is similar in the interval. IMPRESSION: 1. Circumferential irregular and ill-defined bladder wall thickening suggest infection/inflammation. 2. Mild right hydroureteronephrosis, new in the interval since 03/26/2024. Ureteral distension extends down to the level of the right UVJ. No discrete etiology for the urinary distension is evident by CT imaging. 3. 2 mm nonobstructing stone interpolar right kidney. 4. Tiny hiatal hernia. 5.  Aortic Atherosclerosis (ICD10-I70.0). Electronically Signed   By: Camellia Candle M.D.   On: 10/14/2024 06:22    ------  Ole Bourdon, NP Pager: [phone]   Please contact the urology consult pager with any further questions/concerns.     [1]  Allergies Allergen Reactions   Zestril [Lisinopril] Other (See Comments) and Cough     ED  [2]  Social History Tobacco Use   Smoking status: Former    Current packs/day: 0.00    Average packs/day: 0.5 packs/day for 10.0 years (5.0 ttl pk-yrs)    Types: Cigarettes    Start date: 10/08/2004    Quit date: 10/08/2014    Years since quitting: 10.0   Smokeless tobacco: Never  Vaping Use   Vaping status: Never Used  Substance Use Topics   Alcohol use: Not Currently    Comment: 3-4  per day, liquor   Drug use: Yes    Types: Marijuana    Comment: last marijuana use  edible cbd 05/13/2023   "

## 2024-10-14 NOTE — H&P (Signed)
 " History and Physical    Patient: Kevin [name] FMW:[id number] DOB: [birth year] DOA: 10/14/2024 DOS: the patient was seen and examined on 10/14/2024 PCP: Charlott Dorn LABOR, MD  Patient coming from: Home  Chief Complaint:  Chief Complaint  Patient presents with   Abdominal Pain   HPI: Kevin [name] is a 69 y.o. male with medical history significant of colorblindness, vitamin D deficiency, hyperlipidemia, hypertension, mycosis fungoides cutaneous T-cell lymphoma, chronic right lower extremity DVT, urolithiasis, left hydronephrosis, prostate cancer who underwent cystoscopy with fulguration with Dr. Alvaro on 10/07/2024 who presented to the emergency department with complaints of lower abdominal pain since procedure associated with incontinence and scrotal pain when urinating. He has felt subjective fevers and chills, has decreased appetite, no rhinorrhea, sore throat, wheezing or hemoptysis.  No chest pain, palpitations, diaphoresis, PND, orthopnea or pitting edema of the lower extremities.  No he has been constipated..  No gross hematuria.  No polyuria, polydipsia, polyphagia or blurred vision.   Labwork: Urinalysis showed large hemoglobin, positive nitrites, moderate leukocyte esterase, 21-50 RBC, greater than 50 WBC, rare bacteria and WBC clumps.  CBC showed a white count of 6.2 with 71% neutrophils, hemoglobin 8.3 g/dL and platelets 678.  Lipase level was normal.  CMP showed a sodium 134 mmol/L and a glucose of 139 mg/dL, the rest of the CMP measurements were normal.  Imaging: CT abdomen/pelvis with contrast show a circumferential irregular and ill-defined bladder wall thickening suggesting infection/inflammation.  Mild right hydroureteronephrosis that is new since June last year.  The ureteral distention extends down to the level of the right UVJ.  No discrete etiology for the urinary distention is evident by CT imaging.  There is a 2 mm nonobstructing ureterolith interpolar right kidney.  Small hiatal  hernia.  Aortic atherosclerosis.  ED course: Initial vital signs were temperature 99.1 F, pulse 97, respiration 20, BP 116/74 mmHg and O2 sat 96% on room air.  The patient received ceftriaxone  2 g IVPB, antispasmodic, morphine  2 mg IVP, morphine  4 mg IVP x 2, ondansetron  4 mg IVP, oxybutynin  5 mg IVP and oxycodone  5 mg p.o. x 2.   Review of Systems: As mentioned in the history of present illness. All other systems reviewed and are negative. 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   ctcl cutaneous t cell lymphoma   Prostate cancer (HCC) 2017   Rosacea    Past Surgical History:  Procedure Laterality Date   CHOLECYSTECTOMY  1994 or1995   CYSTOSCOPY W/ RETROGRADES Left 01/20/2017   Procedure: CYSTOSCOPY WITH RETROGRADE PYELOGRAM, LEFT STENT PLACEMENT(STRING OFF);  Surgeon: Gretel Ferrara, MD;  [facility]: [facility];  Service: Urology;  Laterality: Left;   CYSTOSCOPY WITH FULGERATION N/A 10/07/2024   Procedure: CYSTOSCOPY, WITH BLADDER FULGURATION;  Surgeon: Alvaro Ricardo KATHEE Mickey., MD;  Location[facility];  Service: Urology;  Laterality: N/A;   CYSTOSCOPY WITH LITHOLAPAXY N/A 03/19/2022   Procedure: CYSTOSCOPY WITH LITHOLAPAXY;  Surgeon: Matilda Senior, MD;  Location[facility];  Service: Urology;  Laterality: N/A;   CYSTOSCOPY WITH RETROGRADE PYELOGRAM, URETEROSCOPY AND STENT PLACEMENT Left 01/17/2017   Procedure: CYSTOSCOPY WITH RETROGRADE PYELOGRAM, URETEROSCOPY, HOLMIUM LASER AND  LEFT STENT PLACEMENT;  Surgeon: Senior Matilda, MD;  Location: [facility];  Service: Urology;  Laterality: Left;   DRUG INDUCED ENDOSCOPY Bilateral 11/27/2022   Procedure: DRUG INDUCED ENDOSCOPY;  Surgeon: Carlie Clark, MD;  Location: [facility];  Service: ENT;   Laterality: Bilateral;   HOLMIUM LASER APPLICATION N/A 03/19/2022   Procedure: HOLMIUM LASER APPLICATION;  Surgeon: Matilda Senior, MD;  Location: [facility];  Service: Urology;  Laterality: N/A;   IMPLANTATION OF HYPOGLOSSAL NERVE STIMULATOR Right 05/15/2023   Procedure: IMPLANTATION OF HYPOGLOSSAL NERVE STIMULATOR;  Surgeon: Carlie Clark, MD;  Location: [facility];  Service: ENT;  Laterality: Right;  RNFA   PENILE PROSTHESIS IMPLANT N/A 06/23/2020   Procedure: PENILE PROTHESIS INFLATABLE;  Surgeon: Matilda Senior, MD;  Location: [facility];  Service: Urology;  Laterality: N/A;  2 HRS   PENILE PROSTHESIS IMPLANT N/A 11/14/2020   Procedure: Excision of prosthesis resevoir, Closure of bladder defect, Suprapubic tube placement;  Surgeon: Matilda Senior, MD;  Location[facility];  Service: Urology;  Laterality: N/A;   PENILE PROSTHESIS IMPLANT N/A 02/23/2021   Procedure: PENILE PROTHESIS RESERVOIR;  Surgeon: Matilda Senior, MD;  Location: [facility];  Service: Urology;  Laterality: N/A;  75 MINS   PROSTATE SURGERY  2017   radical prostectomy @ [facility]-   receiving chronic interferon      TONSILLECTOMY  as child   adenoids removed   URETERAL STENT PLACEMENT     wisdom  teeth extraction  1990   Social History:  reports that he quit smoking about 10 years ago. His smoking use included cigarettes. He started smoking about 20 years ago. He has a 5 pack-year smoking history. He has never used smokeless tobacco. He reports that he does not currently use alcohol. He reports current drug use. Drug: Marijuana.  Allergies[1]  Family History  Problem Relation Age of Onset   Stroke Father    Hypertension Father    Head & neck cancer Father    Hypertension Mother    Skin cancer Mother    Prostate cancer Paternal Uncle        prostate ca   Colon cancer Maternal Grandmother    Breast cancer Neg Hx     Prior to Admission medications  Medication Sig  Start Date End Date Taking? Authorizing Provider  amLODipine -valsartan  (EXFORGE ) 5-160 MG tablet Take 1 tablet by mouth in the morning. 08/04/20   [provider]  atorvastatin  (LIPITOR) 20 MG tablet Take 20 mg by mouth 2 (two) times daily.    [provider]  bexarotene  (TARGRETIN ) 75 MG CAPS capsule Take 300 mg/m2 by mouth daily with breakfast. Give with food. Protect from light. CAUTION: Chemotherapy/Biotherapy    [provider]  citalopram  (CELEXA ) 40 MG tablet Take 40 mg by mouth at bedtime.    [provider]  Cyanocobalamin  5000 MCG CAPS Take 5,000 mcg by mouth every evening.    [provider]  levothyroxine  (SYNTHROID ) 50 MCG tablet Take 50 mcg by mouth daily before breakfast.    [provider]  MILK THISTLE PO Take 2,000 mg by mouth in the morning and at bedtime.    [provider]  oxybutynin  (DITROPAN ) 5 MG tablet Take 1 tablet (5 mg total) by mouth every 8 (eight) hours as needed for bladder spasms. 10/07/24   Devere Lonni Righter, MD  oxyCODONE -acetaminophen  (PERCOCET) 5-325 MG tablet Take 1 tablet by mouth every 6 (six) hours as needed for moderate pain (pain score 4-6) or severe pain (pain score 7-10) (post-operatively). 10/07/24 10/07/25  Alvaro Ricardo KATHEE Mickey., MD  pantoprazole  (PROTONIX ) 40 MG tablet Take 40 mg by mouth 2 (two) times daily.    [provider]  phenazopyridine  (PYRIDIUM ) 200 MG tablet Take 1 tablet (200 mg total) by mouth 3 (three) times daily as needed (Dysuria). 10/07/24 10/07/25  Alvaro Ricardo KATHEE Mickey., MD  Psyllium (METAMUCIL PO) Take 1 Dose by mouth daily.    [provider]  Red Yeast Rice 600 MG CAPS Take 600 mg by mouth 2 (two) times daily.    [provider]  rivaroxaban (XARELTO) 20 MG TABS tablet Take 20 mg by mouth every evening.    [provider]  senna-docusate (SENOKOT-S) 8.6-50 MG tablet Take 1 tablet by mouth 2 (two) times daily. While taking  strong pain meds to prevent constipation. 10/07/24   Alvaro Ricardo KATHEE Mickey., MD  triamcinolone  cream (KENALOG ) 0.1 % Apply 1 Application topically every other day. 03/18/17   [provider]  VALCHLOR 0.016 % GEL Apply topically.    [provider]    Physical Exam: Vitals:   10/14/24 0339 10/14/24 0650 10/14/24 1119 10/14/24 1429  BP: 116/74 116/80 (!) 120/90 122/88  Pulse: 97 76 81 87  Resp: 20 18 18 16   Temp: 99.1 F (37.3 C) 98.7 F (37.1 C) 98.3 F (36.8 C) 98.4 F (36.9 C)  TempSrc: Oral Oral Oral Oral  SpO2: 96% 97% 98% 97%   Physical Exam Vitals and nursing note reviewed.  Constitutional:      General: He is awake. He is not in acute distress.    Appearance: He is ill-appearing.  HENT:     Head: Normocephalic.     Nose: No rhinorrhea.     Mouth/Throat:     Mouth: Mucous membranes are dry.  Eyes:     General: No scleral icterus.    Pupils: Pupils are equal, round, and reactive to light.  Neck:     Vascular: No JVD.  Cardiovascular:     Rate and Rhythm: Normal rate and regular rhythm.     Heart sounds: S1 normal and S2 normal.  Pulmonary:     Effort: Pulmonary effort is normal.     Breath sounds: Normal breath sounds. No wheezing, rhonchi or rales.  Abdominal:     General: Bowel sounds are normal.     Palpations: Abdomen is soft.     Tenderness: There is abdominal tenderness in the right upper quadrant. There is right CVA tenderness and left CVA tenderness. There is no guarding or rebound.  Musculoskeletal:     Cervical back: Neck supple.     Right lower leg: No edema.     Left lower leg: No edema.  Skin:    General: Skin is warm and dry.  Neurological:     General: No focal deficit present.     Mental Status: He is alert and oriented to person, place, and time.  Psychiatric:        Mood and Affect: Mood normal.        Behavior: Behavior normal. Behavior is cooperative.    Data Reviewed:  Results are pending, will review when  available.  Assessment and Plan: Principal Problem:   Hydronephrosis of right kidney Observation/MedSurg. Continue IV fluids. Keep n.p.o. after midnight. Analgesics as needed. Antiemetics as needed. Follow CBC, CMP in AM. Urology consult appreciated.  Active Problems:   Acute blood loss anemia (ABLA) Monitor monitoring hemoglobin. Transfuse as needed.    Malignant neoplasm of prostate (HCC) Continue bexarotene   300 mg p.o. daily. Following with urology.    Mechanical breakdown of implanted penile prosthesis Defer management to urology    OSA (obstructive sleep apnea) CPAP at bedtime.    Essential hypertension Anemic. Hold amlodipine  and ARB.    Hyperlipidemia Continue atorvastatin  20 mg p.o. daily.    Hypothyroidism  Continue levothyroxine  50 mcg p.o. daily.    Advance Care Planning:   Code Status: Full Code   Consults: Urology Gaither Shank, MD)  Family Communication:   Severity of Illness: The appropriate patient status for this patient is INPATIENT. Inpatient status is judged to be reasonable and necessary in order to provide the required intensity of service to ensure the patient's safety. The patient's presenting symptoms, physical exam findings, and initial radiographic and laboratory data in the context of their chronic comorbidities is felt to place them at high risk for further clinical deterioration. Furthermore, it is not anticipated that the patient will be medically stable for discharge from the hospital within 2 midnights of admission.   * I certify that at the point of admission it is my clinical judgment that the patient will require inpatient hospital care spanning beyond 2 midnights from the point of admission due to high intensity of service, high risk for further deterioration and high frequency of surveillance required.*  Author: Alm Dorn Castor, MD 10/14/2024 4:50 PM  For on call review [email or web address].   This document was prepared using  Dragon voice recognition software and may contain some unintended transcription errors.      [1]  Allergies Allergen Reactions   Zestril [Lisinopril] Other (See Comments) and Cough     ED   "

## 2024-10-14 NOTE — ED Triage Notes (Signed)
 Pt reports with lower abdominal pain/groin pain x 1 week after his procedure. Pt also states that he has been incontinent x 1 week and recently started abts for a UTI.

## 2024-10-14 NOTE — ED Provider Notes (Signed)
 " [facility] Emergency Department Provider Note MRN:  [id number]  Arrival date & time: 10/14/2024     Chief Complaint   Abdominal Pain   History of Present Illness   Kevin [name] is a 69 y.o. year-old male with a history of prostate cancer presenting to the ED with chief complaint of abdominal pain.  Patient explains that he is more recently had a recurrence of his prostate cancer.  Underwent radiation treatment.  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  This was done on December 31.  Having continued urinary incontinence since this procedure, having worsening abdominal pain over the past few days.  Pain in the testes in the scrotum especially when urinating.  Review of Systems  A thorough review of systems was obtained and all systems are negative except as noted in the HPI and PMH.   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   ctcl cutaneous t cell lymphoma   Prostate cancer (HCC) 2017   Rosacea     Past Surgical History:  Procedure Laterality Date   CHOLECYSTECTOMY  1994 or1995   CYSTOSCOPY W/ RETROGRADES Left 01/20/2017   Procedure: CYSTOSCOPY WITH RETROGRADE PYELOGRAM, LEFT STENT PLACEMENT(STRING OFF);  Surgeon: Gretel Ferrara, MD;  [facility];  Service: Urology;  Laterality: Left;   CYSTOSCOPY WITH FULGERATION N/A 10/07/2024   Procedure: CYSTOSCOPY, WITH BLADDER FULGURATION;  Surgeon: Alvaro Ricardo KATHEE Mickey., MD;  Location[facility];  Service: Urology;  Laterality: N/A;   CYSTOSCOPY WITH LITHOLAPAXY N/A 03/19/2022   Procedure: CYSTOSCOPY WITH LITHOLAPAXY;  Surgeon: Matilda Senior, MD;  Location: [facility];  Service: Urology;   Laterality: N/A;   CYSTOSCOPY WITH RETROGRADE PYELOGRAM, URETEROSCOPY AND STENT PLACEMENT Left 01/17/2017   Procedure: CYSTOSCOPY WITH RETROGRADE PYELOGRAM, URETEROSCOPY, HOLMIUM LASER AND  LEFT STENT PLACEMENT;  Surgeon: Senior Matilda, MD;  Location: [facility];  Service: Urology;  Laterality: Left;   DRUG INDUCED ENDOSCOPY Bilateral 11/27/2022   Procedure: DRUG INDUCED ENDOSCOPY;  Surgeon: Carlie Clark, MD;  Location: [facility];  Service: ENT;  Laterality: Bilateral;   HOLMIUM LASER APPLICATION N/A 03/19/2022   Procedure: HOLMIUM LASER APPLICATION;  Surgeon: Matilda Senior, MD;  Location: [facility];  Service: Urology;  Laterality: N/A;   IMPLANTATION OF HYPOGLOSSAL NERVE STIMULATOR Right 05/15/2023   Procedure: IMPLANTATION OF HYPOGLOSSAL NERVE STIMULATOR;  Surgeon: Carlie Clark, MD;  Location: [facility];  Service: ENT;  Laterality: Right;  RNFA   PENILE PROSTHESIS IMPLANT N/A 06/23/2020   Procedure: PENILE PROTHESIS INFLATABLE;  Surgeon: Matilda Senior, MD;  Location: [facility];  Service: Urology;  Laterality: N/A;  2 HRS   PENILE PROSTHESIS IMPLANT N/A 11/14/2020   Procedure: Excision of prosthesis resevoir, Closure of bladder defect, Suprapubic tube placement;  Surgeon: Matilda Senior, MD;  [facility];  Service: Urology;  Laterality: N/A;   PENILE PROSTHESIS IMPLANT N/A 02/23/2021   Procedure: PENILE PROTHESIS RESERVOIR;  Surgeon: Matilda Senior, MD;  Location: [facility];  Service: Urology;  Laterality: N/A;  75 MINS   PROSTATE SURGERY  2017   radical prostectomy @ [facility]-   receiving chronic  interferon      TONSILLECTOMY  as child   adenoids removed   URETERAL STENT PLACEMENT     wisdom  teeth extraction  1990    Family History  Problem Relation Age of Onset   Stroke Father    Hypertension Father    Head & neck cancer Father    Hypertension Mother    Skin cancer Mother    Prostate  cancer Paternal Uncle        prostate ca   Colon cancer Maternal Grandmother    Breast cancer Neg Hx     Social History   Socioeconomic History   Marital status: Married    Spouse name: Not on file   Number of children: Not on file   Years of education: Not on file   Highest education level: Not on file  Occupational History   Not on file  Tobacco Use   Smoking status: Former    Current packs/day: 0.00    Average packs/day: 0.5 packs/day for 10.0 years (5.0 ttl pk-yrs)    Types: Cigarettes    Start date: 10/08/2004    Quit date: 10/08/2014    Years since quitting: 10.0   Smokeless tobacco: Never  Vaping Use   Vaping status: Never Used  Substance and Sexual Activity   Alcohol use: Not Currently    Comment: 3-4  per day, liquor   Drug use: Yes    Types: Marijuana    Comment: last marijuana use  edible cbd 05/13/2023   Sexual activity: Yes  Other Topics Concern   Not on file  Social History Narrative   Patient is a medical supplies salesman. He resides with his wife in [place]. They have two children; one resides in [place] the other in [place].   05-29-18   Unable to ask abuse questions wife with him today.   Social Drivers of Health   Tobacco Use: Medium Risk (10/14/2024)   Patient History    Smoking Tobacco Use: Former    Smokeless Tobacco Use: Never    Passive Exposure: Not on Actuary Strain: Not on file  Food Insecurity: Not on file  Transportation Needs: Not on file  Physical Activity: Not on file  Stress: Not on file  Social Connections: Not on file  Intimate Partner Violence: Not on file  Depression (EYV7-0): Not on file  Alcohol Screen: Not on file  Housing: Unknown (01/20/2024)   Received from [facility]   Epic    Unable to Pay for Housing in the Last Year: Not on file    Number of Times Moved in the Last Year: Not on file    At any time in the past 12 months, were you homeless or living in a shelter (including now)?: No   Utilities: Not on file  Health Literacy: Not on file     Physical Exam   Vitals:   10/14/24 0339 10/14/24 0650  BP: 116/74 116/80  Pulse: 97 76  Resp: 20 18  Temp: 99.1 F (37.3 C) 98.7 F (37.1 C)  SpO2: 96% 97%    CONSTITUTIONAL: Well-appearing, NAD NEURO/PSYCH:  Alert and oriented x 3, no focal deficits EYES:  eyes equal and reactive ENT/NECK:  no LAD, no JVD CARDIO: Regular rate, well-perfused, normal S1 and S2 PULM:  CTAB no wheezing or rhonchi GI/GU:  non-distended, non-tender MSK/SPINE:  No gross deformities, no edema SKIN:  no rash, atraumatic   *Additional and/or pertinent findings included in MDM below  Diagnostic and Interventional Summary    EKG Interpretation Date/Time:    Ventricular Rate:    PR Interval:    QRS Duration:    QT Interval:    QTC Calculation:   R Axis:      Text Interpretation:         Labs Reviewed  CBC WITH DIFFERENTIAL/PLATELET - Abnormal; Notable for the following components:      Result Value   RBC 3.42 (*)    Hemoglobin 8.3 (*)    HCT 28.1 (*)    MCH 24.3 (*)    MCHC 29.5 (*)    Lymphs Abs 0.6 (*)    All other components within normal limits  COMPREHENSIVE METABOLIC PANEL WITH GFR - Abnormal; Notable for the following components:   Sodium 134 (*)    Glucose, Bld 139 (*)    All other components within normal limits  URINALYSIS, ROUTINE W REFLEX MICROSCOPIC - Abnormal; Notable for the following components:   Color, Urine AMBER (*)    APPearance HAZY (*)    Hgb urine dipstick LARGE (*)    Protein, ur 30 (*)    Nitrite POSITIVE (*)    Leukocytes,Ua MODERATE (*)    Bacteria, UA RARE (*)    All other components within normal limits  LIPASE, BLOOD    CT ABDOMEN PELVIS W CONTRAST  Final Result      Medications  morphine  (PF) 4 MG/ML injection 4 mg (4 mg Intravenous Given 10/14/24 0443)  iohexol  (OMNIPAQUE ) 300 MG/ML solution 100 mL (100 mLs Intravenous Contrast Given 10/14/24 0555)     Procedures  /  Critical  Care Procedures  ED Course and Medical Decision Making  Initial Impression and Ddx Differential diagnosis includes complication of recent procedures such as bladder perforation, UTI, continued radiation cystitis.  Past medical/surgical history that increases complexity of ED encounter: None  Interpretation of Diagnostics I personally reviewed the Laboratory Testing and my interpretation is as follows: No significant blood count or electrolyte disturbance.  Suspicion for UTI  CT showing no evidence of right-sided hydronephrosis, no obvious stone  Patient Reassessment and Ultimate Disposition/Management   Patient has been on ciprofloxacin  for the past 2 days but is still showing evidence of infection in the urine.  Now having signs of hydronephrosis on the right, having continued pain.  Patient is most annoyed by the continued leakage of urine since the operation.  Soaked 3 depends and 10 pads last night alone.  Will request urology recommendations and possibly seeing the patient in person here in the emergency department.  Signed out to oncoming provider at shift change.  Patient management required discussion with the following services or consulting groups:  Urology  Complexity of Problems Addressed Acute illness or injury that poses threat of life of bodily function  Additional Data Reviewed and Analyzed Further history obtained from: Prior labs/imaging results  Additional Factors Impacting ED Encounter Risk Consideration of hospitalization  Kevin [name]. Theadore, MD [facility] Emergency Medicine [facility] [email or web address]  Final Clinical Impressions(s) / ED Diagnoses     ICD-10-CM   1. Urinary incontinence, unspecified type  R32     2. Flank pain, unspecified laterality  R10.A0     3. Acute cystitis without hematuria  N30.00       ED Discharge Orders     None        Discharge Instructions Discussed with and Provided to Patient:   Discharge  Instructions   None  Theadore Kevin HERO, MD 10/14/24 [phone]  "

## 2024-10-14 NOTE — ED Provider Notes (Signed)
 " [place] EMERGENCY DEPARTMENT AT [facility] Provider Assume Care Note I assumed care of Kevin [name] on 10/14/2024 at 730 AM from Dr. Theadore.   Briefly, Kevin [name] is a 69 y.o. male who: PMHx: Prostate cancer with remote prostatectomy with recurrence requiring radiation with subsequent radiation cystitis P/w leakage of urine, dysuria, right-sided flank pain after recent bladder fulguration with Dr. Patrcia on 10/08/2024 Was recently seen in clinic for dysuria, diagnosed with UTI, started on Cipro  Imaging today demonstrates new right sided hydronephrosis   Plan at the time of handoff: Ending urology consult   Please refer to the original providers note for additional information regarding the care of Kevin [name].  Reassessment: I personally reassessed the patient: Patient complained of ongoing pain Vital Signs:  ED Triage Vitals [10/14/24 0339]  Encounter Vitals Group     BP 116/74     Girls Systolic BP Percentile      Girls Diastolic BP Percentile      Boys Systolic BP Percentile      Boys Diastolic BP Percentile      Pulse Rate 97     Resp 20     Temp 99.1 F (37.3 C)     Temp Source Oral     SpO2 96 %     Weight      Height      Head Circumference      Peak Flow      Pain Score 7     Pain Loc      Pain Education      Exclude from Growth Chart      Hemodynamics:  The patient is hemodynamically stable. Mental Status:  The patient is alert  Ultrasound ED Renal  Date/Time: 10/14/2024 3:14 PM  Performed by: Rogelia Jerilynn RAMAN, MD Authorized by: Rogelia Jerilynn RAMAN, MD   Procedure details:    Indications comment:  Leakage of urine, eval for urinary retention   Technique:  BladderImages: not archivedStudy Limitations: bowel gas Bladder findings:    Bladder:  Visualized   Free pelvic fluid: not identified     Volume:  250cc    Additional MDM: Patient reevaluated, complains of ongoing pain.  Spoke with urology who recommends bladder scan, I performed  this personally at bedside and patient has approximately 250 cc of bladder within the urine.  Kevin Sattenfield, NP evaluate patient at bedside, the ureteral ostia were not definitively identified during the bladder fulguration, therefore it is possible that inflammation/fulguration of the ostia is contributing to patient's right-sided hydronephrosis, however patient is a poor candidate for stent due to Eliquis use, additionally patient has primary concern of ongoing bladder spasms and leakage of urine.  Foley catheter placed by urology.  They request that patient be observed for additional hours in the emergency department, and urology team will reevaluate to determine whether or not patient is appropriate for home versus if patient will require mission for further management of pain/etc.  Disposition: HANDOFF: At the time of signout, the patients reevaluation by urology had not yet been completed. I transferred care of the patient at the time of signout to Dr. Gennaro. I informed the incoming care provider of the patient's history, status, and management plan. I addressed all of their concerns and/or questions to the best of my ability. Please refer to the incoming care provider's note for details regarding the remainder of the patient's ED course and disposition.   Kevin [name] Rogelia, MD Emergency Medicine  Rogelia Jerilynn RAMAN, MD 10/14/24 1517  "

## 2024-10-14 NOTE — ED Provider Notes (Signed)
 I took over care of this patient at 3 PM pending urology recommendations.  Physical Exam  BP 122/88 (BP Location: Right Arm)   Pulse 87   Temp 98.4 F (36.9 C) (Oral)   Resp 16   SpO2 97%   ED Course / MDM   Clinical Course as of 10/14/24 1716  Wed Oct 14, 2024  0700 Protate cancer, remote prostatectomy, had recurrance, got radiation, then radiation cysitis, got a fulguration of bladder (manny 1/1), right flank pain and dysuria for 2d, on cipro  but persistent pain depsite cirpo, new right sided hydro, leaking urine sp fulguration (3x depends), talk to urology  [LS]    Clinical Course User Index [LS] Rogelia Jerilynn RAMAN, MD   Medical Decision Making Patient seen by Dr. Elisabeth urology, and she requested admission to hospitalist service. Discussed patient's case with Dr. Celinda and patient will be admitted for further workup and management.   Problems Addressed: Acute cystitis without hematuria: undiagnosed new problem with uncertain prognosis Flank pain, unspecified laterality: undiagnosed new problem with uncertain prognosis Urinary incontinence, unspecified type: undiagnosed new problem with uncertain prognosis  Amount and/or Complexity of Data Reviewed Labs: ordered. Radiology: ordered.  Risk Prescription drug management. Decision regarding hospitalization.        Gennaro Duwaine CROME, DO 10/14/24 [phone]

## 2024-10-15 ENCOUNTER — Encounter (HOSPITAL_COMMUNITY): Admission: RE | Admit: 2024-10-15 | Source: Ambulatory Visit

## 2024-10-15 LAB — COMPREHENSIVE METABOLIC PANEL WITH GFR
ALT: 9 U/L (ref 0–44)
AST: 14 U/L — ABNORMAL LOW (ref 15–41)
Albumin: 3.8 g/dL (ref 3.5–5.0)
Alkaline Phosphatase: 64 U/L (ref 38–126)
Anion gap: 11 (ref 5–15)
BUN: 14 mg/dL (ref 8–23)
CO2: 23 mmol/L (ref 22–32)
Calcium: 9.6 mg/dL (ref 8.9–10.3)
Chloride: 102 mmol/L (ref 98–111)
Creatinine, Ser: 1.07 mg/dL (ref 0.61–1.24)
GFR, Estimated: >60 mL/min
Glucose, Bld: 133 mg/dL — ABNORMAL HIGH (ref 70–99)
Potassium: 4.1 mmol/L (ref 3.5–5.1)
Sodium: 137 mmol/L (ref 135–145)
Total Bilirubin: 0.3 mg/dL (ref 0.0–1.2)
Total Protein: 7 g/dL (ref 6.5–8.1)

## 2024-10-15 LAB — CBC
HCT: 27.7 % — ABNORMAL LOW (ref 39.0–52.0)
Hemoglobin: 8.2 g/dL — ABNORMAL LOW (ref 13.0–17.0)
MCH: 24.2 pg — ABNORMAL LOW (ref 26.0–34.0)
MCHC: 29.6 g/dL — ABNORMAL LOW (ref 30.0–36.0)
MCV: 81.7 fL (ref 80.0–100.0)
Platelets: 346 K/uL (ref 150–400)
RBC: 3.39 MIL/uL — ABNORMAL LOW (ref 4.22–5.81)
RDW: 15.6 % — ABNORMAL HIGH (ref 11.5–15.5)
WBC: 8.6 K/uL (ref 4.0–10.5)
nRBC: 0 % (ref 0.0–0.2)

## 2024-10-15 MED ORDER — DIAZEPAM 5 MG PO TABS
5.0000 mg | ORAL_TABLET | Freq: Four times a day (QID) | ORAL | Status: DC | PRN
  Administered 2024-10-16 – 2024-10-17 (×3): 5 mg via ORAL
  Filled 2024-10-15 (×3): qty 1

## 2024-10-15 MED ORDER — HYDROMORPHONE HCL 1 MG/ML IJ SOLN
1.0000 mg | INTRAMUSCULAR | Status: DC | PRN
  Filled 2024-10-15: qty 1

## 2024-10-15 MED ORDER — SENNOSIDES-DOCUSATE SODIUM 8.6-50 MG PO TABS
1.0000 | ORAL_TABLET | Freq: Two times a day (BID) | ORAL | Status: DC
  Administered 2024-10-15 – 2024-10-16 (×3): 1 via ORAL
  Filled 2024-10-15 (×3): qty 1

## 2024-10-15 MED ORDER — OXYBUTYNIN CHLORIDE 5 MG PO TABS
5.0000 mg | ORAL_TABLET | Freq: Three times a day (TID) | ORAL | Status: DC
  Administered 2024-10-15: 5 mg via ORAL
  Filled 2024-10-15: qty 1

## 2024-10-15 MED ORDER — MIRABEGRON ER 25 MG PO TB24
25.0000 mg | ORAL_TABLET | Freq: Every day | ORAL | Status: DC
  Administered 2024-10-15 – 2024-10-18 (×4): 25 mg via ORAL
  Filled 2024-10-15 (×4): qty 1

## 2024-10-15 MED ORDER — POLYETHYLENE GLYCOL 3350 17 G PO PACK
17.0000 g | PACK | Freq: Once | ORAL | Status: AC
  Administered 2024-10-15: 17 g via ORAL
  Filled 2024-10-15: qty 1

## 2024-10-15 MED ORDER — HYDROMORPHONE HCL 1 MG/ML IJ SOLN
1.0000 mg | Freq: Once | INTRAMUSCULAR | Status: AC
  Administered 2024-10-15: 1 mg via INTRAVENOUS
  Filled 2024-10-15: qty 1

## 2024-10-15 MED ORDER — HYOSCYAMINE SULFATE 0.125 MG PO TBDP
0.1250 mg | ORAL_TABLET | ORAL | Status: DC | PRN
  Administered 2024-10-15: 0.125 mg via SUBLINGUAL
  Filled 2024-10-15 (×3): qty 1

## 2024-10-15 MED ORDER — HYOSCYAMINE SULFATE 0.125 MG PO TBDP
0.2500 mg | ORAL_TABLET | ORAL | Status: DC | PRN
  Administered 2024-10-15 – 2024-10-17 (×3): 0.25 mg via SUBLINGUAL
  Filled 2024-10-15 (×7): qty 2

## 2024-10-15 MED ORDER — OXYBUTYNIN CHLORIDE 5 MG PO TABS: 5.0000 mg | ORAL_TABLET | Freq: Three times a day (TID) | ORAL | Status: DC | PRN

## 2024-10-15 NOTE — Progress Notes (Addendum)
 "    Subjective: Thankfully patient was able to get some rest overnight.  Spasm has subsided and he appreciated that the Foley catheter kept him dry.  Hematuria resolved  Objective: Vital signs in last 24 hours: Temp:  [97.7 F (36.5 C)-99.7 F (37.6 C)] 98.7 F (37.1 C) (01/08 0902) Pulse Rate:  [70-87] 77 (01/08 0902) Resp:  [16-19] 18 (01/08 0902) BP: (118-133)/(83-90) 133/87 (01/08 0902) SpO2:  [92 %-98 %] 95 % (01/08 0902)  Assessment/Plan: # Gross hematuria -resolved # Bladder spasm # Radiation cystitis  Improvement in spasm with Foley catheter but patient still elected to have it removed this morning due to significant pain on the tip of the penis.  This is probably exacerbated at least somewhat by his semirigid prosthesis.  Removed this morning. Will keep him dry with condom catheter in the meantime  Long conversation with Nyaire and his wife this morning regarding the eventuality of cystectomy and ileal conduit.  We reviewed the surgery itself, as well as some images.  He is coming around to the concept with understandable reluctance.  He will discuss this further with Dr. Alvaro.  # Flank pain # Severe constipation  Appreciate hospitalist assistance.  SMOG enema was not completed last night.  Starting with Senokot, MiraLAX , and half bottle of mag citrate this morning with enema midday pending response to a.m. meds.  Hopefully we will be able to wean him from narcotic medications with bowel clearence and get him home. He understands opioids will only worsen he constipation and is ammenable.  The Oxybutynin  may be contributing to constipation as well. Hodling this for now. If he is responding well to the Hyoscyamine , I would like to send him home with this PRN and d/c oxybutynin . Will add Myrbetriq  if needed.  B&O suppositories have also been added back to market and we can trial these if needed once he is done with his bowel regimen.  #mild hydronephrosis  Recent  nephrostogram shows patency. Patient described some discomfort in the kidney as his bladder was evacuated with the Foley catheter.  This would be consistent with some vesicoureteral reflux. No percutaneous nephrostomy tube at this time    Intake/Output from previous day: 01/07 0701 - 01/08 0700 In: 360 [P.O.:60; IV Piggyback:300] Out: 1950 [Urine:1950]  Intake/Output this shift: No intake/output data recorded.  Physical Exam:  General: Alert and oriented CV: No cyanosis Lungs: equal chest rise Gu: foley catheter with clear yellow urine. Removed on rounds  Lab Results: Recent Labs    10/14/24 0452 10/15/24 0534  HGB 8.3* 8.2*  HCT 28.1* 27.7*   BMET Recent Labs    10/14/24 0452 10/15/24 0534  NA 134* 137  K 4.4 4.1  CL 100 102  CO2 24 23  GLUCOSE 139* 133*  BUN 15 14  CREATININE 1.05 1.07  CALCIUM  9.6 9.6  HGB 8.3* 8.2*  WBC 6.2 8.6     Studies/Results: CT ABDOMEN PELVIS W CONTRAST Result Date: 10/14/2024 CLINICAL DATA:  Abdominal pain. Recent cystoscopy 10/07/2024. History of prostate cancer and cutaneous T-cell lymphoma. EXAM: CT ABDOMEN AND PELVIS WITH CONTRAST TECHNIQUE: Multidetector CT imaging of the abdomen and pelvis was performed using the standard protocol following bolus administration of intravenous contrast. RADIATION DOSE REDUCTION: This exam was performed according to the departmental dose-optimization program which includes automated exposure control, adjustment of the mA and/or kV according to patient size and/or use of iterative reconstruction technique. CONTRAST:  OMNIPAQUE  IOHEXOL  300 MG/ML  SOLN COMPARISON:  Abdomen and  pelvis CT 03/26/2024 FINDINGS: Lower chest: No acute findings Hepatobiliary: No suspicious focal abnormality within the liver parenchyma. Gallbladder is surgically absent. No intrahepatic or extrahepatic biliary dilation. Pancreas: No focal mass lesion. No dilatation of the main duct. No intraparenchymal cyst. No peripancreatic  edema. Spleen: No splenomegaly. No suspicious focal mass lesion. Adrenals/Urinary Tract: No adrenal nodule or mass. 2 mm nonobstructing stone identified interpolar right kidney. Mild right hydroureteronephrosis evident with ureteral distension down to the level of the UVJ. Left kidney and ureter unremarkable. Circumferential irregular and ill-defined bladder wall thickening noted. A small focal calcification along the inferior bladder wall may be intramural or mucosal based on sagittal image 117/10. Stomach/Bowel: Tiny hiatal hernia. Stomach otherwise unremarkable. Duodenum is normally positioned as is the ligament of Treitz. 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 No small bowel wall thickening. No small bowel dilatation. No gross colonic mass. No colonic wall thickening. The appendix is normal. No gross colonic mass. No colonic wall thickening. Vascular/Lymphatic: There is mild atherosclerotic calcification of the abdominal aorta without aneurysm. There is no gastrohepatic or hepatoduodenal ligament lymphadenopathy. No retroperitoneal or mesenteric lymphadenopathy. No pelvic sidewall lymphadenopathy. Reproductive: Prostatectomy. Other: No substantial intraperitoneal free fluid. Musculoskeletal: No worrisome lytic or sclerotic osseous abnormality. T12 compression deformity is similar in the interval. IMPRESSION: 1. Circumferential irregular and ill-defined bladder wall thickening suggest infection/inflammation. 2. Mild right hydroureteronephrosis, new in the interval since 03/26/2024. Ureteral distension extends down to the level of the right UVJ. No discrete etiology for the urinary distension is evident by CT imaging. 3. 2 mm nonobstructing stone interpolar right kidney. 4. Tiny hiatal hernia. 5.  Aortic Atherosclerosis (ICD10-I70.0). Electronically Signed   By: Camellia Candle M.D.   On: 10/14/2024 06:22      LOS: 1 day   Ole Bourdon, NP [facility] Pager: [phone]  10/15/2024, 10:00 AM  "

## 2024-10-15 NOTE — TOC Initial Note (Signed)
 Transition of Care [facility]) - Initial/Assessment Note    Patient Details  Name: Kevin [name] MRN: [id number] Date of Birth: [birth year]  Transition of Care [facility]) CM/SW Contact:    Alfonse JONELLE Rex, RN Phone Number: 10/15/2024, 1:56 PM  Clinical Narrative:     Admitted from home, resides in a private residence, family contacts on file, PCP and insurance on file. INPT CM will follow for dc needs                 Barriers to Discharge: Continued Medical Work up   Patient Goals and CMS Choice Patient states their goals for this hospitalization and ongoing recovery are:: return home          Expected Discharge Plan and Services       Living arrangements for the past 2 months: Single Family Home                                      Prior Living Arrangements/Services Living arrangements for the past 2 months: Single Family Home Lives with:: Spouse Patient language and need for interpreter reviewed:: Yes        Need for Family Participation in Patient Care: Yes (Comment) Care giver support system in place?: Yes (comment)   Criminal Activity/Legal Involvement Pertinent to Current Situation/Hospitalization: No - Comment as needed  Activities of Daily Living   ADL Screening (condition at time of admission) Independently performs ADLs?: Yes (appropriate for developmental age) Is the patient deaf or have difficulty hearing?: No Does the patient have difficulty seeing, even when wearing glasses/contacts?: No Does the patient have difficulty concentrating, remembering, or making decisions?: No  Permission Sought/Granted                  Emotional Assessment       Orientation: : Oriented to Self, Oriented to Place, Oriented to  Time, Oriented to Situation Alcohol / Substance Use: Not Applicable Psych Involvement: No (comment)  Admission diagnosis:  Hydronephrosis of right kidney [N13.30] Acute cystitis without hematuria [N30.00] Urinary incontinence, unspecified  type [R32] Flank pain, unspecified laterality [M89.A0] Patient Active Problem List   Diagnosis Date Noted   Acute embolism and thrombosis of unspecified deep veins of unspecified lower extremity (HCC) 10/14/2024   Essential hypertension 10/14/2024   Hyperlipidemia 10/14/2024   Vitamin D deficiency 10/14/2024   Hydronephrosis of right kidney 10/14/2024   Acute blood loss anemia (ABLA) 10/14/2024   Excessive daytime sleepiness 10/14/2024   Hypothyroidism 10/14/2024   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   OSA (obstructive sleep apnea) 11/09/2022   Right leg DVT (HCC) 11/30/2021   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   Mechanical breakdown of implanted penile prosthesis, initial encounter 11/14/2020   Erectile dysfunction due to arterial insufficiency 06/23/2020   Ureteral obstruction, left 01/20/2017   Calculus of ureter 01/17/2017   Malignant neoplasm of prostate (HCC) 04/19/2016   Hypertension, well controlled 06/27/2015   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   PCP:  Charlott Dorn LABOR, MD Pharmacy:   [facility] [id number] - [place], [place] - [address] [place] [address] Phone: [phone] Fax: [phone]     Social Drivers of Health (SDOH) Social History: SDOH Screenings   Food Insecurity: No Food Insecurity (10/15/2024)  Housing: Low Risk (10/15/2024)  Transportation Needs: No Transportation Needs (10/15/2024)  Utilities: Not At Risk (  10/15/2024)  Social Connections: Moderately Integrated (10/15/2024)  Tobacco Use: Medium Risk (10/14/2024)   SDOH Interventions:     Readmission Risk Interventions    10/15/2024    1:54 PM  Readmission Risk Prevention Plan  Post Dischage Appt Complete  Medication Screening Complete  Transportation Screening Complete

## 2024-10-15 NOTE — Plan of Care (Signed)
 ?  Problem: Clinical Measurements: ?Goal: Will remain free from infection ?Outcome: Progressing ?  ?Problem: Clinical Measurements: ?Goal: Diagnostic test results will improve ?Outcome: Progressing ?  ?

## 2024-10-15 NOTE — Progress Notes (Signed)
 Prior-To-Admission Oral Chemotherapy for Treatment of Oncologic Disease   Order noted from Dr. Celinda to continue prior-to-admission oral chemotherapy regimen of bexarotene .  Procedure Per Pharmacy & Therapeutics Committee Policy: Orders for continuation of home oral chemotherapy for treatment of an oncologic disease will be held unless approved by an oncologist during current admission.    For patients receiving oncology care at [facility], inpatient pharmacist contacts patient's oncologist during regular office hours to review. If earlier review is medically necessary, attending physician consults [facility] on-call oncologist   For patients receiving oncology care outside of [facility], attending physician consults patient's oncologist to review. If this oncologist or their coverage cannot be reached, attending physician consults [facility] on-call oncologist  Pt's wife informed me that his oncologist is Dr. Flynn at [facility]. Per [place] msg with Dr. Casimer Dare, she will reach out to Dr. Flynn and will let us  (pharmacy) know if bexarotene  can be resumed inpatient.    Oral chemotherapy continuation order is on hold pending oncologist review, Non-[facility] oncologist Dr. Flynn from [facility]  provides oncology care and should be consulted by attending physician     Osie Critchley P 10/15/2024, 8:37 AM

## 2024-10-15 NOTE — Progress Notes (Signed)
 " PROGRESS NOTE    Kevin [name]  FMW:[id number] DOB: Dec 15, 1955 DOA: 10/14/2024 PCP: Charlott Dorn LABOR, MD  Subjective: Patient states his Foley was removed earlier this morning, and he slept after and has not had the urge to void yet.  He has not had a BM since yesterday either.  He overall feels better, denied any pain at this time.  He states his bladder spasms have also improved  Hospital Course: 69 year old male with PMH of colorblindness, HLD, HTN, mycosis fungoides cutaneous T-cell lymphoma, chronic right lower extremity DVT, left hydronephrosis, prostate cancer, recent cystoscopy with fulguration with Dr. Linward 10/07/2024 who presented to the ED with lower abdominal pain since the procedure, associated with incontinence and scrotal pain and hematuria.  His UA was suggestive of UTI, and he was started on IV antibiotics.  CT abdomen/pelvis showed ill-defined bladder wall thickening, mild right hydroureteronephrosis.  He was seen by urology as well.   Assessment and Plan:  Complicated UTI, right sided hydroureteronephrosis - had recent cystoscopy, UA suggestive of a UTI - continue cefepime  for now, f/u urine cultures and narrow Abx as appropriate - urology following - Foley was removed per patient preference, and monitor UOP - f/u with Dr. Alvaro as outpatient, does not require percutaneous nephrostomy tube at this time  Constipation - continue Senokot, MiraLAX , mag citrate - enema in the afternoon  - holding oxybutynin  as it has antimuscarinic properties that can contribute to constipation.  Utilizing hyoscyamine  for bladder spasms.  Per urology can also add on Myrbetriq  if needed  Mycosis fungoides cutaneous T-cell lymphoma - on bexarotene  from his dermatologist from [facility] system - can resume this upon discharge   OSA - on CPAP  HTN - holding amlodipine  and ARB  HLD - on atorvastatin   Hypothyroidism - on levothyroxine     DVT prophylaxis: SCDs Start: 10/14/24 1735    Code  Status: Full Code Disposition Plan: Home Reason for continuing need for hospitalization: IV antibiotics   Objective: Vitals:   10/15/24 0000 10/15/24 0146 10/15/24 0512 10/15/24 0902  BP: (!) 123/90 133/83 118/83 133/87  Pulse: 71 80 70 77  Resp: 18 18 19 18   Temp:  99 F (37.2 C) 98 F (36.7 C) 98.7 F (37.1 C)  TempSrc:  Oral Oral Oral  SpO2: 95% 95% 97% 95%    Intake/Output Summary (Last 24 hours) at 10/15/2024 1414 Last data filed at 10/15/2024 0900 Gross per 24 hour  Intake 260 ml  Output 1950 ml  Net -1690 ml   There were no vitals filed for this visit.  Examination:  Physical Exam Vitals and nursing note reviewed.  Constitutional:      General: He is not in acute distress. Cardiovascular:     Rate and Rhythm: Normal rate.  Pulmonary:     Effort: No respiratory distress.  Abdominal:     General: There is no distension.     Tenderness: There is no abdominal tenderness.  Musculoskeletal:     Right lower leg: No edema.     Left lower leg: No edema.     Data Reviewed: I have personally reviewed following labs and imaging studies  CBC: Recent Labs  Lab 10/14/24 0452 10/15/24 0534  WBC 6.2 8.6  NEUTROABS 4.5  --   HGB 8.3* 8.2*  HCT 28.1* 27.7*  MCV 82.2 81.7  PLT 321 346   Basic Metabolic Panel: Recent Labs  Lab 10/14/24 0452 10/15/24 0534  NA 134* 137  K 4.4 4.1  CL 100  102  CO2 24 23  GLUCOSE 139* 133*  BUN 15 14  CREATININE 1.05 1.07  CALCIUM  9.6 9.6   GFR: Estimated Creatinine Clearance: 74.5 mL/min (by C-G formula based on SCr of 1.07 mg/dL). Liver Function Tests: Recent Labs  Lab 10/14/24 0452 10/15/24 0534  AST 19 14*  ALT 11 9  ALKPHOS 69 64  BILITOT 0.3 0.3  PROT 8.1 7.0  ALBUMIN 3.9 3.8   Recent Labs  Lab 10/14/24 0452  LIPASE 14   No results for input(s): AMMONIA in the last 168 hours. Coagulation Profile: No results for input(s): INR, PROTIME in the last 168 hours. Cardiac Enzymes: No results for input(s):  CKTOTAL, CKMB, CKMBINDEX, TROPONINI in the last 168 hours. ProBNP, BNP (last 5 results) No results for input(s): PROBNP, BNP in the last 8760 hours. HbA1C: No results for input(s): HGBA1C in the last 72 hours. CBG: No results for input(s): GLUCAP in the last 168 hours. Lipid Profile: No results for input(s): CHOL, HDL, LDLCALC, TRIG, CHOLHDL, LDLDIRECT in the last 72 hours. Thyroid  Function Tests: No results for input(s): TSH, T4TOTAL, FREET4, T3FREE, THYROIDAB in the last 72 hours. Anemia Panel: No results for input(s): VITAMINB12, FOLATE, FERRITIN, TIBC, IRON, RETICCTPCT in the last 72 hours. Sepsis Labs: No results for input(s): PROCALCITON, LATICACIDVEN in the last 168 hours.  No results found for this or any previous visit (from the past 240 hours).   Radiology Studies: CT ABDOMEN PELVIS W CONTRAST Result Date: 10/14/2024 CLINICAL DATA:  Abdominal pain. Recent cystoscopy 10/07/2024. History of prostate cancer and cutaneous T-cell lymphoma. EXAM: CT ABDOMEN AND PELVIS WITH CONTRAST TECHNIQUE: Multidetector CT imaging of the abdomen and pelvis was performed using the standard protocol following bolus administration of intravenous contrast. RADIATION DOSE REDUCTION: This exam was performed according to the departmental dose-optimization program which includes automated exposure control, adjustment of the mA and/or kV according to patient size and/or use of iterative reconstruction technique. CONTRAST:  OMNIPAQUE  IOHEXOL  300 MG/ML  SOLN COMPARISON:  Abdomen and pelvis CT 03/26/2024 FINDINGS: Lower chest: No acute findings Hepatobiliary: No suspicious focal abnormality within the liver parenchyma. Gallbladder is surgically absent. No intrahepatic or extrahepatic biliary dilation. Pancreas: No focal mass lesion. No dilatation of the main duct. No intraparenchymal cyst. No peripancreatic edema. Spleen: No splenomegaly. No suspicious focal  mass lesion. Adrenals/Urinary Tract: No adrenal nodule or mass. 2 mm nonobstructing stone identified interpolar right kidney. Mild right hydroureteronephrosis evident with ureteral distension down to the level of the UVJ. Left kidney and ureter unremarkable. Circumferential irregular and ill-defined bladder wall thickening noted. A small focal calcification along the inferior bladder wall may be intramural or mucosal based on sagittal image 117/10. Stomach/Bowel: Tiny hiatal hernia. Stomach otherwise unremarkable. Duodenum is normally positioned as is the ligament of Treitz. 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 No small bowel wall thickening. No small bowel dilatation. No gross colonic mass. No colonic wall thickening. The appendix is normal. No gross colonic mass. No colonic wall thickening. Vascular/Lymphatic: There is mild atherosclerotic calcification of the abdominal aorta without aneurysm. There is no gastrohepatic or hepatoduodenal ligament lymphadenopathy. No retroperitoneal or mesenteric lymphadenopathy. No pelvic sidewall lymphadenopathy. Reproductive: Prostatectomy. Other: No substantial intraperitoneal free fluid. Musculoskeletal: No worrisome lytic or sclerotic osseous abnormality. T12 compression deformity is similar in the interval. IMPRESSION: 1. Circumferential irregular and ill-defined bladder wall thickening suggest infection/inflammation. 2. Mild right hydroureteronephrosis, new in the interval since 03/26/2024. Ureteral distension extends down to the level of the right UVJ. No discrete  etiology for the urinary distension is evident by CT imaging. 3. 2 mm nonobstructing stone interpolar right kidney. 4. Tiny hiatal hernia. 5.  Aortic Atherosclerosis (ICD10-I70.0). Electronically Signed   By: Camellia Candle M.D.   On: 10/14/2024 06:22    Scheduled Meds:  atorvastatin   20 mg Oral QPM   citalopram   40 mg Oral QHS   cyanocobalamin   5,000 mcg Oral q morning   levothyroxine   150 mcg Oral  QAC breakfast   pantoprazole   40 mg Oral BID   senna-docusate  1 tablet Oral BID   Continuous Infusions:  ceFEPime  (MAXIPIME ) IV 2 g (10/15/24 0954)     LOS: 1 day   Time spent: 40 minutes  Casimer Dare, MD  [facility]  10/15/2024, 2:14 PM   "

## 2024-10-16 ENCOUNTER — Inpatient Hospital Stay (HOSPITAL_COMMUNITY)

## 2024-10-16 MED ORDER — BISACODYL 10 MG RE SUPP
10.0000 mg | Freq: Once | RECTAL | Status: AC
  Administered 2024-10-16: 10 mg via RECTAL
  Filled 2024-10-16: qty 1

## 2024-10-16 MED ORDER — SENNOSIDES-DOCUSATE SODIUM 8.6-50 MG PO TABS
2.0000 | ORAL_TABLET | Freq: Two times a day (BID) | ORAL | Status: DC
  Administered 2024-10-16 – 2024-10-18 (×4): 2 via ORAL
  Filled 2024-10-16 (×4): qty 2

## 2024-10-16 MED ORDER — POLYETHYLENE GLYCOL 3350 17 G PO PACK
17.0000 g | PACK | Freq: Every day | ORAL | Status: DC
  Administered 2024-10-16 – 2024-10-18 (×3): 17 g via ORAL
  Filled 2024-10-16 (×3): qty 1

## 2024-10-16 NOTE — Progress Notes (Signed)
" ° °  °  Subjective: Intractable spasm overnight, finally controlled.  Patient is tired, sleep deprived, and understandably emotional about his medical conditions.  Reviewed everything again this morning.  All questions answered to his satisfaction.  Objective: Vital signs in last 24 hours: Temp:  [97.9 F (36.6 C)-98.4 F (36.9 C)] 97.9 F (36.6 C) (01/09 0557) Pulse Rate:  [78-90] 78 (01/09 0557) Resp:  [14-18] 14 (01/09 0557) BP: (121-141)/(79-88) 141/88 (01/09 0557) SpO2:  [96 %-97 %] 96 % (01/09 0557)  Assessment/Plan: # Gross hematuria -resolved # Bladder spasm # Radiation cystitis  Improvement in spasm.  Finally relaxed enough after the Dilaudid  to pass some urine and 1 large clot yesterday afternoon.  The volume was keeping with his PVR seen in the emergency department so unlikely that this was clot obstruction.  Spasms continue to improve as bowel regiment has progressed.  He will discuss cystectomy further with Dr. Alvaro in clinic.  # Flank pain # Severe constipation  Severe intractable bladder spasms yesterday.  Added Myrbetriq .  Continue hyoscyamine .  Had to restart oxybutynin  but would still prefer to avoid this if possible.  Patient finally completed bowel regimen and had a number of bowel movements overnight.  Pain and spasm are well-controlled afterwards.  No pain medication given since yesterday afternoon.  1 dose of Valium .  Collect KUB to reassess degree of constipation and make decision on discharge home for this afternoon.  #mild hydronephrosis  Recent nephrostogram shows patency.  Patient described some discomfort in the kidney as his bladder was evacuated with the Foley catheter.  This would be consistent with some vesicoureteral reflux.  No percutaneous nephrostomy tube at this time.  Intake/Output from previous day: 01/08 0701 - 01/09 0700 In: 1245.4 [P.O.:900; IV Piggyback:345.4] Out: 300 [Urine:300]  Intake/Output this shift: Total I/O In: 240  [P.O.:240] Out: -   Physical Exam:  General: Alert and oriented CV: No cyanosis Lungs: equal chest rise Gu: ongoing urinary leakage, though reported to be improved  Lab Results: Recent Labs    10/14/24 0452 10/15/24 0534  HGB 8.3* 8.2*  HCT 28.1* 27.7*   BMET Recent Labs    10/14/24 0452 10/15/24 0534  NA 134* 137  K 4.4 4.1  CL 100 102  CO2 24 23  GLUCOSE 139* 133*  BUN 15 14  CREATININE 1.05 1.07  CALCIUM  9.6 9.6  HGB 8.3* 8.2*  WBC 6.2 8.6     Studies/Results: No results found.     LOS: 2 days   Ole Bourdon, NP [facility] Pager: [phone]  10/16/2024, 12:09 PM  "

## 2024-10-16 NOTE — Progress Notes (Addendum)
 " PROGRESS NOTE    CORRION STIREWALT  FMW:[id number] DOB: 11/05/1955 DOA: 10/14/2024 PCP: Charlott Dorn LABOR, MD  Subjective: Reports having severe bladder spasms yesterday which have now subsided. He is having small amount of frequent stools.     Hospital Course: 69 year old male with PMH of colorblindness, HLD, HTN, mycosis fungoides cutaneous T-cell lymphoma, chronic right lower extremity DVT, left hydronephrosis, prostate cancer, recent cystoscopy with fulguration with Dr. Linward 10/07/2024 who presented to the ED with lower abdominal pain since the procedure, associated with incontinence and scrotal pain and hematuria.  His UA was suggestive of UTI, and he was started on IV antibiotics.  CT abdomen/pelvis showed ill-defined bladder wall thickening, mild right hydroureteronephrosis.  He was seen by urology as well.    Assessment and Plan:  Complicated UTI, right sided hydroureteronephrosis - had recent cystoscopy, UA suggestive of a UTI - continue cefepime  for now, unfortunately a urine culture doesn't appear to have been sent. He has previously grown E. Coli in the urine resistant to unasyn, can consider changing to cipro  at the time of discharge  - urology following - Foley was removed per patient preference, and monitor UOP - f/u with Dr. Alvaro as outpatient, does not require percutaneous nephrostomy tube at this time - bladder spasms have subsided now with mirabegron , oxybutynin  and hyoscyamine     Constipation - continue Senokot, MiraLAX , mag citrate. Oxybutynin  had to be restarted which may contribute to constipation so will have to be cautious - KUB repeated, final read pending but has prominent appearing bowels - suppository x1 and if needed can get an additional enema  UPDATE: KUB showed dilated loops suggesting ileus. He denied any nausea/vomiting and has been tolerating PO diet. Continue to monitor    Mycosis fungoides cutaneous T-cell lymphoma - on bexarotene  from his  dermatologist from [facility] system - can resume this upon discharge    OSA - on CPAP   HTN - holding amlodipine  and ARB   HLD - on atorvastatin    Hypothyroidism - on levothyroxine       DVT prophylaxis: SCDs Start: 10/14/24 1735    Code Status: Full Code Disposition Plan: Home Reason for continuing need for hospitalization: medical readiness  Objective: Vitals:   10/15/24 1546 10/15/24 2123 10/16/24 0557 10/16/24 1408  BP: 121/79 124/85 (!) 141/88 136/78  Pulse: 90 90 78 83  Resp: 18 15 14    Temp: 98.4 F (36.9 C) 98.4 F (36.9 C) 97.9 F (36.6 C) 98.6 F (37 C)  TempSrc:  Oral Oral Oral  SpO2: 96% 97% 96% 99%    Intake/Output Summary (Last 24 hours) at 10/16/2024 1609 Last data filed at 10/16/2024 1400 Gross per 24 hour  Intake 1605.35 ml  Output 300 ml  Net 1305.35 ml   There were no vitals filed for this visit.  Examination:  Physical Exam Vitals and nursing note reviewed.  Constitutional:      General: He is not in acute distress. Cardiovascular:     Rate and Rhythm: Normal rate.  Pulmonary:     Effort: No respiratory distress.  Abdominal:     General: There is no distension.     Tenderness: There is no abdominal tenderness.  Musculoskeletal:     Right lower leg: No edema.     Left lower leg: No edema.     Data Reviewed: I have personally reviewed following labs and imaging studies  CBC: Recent Labs  Lab 10/14/24 0452 10/15/24 0534  WBC 6.2 8.6  NEUTROABS 4.5  --  HGB 8.3* 8.2*  HCT 28.1* 27.7*  MCV 82.2 81.7  PLT 321 346   Basic Metabolic Panel: Recent Labs  Lab 10/14/24 0452 10/15/24 0534  NA 134* 137  K 4.4 4.1  CL 100 102  CO2 24 23  GLUCOSE 139* 133*  BUN 15 14  CREATININE 1.05 1.07  CALCIUM  9.6 9.6   GFR: Estimated Creatinine Clearance: 74.5 mL/min (by C-G formula based on SCr of 1.07 mg/dL). Liver Function Tests: Recent Labs  Lab 10/14/24 0452 10/15/24 0534  AST 19 14*  ALT 11 9  ALKPHOS 69 64  BILITOT 0.3 0.3   PROT 8.1 7.0  ALBUMIN 3.9 3.8   Recent Labs  Lab 10/14/24 0452  LIPASE 14   No results for input(s): AMMONIA in the last 168 hours. Coagulation Profile: No results for input(s): INR, PROTIME in the last 168 hours. Cardiac Enzymes: No results for input(s): CKTOTAL, CKMB, CKMBINDEX, TROPONINI in the last 168 hours. ProBNP, BNP (last 5 results) No results for input(s): PROBNP, BNP in the last 8760 hours. HbA1C: No results for input(s): HGBA1C in the last 72 hours. CBG: No results for input(s): GLUCAP in the last 168 hours. Lipid Profile: No results for input(s): CHOL, HDL, LDLCALC, TRIG, CHOLHDL, LDLDIRECT in the last 72 hours. Thyroid  Function Tests: No results for input(s): TSH, T4TOTAL, FREET4, T3FREE, THYROIDAB in the last 72 hours. Anemia Panel: No results for input(s): VITAMINB12, FOLATE, FERRITIN, TIBC, IRON, RETICCTPCT in the last 72 hours. Sepsis Labs: No results for input(s): PROCALCITON, LATICACIDVEN in the last 168 hours.  No results found for this or any previous visit (from the past 240 hours).   Radiology Studies: No results found.  Scheduled Meds:  atorvastatin   20 mg Oral QPM   bisacodyl   10 mg Rectal Once   citalopram   40 mg Oral QHS   cyanocobalamin   5,000 mcg Oral q morning   levothyroxine   150 mcg Oral QAC breakfast   mirabegron  ER  25 mg Oral Daily   pantoprazole   40 mg Oral BID   polyethylene glycol  17 g Oral Daily   senna-docusate  2 tablet Oral BID   Continuous Infusions:  ceFEPime  (MAXIPIME ) IV 2 g (10/16/24 0936)     LOS: 2 days   Time spent: 40 minutes  Casimer Dare, MD  [facility]  10/16/2024, 4:09 PM   "

## 2024-10-17 ENCOUNTER — Inpatient Hospital Stay (HOSPITAL_COMMUNITY)

## 2024-10-17 DIAGNOSIS — N1330 Unspecified hydronephrosis: Secondary | ICD-10-CM | POA: Diagnosis not present

## 2024-10-17 LAB — CBC
HCT: 31 % — ABNORMAL LOW (ref 39.0–52.0)
Hemoglobin: 9.1 g/dL — ABNORMAL LOW (ref 13.0–17.0)
MCH: 23.7 pg — ABNORMAL LOW (ref 26.0–34.0)
MCHC: 29.4 g/dL — ABNORMAL LOW (ref 30.0–36.0)
MCV: 80.7 fL (ref 80.0–100.0)
Platelets: 438 K/uL — ABNORMAL HIGH (ref 150–400)
RBC: 3.84 MIL/uL — ABNORMAL LOW (ref 4.22–5.81)
RDW: 16 % — ABNORMAL HIGH (ref 11.5–15.5)
WBC: 10.4 K/uL (ref 4.0–10.5)
nRBC: 0 % (ref 0.0–0.2)

## 2024-10-17 LAB — PHOSPHORUS: Phosphorus: 2.7 mg/dL (ref 2.5–4.6)

## 2024-10-17 LAB — BASIC METABOLIC PANEL WITH GFR
Anion gap: 12 (ref 5–15)
BUN: 13 mg/dL (ref 8–23)
CO2: 23 mmol/L (ref 22–32)
Calcium: 9.5 mg/dL (ref 8.9–10.3)
Chloride: 100 mmol/L (ref 98–111)
Creatinine, Ser: 1.08 mg/dL (ref 0.61–1.24)
GFR, Estimated: >60 mL/min
Glucose, Bld: 113 mg/dL — ABNORMAL HIGH (ref 70–99)
Potassium: 4.2 mmol/L (ref 3.5–5.1)
Sodium: 135 mmol/L (ref 135–145)

## 2024-10-17 LAB — MAGNESIUM: Magnesium: 2.6 mg/dL — ABNORMAL HIGH (ref 1.7–2.4)

## 2024-10-17 NOTE — Plan of Care (Signed)
 ?  Problem: Clinical Measurements: ?Goal: Ability to maintain clinical measurements within normal limits will improve ?Outcome: Progressing ?Goal: Will remain free from infection ?Outcome: Progressing ?Goal: Diagnostic test results will improve ?Outcome: Progressing ?  ?

## 2024-10-17 NOTE — Plan of Care (Signed)

## 2024-10-17 NOTE — Progress Notes (Signed)
 [facility] Progress Note  Patient: Kevin [name]    FMW:[id number]  DOA: 10/14/2024     Date of Service: the patient was seen and examined on 10/17/2024  Chief Complaint  Patient presents with   Abdominal Pain   Brief hospital course: 69 year old male with PMH of colorblindness, HLD, HTN, mycosis fungoides cutaneous T-cell lymphoma, chronic right lower extremity DVT, left hydronephrosis, prostate cancer, recent cystoscopy with fulguration with Dr. Linward 10/07/2024 who presented to the ED with lower abdominal pain since the procedure, associated with incontinence and scrotal pain and hematuria. His UA was suggestive of UTI, and he was started on IV antibiotics. CT abdomen/pelvis showed ill-defined bladder wall thickening, mild right hydroureteronephrosis. He was seen by urology as well.    Assessment and Plan:  # Complicated UTI, right sided hydroureteronephrosis - had recent cystoscopy, UA suggestive of a UTI - continue cefepime  for now, unfortunately a urine culture doesn't appear to have been sent. He has previously grown E. Coli in the urine resistant to unasyn, can consider changing to cipro  at the time of discharge  - urology following - Foley was removed per patient preference, and monitor UOP - f/u with Dr. Alvaro as outpatient, does not require percutaneous nephrostomy tube at this time - bladder spasms have subsided now with mirabegron , oxybutynin  and hyoscyamine   1/10 follow urine culture   # Constipation - continue Senokot, MiraLAX , mag citrate. Oxybutynin  had to be restarted which may contribute to constipation so will have to be cautious 1/9 KUB showed dilated loops suggesting ileus. He denied any nausea/vomiting and has been tolerating PO diet. Continue t - suppository x1 and if needed can get an additional enema  1/10 KUB: Persistent mild ileus.  General surgery consulted, recommended medical management but patient will be seen. Patient was advised to ambulate   #  Mycosis fungoides cutaneous T-cell lymphoma - on bexarotene  from his dermatologist from [facility] system - can resume this upon discharge    OSA - on CPAP   HTN - holding amlodipine  and ARB   HLD - on atorvastatin    Hypothyroidism - on levothyroxine      There is no height or weight on file to calculate BMI.  Interventions:  Diet: Clear liquid diet DVT Prophylaxis: SCDs  Advance goals of care discussion: Full code  Family Communication: family was present at bedside, at the time of interview.  The pt provided permission to discuss medical plan with the family. Opportunity was given to ask question and all questions were answered satisfactorily.   Disposition:  Pt is from home, admitted with bladder spasm, found to have ileus, still has ileus, unable to pass gas and BM, which precludes a safe discharge. Discharge to home, when stable, may need 1-2 more days to improve.  Subjective: No significant events overnight.  Patient was admitted for discomfort, abdominal tenderness, has not passed BM yet and no gas.  Denies any chest pain or potation, no shortness of breath.  Physical Exam: General: NAD, lying comfortably Appear in no distress, affect appropriate Eyes: PERRLA ENT: Oral Mucosa Clear, moist  Neck: no JVD,  Cardiovascular: S1 and S2 Present, no Murmur,  Respiratory: good respiratory effort, Bilateral Air entry equal and Decreased, no Crackles, no wheezes Abdomen: BS positive but sluggish, Soft and mild generalized tenderness,  Skin: no rashes Extremities: no Pedal edema, no calf tenderness Neurologic: without any new focal findings Gait not checked due to patient safety concerns  Vitals:   10/16/24 1408 10/16/24 2101 10/17/24 0600 10/17/24  1356  BP: 136/78 123/89 (!) 128/90 (!) 149/97  Pulse: 83 78 68 74  Resp:  16 15 18   Temp: 98.6 F (37 C) 99.2 F (37.3 C) 98.2 F (36.8 C) 98.6 F (37 C)  TempSrc: Oral Oral Oral Oral  SpO2: 99% 99% 97% 99%    Intake/Output  Summary (Last 24 hours) at 10/17/2024 1520 Last data filed at 10/17/2024 1432 Gross per 24 hour  Intake 1324.65 ml  Output 50 ml  Net 1274.65 ml   There were no vitals filed for this visit.  Data Reviewed: I have personally reviewed and interpreted daily labs, tele strips, imagings as discussed above. I reviewed all nursing notes, pharmacy notes, vitals, pertinent old records I have discussed plan of care as described above with RN and patient/family.  CBC: Recent Labs  Lab 10/14/24 0452 10/15/24 0534  WBC 6.2 8.6  NEUTROABS 4.5  --   HGB 8.3* 8.2*  HCT 28.1* 27.7*  MCV 82.2 81.7  PLT 321 346   Basic Metabolic Panel: Recent Labs  Lab 10/14/24 0452 10/15/24 0534  NA 134* 137  K 4.4 4.1  CL 100 102  CO2 24 23  GLUCOSE 139* 133*  BUN 15 14  CREATININE 1.05 1.07  CALCIUM  9.6 9.6    Studies: DG Abd 1 View Result Date: 10/17/2024 CLINICAL DATA:  Small bowel obstruction. EXAM: ABDOMEN - 1 VIEW COMPARISON:  10/16/2024 and CT abdomen pelvis 10/14/2024. FINDINGS: Borderline no unexpected radiopaque calculi. Surgical clips in the right upper quadrant and pelvis. Gaseous distention of small bowel with gas seen in the ascending and proximal transverse colon. Gas and stool in the rectum. IMPRESSION: Persistent mild ileus. Electronically Signed   By: Newell Eke M.D.   On: 10/17/2024 14:04    Scheduled Meds:  atorvastatin   20 mg Oral QPM   citalopram   40 mg Oral QHS   cyanocobalamin   5,000 mcg Oral q morning   levothyroxine   150 mcg Oral QAC breakfast   mirabegron  ER  25 mg Oral Daily   pantoprazole   40 mg Oral BID   polyethylene glycol  17 g Oral Daily   senna-docusate  2 tablet Oral BID   Continuous Infusions:  ceFEPime  (MAXIPIME ) IV 2 g (10/17/24 0908)   PRN Meds: acetaminophen  **OR** acetaminophen , diazepam , HYDROmorphone  (DILAUDID ) injection, hyoscyamine , magnesium  citrate, ondansetron  **OR** ondansetron  (ZOFRAN ) IV, oxybutynin , oxyCODONE   Time spent: 55  minutes  Author: ELVAN SOR. MD [facility] 10/17/2024 3:20 PM  To reach On-call, see care teams to locate the attending and reach out to them via [email or web address]. If 7PM-7AM, please contact night-coverage If you still have difficulty reaching the attending provider, please page the [facility] (Director on Call) for [facility] on amion for assistance.

## 2024-10-17 NOTE — Progress Notes (Signed)
 Urology Inpatient Progress Report  Hydronephrosis of right kidney [N13.30] Acute cystitis without hematuria [N30.00] Urinary incontinence, unspecified type [R32] Flank pain, unspecified laterality [R10.A0]        Intv/Subj: Bladder spasms much better today.  Subjectively voiding without hematuria.  Still having problems with stools however.  Principal Problem:   Hydronephrosis of right kidney Active Problems:   Malignant neoplasm of prostate (HCC)   Mechanical breakdown of implanted penile prosthesis   OSA (obstructive sleep apnea)   Essential hypertension   Hyperlipidemia   Acute blood loss anemia (ABLA)   Hypothyroidism  Current Facility-Administered Medications  Medication Dose Route Frequency Provider Last Rate Last Admin   acetaminophen  (TYLENOL ) tablet 650 mg  650 mg Oral Q6H PRN Celinda Alm Lot, MD       Or   acetaminophen  (TYLENOL ) suppository 650 mg  650 mg Rectal Q6H PRN Celinda Alm Lot, MD       atorvastatin  (LIPITOR) tablet 20 mg  20 mg Oral QPM Celinda Alm Lot, MD   20 mg at 10/16/24 1638   ceFEPIme  (MAXIPIME ) 2 g in sodium chloride  0.9 % 100 mL IVPB  2 g Intravenous Q8H Celinda Alm Lot, MD 200 mL/hr at 10/17/24 0908 2 g at 10/17/24 0908   citalopram  (CELEXA ) tablet 40 mg  40 mg Oral QHS Celinda Alm Lot, MD   40 mg at 10/16/24 2132   cyanocobalamin  (VITAMIN B12) tablet 5,000 mcg  5,000 mcg Oral q morning Celinda Alm Lot, MD   5,000 mcg at 10/17/24 9096   diazepam  (VALIUM ) tablet 5 mg  5 mg Oral Q6H PRN Delia Ole ORN, NP   5 mg at 10/16/24 1949   HYDROmorphone  (DILAUDID ) injection 1 mg  1 mg Intravenous Q4H PRN Caleen Colander, MD       hyoscyamine  (ANASPAZ ) disintergrating tablet 0.25 mg  0.25 mg Sublingual Q4H PRN Delia Ole ORN, NP   0.25 mg at 10/17/24 9276   levothyroxine  (SYNTHROID ) tablet 150 mcg  150 mcg Oral QAC breakfast Celinda Alm Lot, MD   150 mcg at 10/17/24 0535   magnesium  citrate solution 0.5 Bottle  0.5  Bottle Oral PRN Celinda Alm Lot, MD   0.5 Bottle at 10/16/24 1950   mirabegron  ER (MYRBETRIQ ) tablet 25 mg  25 mg Oral Daily Delia Ole ORN, NP   25 mg at 10/17/24 9096   ondansetron  (ZOFRAN ) tablet 4 mg  4 mg Oral Q6H PRN Celinda Alm Lot, MD       Or   ondansetron  (ZOFRAN ) injection 4 mg  4 mg Intravenous Q6H PRN Celinda Alm Lot, MD       oxybutynin  (DITROPAN ) tablet 5 mg  5 mg Oral Q8H PRN Delia Ole ORN, NP       oxyCODONE  (Oxy IR/ROXICODONE ) immediate release tablet 5 mg  5 mg Oral Q4H PRN Celinda Alm Lot, MD       pantoprazole  (PROTONIX ) EC tablet 40 mg  40 mg Oral BID Celinda Alm Lot, MD   40 mg at 10/17/24 9096   polyethylene glycol (MIRALAX  / GLYCOLAX ) packet 17 g  17 g Oral Daily Caleen Colander, MD   17 g at 10/17/24 9096   senna-docusate (Senokot-S) tablet 2 tablet  2 tablet Oral BID Caleen Colander, MD   2 tablet at 10/17/24 0903     Objective: Vital: Vitals:   10/16/24 0557 10/16/24 1408 10/16/24 2101 10/17/24 0600  BP: (!) 141/88 136/78 123/89 (!) 128/90  Pulse: 78 83 78 68  Resp: 14  16  15  Temp: 97.9 F (36.6 C) 98.6 F (37 C) 99.2 F (37.3 C) 98.2 F (36.8 C)  TempSrc: Oral Oral Oral Oral  SpO2: 96% 99% 99% 97%   I/Os: I/O last 3 completed shifts: In: 2450 [P.O.:1850; IV Piggyback:600] Out: 300 [Urine:300]  Physical Exam:  General: Patient is in no apparent distress Ext: lower extremities symmetric  Lab Results: Recent Labs    10/15/24 0534  WBC 8.6  HGB 8.2*  HCT 27.7*   Recent Labs    10/15/24 0534  NA 137  K 4.1  CL 102  CO2 23  GLUCOSE 133*  BUN 14  CREATININE 1.07  CALCIUM  9.6   No results for input(s): LABPT, INR in the last 72 hours. No results for input(s): LABURIN in the last 72 hours. Results for orders placed or performed during the hospital encounter of 02/23/21  Fungus Culture With Stain     Status: None   Collection Time: 02/23/21  1:02 PM   Specimen: PATH Cytology Misc. fluid; Body Fluid   Result Value Ref Range Status   Fungus Stain Final report  Final   Fungus (Mycology) Culture Final report  Final    Comment: (NOTE) Performed At: [facility] [address] [place], [place] [id number] Jennette Shorter MD Ey:[id number]    Fungal Source FLUID  Final    Comment: IPP FLUID Performed at [facility], [address], [place], [place] [address]   Aerobic/Anaerobic Culture w Gram Stain (surgical/deep wound)     Status: None   Collection Time: 02/23/21  1:02 PM   Specimen: Fluid  Result Value Ref Range Status   Specimen Description   Final    FLUID IPP FLUID Performed at [facility], [address] [place], [place] [address]    Special Requests   Final    NONE Performed at [facility], [address], [place], [place] [address]    Gram Stain NO WBC SEEN NO ORGANISMS SEEN   Final   Culture   Final    No growth aerobically or anaerobically. Performed at [facility], [address], [place], [place] [address]    Report Status 02/28/2021 FINAL  Final  Fungus Culture Result     Status: None   Collection Time: 02/23/21  1:02 PM  Result Value Ref Range Status   Result 1 Comment  Final    Comment: (NOTE) KOH/Calcofluor preparation:  no fungus observed. Performed At: [facility] [address] [place], [place] [id number] Jennette Shorter MD Ey:[id number]   Fungal organism reflex     Status: None   Collection Time: 02/23/21  1:02 PM  Result Value Ref Range Status   Fungal result 1 Comment  Final    Comment: (NOTE) No yeast or mold isolated after 4 weeks. Performed At: [facility] [address] [place], [place] [id number] Jennette Shorter MD Ey:[id number]     Studies/Results: DG Abd 1 View Result Date: 10/16/2024 CLINICAL DATA:  Constipation. EXAM: ABDOMEN - 1 VIEW COMPARISON:  CT 2 days ago 10/14/2024 FINDINGS: Increased air throughout nondilated small bowel centrally. Air  throughout nondilated colon. Small volume of formed colonic stool on the current exam. Multiple surgical clips in the pelvis. Penile prosthesis in place. The right renal stone on recent CT is not seen by radiograph. Cholecystectomy clips. IMPRESSION: Increased air throughout nondilated small bowel and colon, suggesting ileus. Small volume of formed colonic stool on the current exam. Electronically Signed  By: Andrea Gasman M.D.   On: 10/16/2024 16:12    Assessment: Radiation cystitis with gross hematuria, improving Severe constipation Mild hydronephrosis, nonobstructive  Plan: Okay for discharge from urological standpoint with Myrbetriq  and low-dose diazepam    Sherwood Edison, MD Urology 10/17/2024, 1:22 PM

## 2024-10-18 ENCOUNTER — Other Ambulatory Visit (HOSPITAL_COMMUNITY): Payer: Self-pay

## 2024-10-18 DIAGNOSIS — N1330 Unspecified hydronephrosis: Secondary | ICD-10-CM | POA: Diagnosis not present

## 2024-10-18 LAB — CBC
HCT: 28.2 % — ABNORMAL LOW (ref 39.0–52.0)
Hemoglobin: 8.4 g/dL — ABNORMAL LOW (ref 13.0–17.0)
MCH: 24.1 pg — ABNORMAL LOW (ref 26.0–34.0)
MCHC: 29.8 g/dL — ABNORMAL LOW (ref 30.0–36.0)
MCV: 80.8 fL (ref 80.0–100.0)
Platelets: 373 K/uL (ref 150–400)
RBC: 3.49 MIL/uL — ABNORMAL LOW (ref 4.22–5.81)
RDW: 15.6 % — ABNORMAL HIGH (ref 11.5–15.5)
WBC: 8 K/uL (ref 4.0–10.5)
nRBC: 0 % (ref 0.0–0.2)

## 2024-10-18 LAB — BASIC METABOLIC PANEL WITH GFR
Anion gap: 13 (ref 5–15)
BUN: 12 mg/dL (ref 8–23)
CO2: 22 mmol/L (ref 22–32)
Calcium: 9.2 mg/dL (ref 8.9–10.3)
Chloride: 102 mmol/L (ref 98–111)
Creatinine, Ser: 1.02 mg/dL (ref 0.61–1.24)
GFR, Estimated: >60 mL/min
Glucose, Bld: 117 mg/dL — ABNORMAL HIGH (ref 70–99)
Potassium: 4 mmol/L (ref 3.5–5.1)
Sodium: 137 mmol/L (ref 135–145)

## 2024-10-18 LAB — PHOSPHORUS: Phosphorus: 3.4 mg/dL (ref 2.5–4.6)

## 2024-10-18 LAB — URINE CULTURE: Culture: NO GROWTH

## 2024-10-18 LAB — VITAMIN B12: Vitamin B-12: >4000 pg/mL — ABNORMAL HIGH (ref 180–914)

## 2024-10-18 LAB — IRON AND TIBC
Iron: 18 ug/dL — ABNORMAL LOW (ref 45–182)
Saturation Ratios: 5 % — ABNORMAL LOW (ref 17.9–39.5)
TIBC: 406 ug/dL (ref 250–450)
UIBC: 388 ug/dL

## 2024-10-18 LAB — FOLATE: Folate: 14.4 ng/mL

## 2024-10-18 LAB — MAGNESIUM: Magnesium: 2.7 mg/dL — ABNORMAL HIGH (ref 1.7–2.4)

## 2024-10-18 MED ORDER — CIPROFLOXACIN HCL 500 MG PO TABS: 500.0000 mg | ORAL_TABLET | Freq: Two times a day (BID) | ORAL | 0 refills | Status: AC

## 2024-10-18 MED ORDER — MIRABEGRON ER 25 MG PO TB24: 25.0000 mg | ORAL_TABLET | Freq: Every day | ORAL | 0 refills | Status: DC

## 2024-10-18 MED ORDER — CIPROFLOXACIN HCL 500 MG PO TABS
500.0000 mg | ORAL_TABLET | Freq: Two times a day (BID) | ORAL | 0 refills | Status: DC
  Filled 2024-10-18: qty 10, 5d supply, fill #0

## 2024-10-18 MED ORDER — HYOSCYAMINE SULFATE 0.125 MG PO TBDP
0.2500 mg | ORAL_TABLET | ORAL | 0 refills | Status: DC | PRN
  Filled 2024-10-18: qty 30, 3d supply, fill #0

## 2024-10-18 MED ORDER — ACIDOPHILUS PO CAPS
ORAL_CAPSULE | Freq: Three times a day (TID) | ORAL | 0 refills | Status: DC
  Filled 2024-10-18: qty 21, 7d supply, fill #0

## 2024-10-18 MED ORDER — MIRABEGRON ER 25 MG PO TB24
25.0000 mg | ORAL_TABLET | Freq: Every day | ORAL | 0 refills | Status: DC
  Filled 2024-10-18: qty 30, 30d supply, fill #0

## 2024-10-18 MED ORDER — HYOSCYAMINE SULFATE 0.125 MG PO TBDP: 0.2500 mg | ORAL_TABLET | ORAL | 0 refills | Status: AC | PRN

## 2024-10-18 MED ORDER — ACIDOPHILUS PO CAPS: 1.0000 | ORAL_CAPSULE | Freq: Three times a day (TID) | ORAL | 0 refills | Status: AC

## 2024-10-18 NOTE — Plan of Care (Signed)
 ?  Problem: Clinical Measurements: ?Goal: Will remain free from infection ?Outcome: Progressing ?  ?

## 2024-10-18 NOTE — Discharge Summary (Signed)
 "  Physician Discharge Summary  Kevin [name] FMW:[id number] DOB: 1956-02-21 DOA: 10/14/2024  PCP: Charlott Dorn LABOR, MD  Admit date: 10/14/2024 Discharge date: 10/18/2024  Admitted from: Home Discharge disposition: Home  Recommendations at discharge:  Complete the course of antibiotics with 5 more days of ciprofloxacin  with probiotics Follow-up with urology as an outpatient Plan with urology on the timing of reinitiation of Xarelto. Continue scheduled and as needed bowel regimen   Subjective: Patient was seen and examined this morning.   [name].  Propped up on bed. Not in distress.  Wife at bedside. Head multiple bowel movements in the last 24 hours with laxatives. Afebrile, hemodynamically stable  Brief narrative: Kevin [name] is a 69 y.o. male with PMH significant for colorblindness, HTN, HLD, mycosis fungoides cutaneous T-cell lymphoma, chronic right lower extremity DVT, left hydronephrosis, prostate cancer, recent cystoscopy with fulguration with Dr. Christopher.  On 10/07/2024. 1/7, patient presented today ED with severe abdominal pain, right-sided flank pain, urinary incontinence.  In the ED, urinalysis showed hazy amber-colored urine with large hemoglobin, moderate leukocytes, positive nitrate, rare bacteria. CT abdomen pelvis showed ill-defined bladder wall thickening, mild right hydroureteronephrosis He also had a Foley catheter inserted in the ED. Started on IV cefepime  Admitted to [facility] Seen by urology.  Hospital course: Complicated UTI Presented with severe abdominal pain, right flank pain after 1 week of cystoscopy  Urinalysis suggestive of infection as above. Urine culture did not show any growth.  Previous urine culture had shown E. Coli. Currently improving on IV cefepime .  Patient and wife state that he had responded well to ciprofloxacin  in the past.  Will discharge with 5 more days of ciprofloxacin  with probiotics  Urinary  incontinence Bladder spasms Hematuria Patient reported urinary incontinence in the ED, had Foley catheter placed in.  Following that, he had decompression hematuria.  He also had intractable bladder spasms in the first 1 to 2 days.  Seen by urology. Bladder spasms today gradually improved.  Hematuria improved.  Foley catheter was removed. To f/u with Dr. Alvaro as outpatient, does not require percutaneous nephrostomy tube at this time Pain regimen --- Scheduled: Myrbetriq  25 mg daily,  --- PRN: Ditropan  5 mg 3 times daily, hyoscyamine , oxycodone , Tylenol   Complicated urologic history H/o high risk prostate cancer-s/p RRP by Dr. Charles of [facility] in 2017.  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  He has undergone hyperbaric oxygen  for 2 courses.  He underwent cystoscopy with fulguration with Dr. Alvaro on 10/07/2024.  Constipation 1/9 KUB showed dilated loops suggesting ileus. 1/10 KUB: Persistent mild ileus.  General surgery consulted, recommended medical management but patient will be seen. Started on bowel regimen.  Encouraged to ambulate Has had multiple bowel movements in the last 24 hours. 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  Did not require blood transfusion this hospitalization Continue PPI, vitamin B12 supplement Recent Labs    10/07/24 1337 10/14/24 0452 10/15/24 0534 10/17/24 1442 10/18/24 0500  HGB 9.6* 8.3* 8.2* 9.1* 8.4*  MCV 83.9 82.2 81.7 80.7 80.8  FOLATE  --   --   --   --  14.4   Chronic right lower extremity DVT Patient states he has been on Xarelto for last 3 years but it was held for  recent surgery on 12/31 and has not been resumed since.  Patient reports that he has a  follow-up with urology in the next 1 to 2 weeks.  He reports that there is a consideration being made for IVC filter placement to get him off anticoagulation.  Mycosis fungoides cutaneous T-cell lymphoma Continue bexarotene  from his dermatologist from [facility]   OSA CPAP   HTN  PTA meds- amlodipine  5 mg daily, valsartan  160 mg daily Continue same   HLD atorvastatin    Hypothyroidism Levothyroxine   Anxiety/depression Celexa  daily  Able to ambulate independently  Goals of care   Code Status: Full Code   Diet:  Diet Order             DIET SOFT Room service appropriate? Yes; Fluid consistency: Thin  Diet effective now           Diet general                   Nutritional status:  There is no height or weight on file to calculate BMI.       Wounds:  -    Discharge Medications:   Allergies as of 10/18/2024       Reactions   Zestril [lisinopril] Other (See Comments), Cough   ED, also        Medication List     TAKE these medications    amLODipine -valsartan  5-160 MG tablet Commonly known as: EXFORGE  Take 1 tablet by mouth in the morning.   atorvastatin  20 MG tablet Commonly known as: LIPITOR Take 20 mg by mouth 2 (two) times daily.   bexarotene  75 MG Caps capsule Commonly known as: TARGRETIN  Take 150 mg by mouth at bedtime. Give with food. Protect from light. CAUTION: Chemotherapy/Biotherapy   ciprofloxacin  500 MG tablet Commonly known as: CIPRO  Take 1 tablet (500 mg total) by mouth 2 (two) times daily for 5 days.   citalopram  40 MG tablet Commonly known as: CELEXA  Take 40 mg by mouth at bedtime.   Cyanocobalamin  5000 MCG Caps Take 5,000 mcg by mouth in the morning.   hyoscyamine  0.125 MG Tbdp disintergrating tablet Commonly known as: ANASPAZ  Place 2 tablets (0.25 mg total) under the tongue every 4 (four) hours as needed for bladder spasms or cramping.   lactobacillus acidophilus & bulgar chewable tablet Chew 1 tablet by mouth 3  (three) times daily with meals for 7 days.   levothyroxine  50 MCG tablet Commonly known as: SYNTHROID  Take 50 mcg by mouth daily before breakfast.   levothyroxine  100 MCG tablet Commonly known as: SYNTHROID  Take 100 mcg by mouth daily before breakfast.   METAMUCIL PO Take 4-6 g by mouth See admin instructions. Mix 1 tablesponful (4-6 grams) into a glass of water  and drink by mouth once a day   MILK THISTLE PO Take 2,000-4,000 mg by mouth See admin instructions. Take 2,000 mg by mouth in the morning and 4,000 mg in the evening   mirabegron  ER 25 MG Tb24 tablet Commonly known as: MYRBETRIQ  Take 1 tablet (25 mg total) by mouth daily. Start taking on: October 19, 2024   oxybutynin  5 MG tablet Commonly known as: DITROPAN  Take 1 tablet (5 mg total) by mouth every 8 (eight) hours as needed for bladder spasms.   oxyCODONE -acetaminophen  5-325 MG tablet Commonly known as: Percocet Take 1 tablet by mouth every 6 (six) hours as needed for moderate pain (pain score 4-6) or severe pain (pain score 7-10) (post-operatively).   pantoprazole  40 MG tablet Commonly  known as: PROTONIX  Take 40 mg by mouth 2 (two) times daily before a meal.   phenazopyridine  200 MG tablet Commonly known as: Pyridium  Take 1 tablet (200 mg total) by mouth 3 (three) times daily as needed (Dysuria).   Red Yeast Rice 600 MG Caps Take 600-1,200 mg by mouth See admin instructions. Take 600 mg by mouth in the morning and 1,200 mg in the evening   rivaroxaban 20 MG Tabs tablet Commonly known as: XARELTO Take 20 mg by mouth every evening.   senna-docusate 8.6-50 MG tablet Commonly known as: Senokot-S Take 1 tablet by mouth 2 (two) times daily. While taking strong pain meds to prevent constipation. What changed:  when to take this additional instructions   triamcinolone  cream 0.1 % Commonly known as: KENALOG  Apply 1 Application topically daily as needed (for iritation- affected areas).         Follow ups:     Follow-up Information     Charlott Dorn LABOR, MD Follow up.   Specialty: Internal Medicine Contact information: [address] [place] [address] [phone]                 Discharge Instructions:   Discharge Instructions     Call MD for:  difficulty breathing, headache or visual disturbances   Complete by: As directed    Call MD for:  extreme fatigue   Complete by: As directed    Call MD for:  hives   Complete by: As directed    Call MD for:  persistant dizziness or light-headedness   Complete by: As directed    Call MD for:  persistant nausea and vomiting   Complete by: As directed    Call MD for:  severe uncontrolled pain   Complete by: As directed    Call MD for:  temperature >100.4   Complete by: As directed    Diet general   Complete by: As directed    Discharge instructions   Complete by: As directed    Recommendations at discharge:   Complete the course of antibiotics with 5 more days of ciprofloxacin  with probiotics  Follow-up with urology as an outpatient  Plan with urology on the timing of reinitiation of Xarelto.  Continue scheduled and as needed bowel regimen  General discharge instructions: Follow with Primary MD Charlott Dorn LABOR, MD in 7 days  Please request your PCP  to go over your hospital tests, procedures, radiology results at the follow up. Please get your medicines reviewed and adjusted.  Your PCP may decide to repeat certain labs or tests as needed. Do not drive, operate heavy machinery, perform activities at heights, swimming or participation in water  activities or provide baby sitting services if your were admitted for syncope or siezures until you have seen by Primary MD or a Neurologist and advised to do so again. [place]  Controlled Substance Reporting System database was reviewed. Do not drive, operate heavy machinery, perform activities at heights, swim, participate in water  activities or  provide baby-sitting services while on medications for pain, sleep and mood until your outpatient physician has reevaluated you and advised to do so again.  You are strongly recommended to comply with the dose, frequency and duration of prescribed medications. Activity: As tolerated with Full fall precautions use walker/cane & assistance as needed Avoid using any recreational substances like cigarette, tobacco, alcohol, or non-prescribed drug. If you experience worsening of your admission symptoms, develop shortness of breath, life threatening emergency, suicidal or  homicidal thoughts you must seek medical attention immediately by calling 911 or calling your MD immediately  if symptoms less severe. You must read complete instructions/literature along with all the possible adverse reactions/side effects for all the medicines you take and that have been prescribed to you. Take any new medicine only after you have completely understood and accepted all the possible adverse reactions/side effects.  Wear Seat belts while driving. You were cared for by a hospitalist during your hospital stay. If you have any questions about your discharge medications or the care you received while you were in the hospital after you are discharged, you can call the unit and ask to speak with the hospitalist or the covering physician. Once you are discharged, your primary care physician will handle any further medical issues. Please note that NO REFILLS for any discharge medications will be authorized once you are discharged, as it is imperative that you return to your primary care physician (or establish a relationship with a primary care physician if you do not have one).   Increase activity slowly   Complete by: As directed        Discharge Exam:   Vitals:   10/17/24 0600 10/17/24 1356 10/17/24 2201 10/18/24 0549  BP: (!) 128/90 (!) 149/97 (!) 127/90 114/74  Pulse: 68 74 67 (!) 57  Resp: 15 18 18 18   Temp: 98.2 F  (36.8 C) 98.6 F (37 C) 98.7 F (37.1 C) 98.1 F (36.7 C)  TempSrc: Oral Oral Oral Oral  SpO2: 97% 99% 97% 97%    There is no height or weight on file to calculate BMI.  General exam: Pleasant, elderly Caucasian male.  No acute distress Skin: No rashes, lesions or ulcers. HEENT: Atraumatic, normocephalic, no obvious bleeding Lungs: Clear to auscultation bilaterally,  CVS: S1, S2, no murmur,   GI/Abd: Soft, nontender, nondistended, bowel sound present,   CNS: Alert, awake and oriented x 3 Psychiatry: Mood appropriate Extremities: No pedal edema, no calf tenderness,    The results of significant diagnostics from this hospitalization (including imaging, microbiology, ancillary and laboratory) are listed below for reference.    Procedures and Diagnostic Studies:   CT ABDOMEN PELVIS W CONTRAST Result Date: 10/14/2024 CLINICAL DATA:  Abdominal pain. Recent cystoscopy 10/07/2024. History of prostate cancer and cutaneous T-cell lymphoma. EXAM: CT ABDOMEN AND PELVIS WITH CONTRAST TECHNIQUE: Multidetector CT imaging of the abdomen and pelvis was performed using the standard protocol following bolus administration of intravenous contrast. RADIATION DOSE REDUCTION: This exam was performed according to the departmental dose-optimization program which includes automated exposure control, adjustment of the mA and/or kV according to patient size and/or use of iterative reconstruction technique. CONTRAST:  OMNIPAQUE  IOHEXOL  300 MG/ML  SOLN COMPARISON:  Abdomen and pelvis CT 03/26/2024 FINDINGS: Lower chest: No acute findings Hepatobiliary: No suspicious focal abnormality within the liver parenchyma. Gallbladder is surgically absent. No intrahepatic or extrahepatic biliary dilation. Pancreas: No focal mass lesion. No dilatation of the main duct. No intraparenchymal cyst. No peripancreatic edema. Spleen: No splenomegaly. No suspicious focal mass lesion. Adrenals/Urinary Tract: No adrenal nodule or mass.  2 mm nonobstructing stone identified interpolar right kidney. Mild right hydroureteronephrosis evident with ureteral distension down to the level of the UVJ. Left kidney and ureter unremarkable. Circumferential irregular and ill-defined bladder wall thickening noted. A small focal calcification along the inferior bladder wall may be intramural or mucosal based on sagittal image 117/10. Stomach/Bowel: Tiny hiatal hernia. Stomach otherwise unremarkable. Duodenum is normally positioned as is the  ligament of Treitz. 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 No small bowel wall thickening. No small bowel dilatation. No gross colonic mass. No colonic wall thickening. The appendix is normal. No gross colonic mass. No colonic wall thickening. Vascular/Lymphatic: There is mild atherosclerotic calcification of the abdominal aorta without aneurysm. There is no gastrohepatic or hepatoduodenal ligament lymphadenopathy. No retroperitoneal or mesenteric lymphadenopathy. No pelvic sidewall lymphadenopathy. Reproductive: Prostatectomy. Other: No substantial intraperitoneal free fluid. Musculoskeletal: No worrisome lytic or sclerotic osseous abnormality. T12 compression deformity is similar in the interval. IMPRESSION: 1. Circumferential irregular and ill-defined bladder wall thickening suggest infection/inflammation. 2. Mild right hydroureteronephrosis, new in the interval since 03/26/2024. Ureteral distension extends down to the level of the right UVJ. No discrete etiology for the urinary distension is evident by CT imaging. 3. 2 mm nonobstructing stone interpolar right kidney. 4. Tiny hiatal hernia. 5.  Aortic Atherosclerosis (ICD10-I70.0). Electronically Signed   By: Camellia Candle M.D.   On: 10/14/2024 06:22     Labs:   Basic Metabolic Panel: Recent Labs  Lab 10/14/24 0452 10/15/24 0534 10/17/24 1442 10/18/24 0500  NA 134* 137 135 137  K 4.4 4.1 4.2 4.0  CL 100 102 100 102  CO2 24 23 23 22   GLUCOSE 139* 133*  113* 117*  BUN 15 14 13 12   CREATININE 1.05 1.07 1.08 1.02  CALCIUM  9.6 9.6 9.5 9.2  MG  --   --  2.6* 2.7*  PHOS  --   --  2.7 3.4   GFR Estimated Creatinine Clearance: 78.2 mL/min (by C-G formula based on SCr of 1.02 mg/dL). Liver Function Tests: Recent Labs  Lab 10/14/24 0452 10/15/24 0534  AST 19 14*  ALT 11 9  ALKPHOS 69 64  BILITOT 0.3 0.3  PROT 8.1 7.0  ALBUMIN 3.9 3.8   Recent Labs  Lab 10/14/24 0452  LIPASE 14   No results for input(s): AMMONIA in the last 168 hours. Coagulation profile No results for input(s): INR, PROTIME in the last 168 hours.  CBC: Recent Labs  Lab 10/14/24 0452 10/15/24 0534 10/17/24 1442 10/18/24 0500  WBC 6.2 8.6 10.4 8.0  NEUTROABS 4.5  --   --   --   HGB 8.3* 8.2* 9.1* 8.4*  HCT 28.1* 27.7* 31.0* 28.2*  MCV 82.2 81.7 80.7 80.8  PLT 321 346 438* 373   Cardiac Enzymes: No results for input(s): CKTOTAL, CKMB, CKMBINDEX, TROPONINI in the last 168 hours. BNP: Invalid input(s): POCBNP CBG: No results for input(s): GLUCAP in the last 168 hours. D-Dimer No results for input(s): DDIMER in the last 72 hours. Hgb A1c No results for input(s): HGBA1C in the last 72 hours. Lipid Profile No results for input(s): CHOL, HDL, LDLCALC, TRIG, CHOLHDL, LDLDIRECT in the last 72 hours. Thyroid  function studies No results for input(s): TSH, T4TOTAL, T3FREE, THYROIDAB in the last 72 hours.  Invalid input(s): FREET3 Anemia work up Recent Labs    10/18/24 0500  FOLATE 14.4   Microbiology Recent Results (from the past 240 hours)  Urine Culture (for pregnant, neutropenic or urologic patients or patients with an indwelling urinary catheter)     Status: None   Collection Time: 10/17/24  1:59 PM   Specimen: Urine, Clean Catch  Result Value Ref Range Status   Specimen Description   Final    URINE, CLEAN CATCH Performed at [facility], [address], [place], [place] [address]     Special Requests   Final    NONE Performed at  [facility], [address], [place], [place] [address]    Culture   Final    NO GROWTH Performed at [facility], [address], [place], [place] [address]    Report Status 10/18/2024 FINAL  Final    Time coordinating discharge: 45 minutes  Signed: Earlyne Feeser  [facility] 10/18/2024, 11:25 AM  "

## 2024-10-18 NOTE — Consult Note (Signed)
 Reason for Consult:abd pain Referring Physician: Dr. Dahal  Kevin [name] is an 69 y.o. male.  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  He began having lower abdominal pain on Wednesday.  This is a pain he has had in the past.  He denied any nausea or vomiting.  He came to the emergency department where a CT scan was performed that was suggestive of possible ileus.  He denied any fevers or chills.  He has taken some laxatives and is started having bowel movements.  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   ctcl cutaneous t cell lymphoma   Prostate cancer (HCC) 2017   Rosacea     Past Surgical History:  Procedure Laterality Date   CHOLECYSTECTOMY  1994 or1995   CYSTOSCOPY W/ RETROGRADES Left 01/20/2017   Procedure: CYSTOSCOPY WITH RETROGRADE PYELOGRAM, LEFT STENT PLACEMENT(STRING OFF);  Surgeon: Gretel Ferrara, MD;  Location: [facility];  Service: Urology;  Laterality: Left;   CYSTOSCOPY WITH FULGERATION N/A 10/07/2024   Procedure: CYSTOSCOPY, WITH BLADDER FULGURATION;  Surgeon: Alvaro Ricardo KATHEE Mickey., MD;  Location: [facility];  Service: Urology;  Laterality: N/A;   CYSTOSCOPY WITH LITHOLAPAXY N/A 03/19/2022   Procedure: CYSTOSCOPY WITH LITHOLAPAXY;  Surgeon: Matilda Senior, MD;  Location: [facility];  Service: Urology;  Laterality: N/A;   CYSTOSCOPY WITH RETROGRADE PYELOGRAM, URETEROSCOPY AND STENT PLACEMENT Left 01/17/2017   Procedure: CYSTOSCOPY WITH RETROGRADE PYELOGRAM, URETEROSCOPY, HOLMIUM LASER AND  LEFT STENT PLACEMENT;  Surgeon: Senior Matilda, MD;  Location: [facility];  Service: Urology;  Laterality: Left;   DRUG INDUCED ENDOSCOPY Bilateral 11/27/2022   Procedure: DRUG  INDUCED ENDOSCOPY;  Surgeon: Carlie Clark, MD;  Location: [facility];  Service: ENT;  Laterality: Bilateral;   HOLMIUM LASER APPLICATION N/A 03/19/2022   Procedure: HOLMIUM LASER APPLICATION;  Surgeon: Matilda Senior, MD;  Location: [facility];  Service: Urology;  Laterality: N/A;   IMPLANTATION OF HYPOGLOSSAL NERVE STIMULATOR Right 05/15/2023   Procedure: IMPLANTATION OF HYPOGLOSSAL NERVE STIMULATOR;  Surgeon: Carlie Clark, MD;  Location: [facility];  Service: ENT;  Laterality: Right;  RNFA   PENILE PROSTHESIS IMPLANT N/A 06/23/2020   Procedure: PENILE PROTHESIS INFLATABLE;  Surgeon: Matilda Senior, MD;  Location: [facility];  Service: Urology;  Laterality: N/A;  2 HRS   PENILE PROSTHESIS IMPLANT N/A 11/14/2020   Procedure: Excision of prosthesis resevoir, Closure of bladder defect, Suprapubic tube placement;  Surgeon: Matilda Senior, MD;  Location: [facility];  Service: Urology;  Laterality: N/A;   PENILE PROSTHESIS IMPLANT N/A 02/23/2021   Procedure: PENILE PROTHESIS RESERVOIR;  Surgeon: Matilda Senior, MD;  Location: [facility];  Service: Urology;  Laterality: N/A;  75 MINS   PROSTATE SURGERY  2017   radical prostectomy @ [facility]-   receiving chronic interferon      TONSILLECTOMY  as child   adenoids removed   URETERAL STENT PLACEMENT     wisdom  teeth extraction  1990    Family History  Problem Relation Age of Onset   Stroke Father    Hypertension Father    Head & neck cancer Father    Hypertension Mother  Skin cancer Mother    Prostate cancer Paternal Uncle        prostate ca   Colon cancer Maternal Grandmother    Breast cancer Neg Hx     Social History:  reports that he quit smoking about 10 years ago. His smoking use included cigarettes. He started smoking about 20 years ago. He has a 5 pack-year smoking history. He has never used smokeless tobacco. He reports that he does not currently use alcohol. He  reports current drug use. Drug: Marijuana.  Allergies: Allergies[1]  Medications: I have reviewed the patient's current medications.  Results for orders placed or performed during the hospital encounter of 10/14/24 (from the past 48 hours)  Basic metabolic panel with GFR     Status: Abnormal   Collection Time: 10/17/24  2:42 PM  Result Value Ref Range   Sodium 135 135 - 145 mmol/L   Potassium 4.2 3.5 - 5.1 mmol/L   Chloride 100 98 - 111 mmol/L   CO2 23 22 - 32 mmol/L   Glucose, Bld 113 (H) 70 - 99 mg/dL    Comment: Glucose reference range applies only to samples taken after fasting for at least 8 hours.   BUN 13 8 - 23 mg/dL   Creatinine, Ser 8.91 0.61 - 1.24 mg/dL   Calcium  9.5 8.9 - 10.3 mg/dL   GFR, Estimated >39 >39 mL/min    Comment: (NOTE) Calculated using the CKD-EPI Creatinine Equation (2021)    Anion gap 12 5 - 15    Comment: Performed at [facility], [address] [place], [place] [address]  CBC     Status: Abnormal   Collection Time: 10/17/24  2:42 PM  Result Value Ref Range   WBC 10.4 4.0 - 10.5 K/uL   RBC 3.84 (L) 4.22 - 5.81 MIL/uL   Hemoglobin 9.1 (L) 13.0 - 17.0 g/dL   HCT 68.9 (L) 60.9 - 47.9 %   MCV 80.7 80.0 - 100.0 fL   MCH 23.7 (L) 26.0 - 34.0 pg   MCHC 29.4 (L) 30.0 - 36.0 g/dL   RDW 83.9 (H) 88.4 - 84.4 %   Platelets 438 (H) 150 - 400 K/uL   nRBC 0.0 0.0 - 0.2 %    Comment: Performed at [facility], [address], [place], [place] [address]  Magnesium      Status: Abnormal   Collection Time: 10/17/24  2:42 PM  Result Value Ref Range   Magnesium  2.6 (H) 1.7 - 2.4 mg/dL    Comment: Performed at [facility], [address], [place], [place] [address]  Phosphorus     Status: None   Collection Time: 10/17/24  2:42 PM  Result Value Ref Range   Phosphorus 2.7 2.5 - 4.6 mg/dL    Comment: Performed at [facility], [address], [place], [place] [address]  Basic metabolic  panel with GFR     Status: Abnormal   Collection Time: 10/18/24  5:00 AM  Result Value Ref Range   Sodium 137 135 - 145 mmol/L   Potassium 4.0 3.5 - 5.1 mmol/L   Chloride 102 98 - 111 mmol/L   CO2 22 22 - 32 mmol/L   Glucose, Bld 117 (H) 70 - 99 mg/dL    Comment: Glucose reference range applies only to samples taken after fasting for at least 8 hours.   BUN 12 8 - 23 mg/dL   Creatinine, Ser 8.97 0.61 - 1.24 mg/dL  Calcium  9.2 8.9 - 10.3 mg/dL   GFR, Estimated >39 >39 mL/min    Comment: (NOTE) Calculated using the CKD-EPI Creatinine Equation (2021)    Anion gap 13 5 - 15    Comment: Performed at [facility], [address] [place], [place] [address]  CBC     Status: Abnormal   Collection Time: 10/18/24  5:00 AM  Result Value Ref Range   WBC 8.0 4.0 - 10.5 K/uL   RBC 3.49 (L) 4.22 - 5.81 MIL/uL   Hemoglobin 8.4 (L) 13.0 - 17.0 g/dL   HCT 71.7 (L) 60.9 - 47.9 %   MCV 80.8 80.0 - 100.0 fL   MCH 24.1 (L) 26.0 - 34.0 pg   MCHC 29.8 (L) 30.0 - 36.0 g/dL   RDW 84.3 (H) 88.4 - 84.4 %   Platelets 373 150 - 400 K/uL   nRBC 0.0 0.0 - 0.2 %    Comment: Performed at [facility], [address], [place], [place] [address]  Magnesium      Status: Abnormal   Collection Time: 10/18/24  5:00 AM  Result Value Ref Range   Magnesium  2.7 (H) 1.7 - 2.4 mg/dL    Comment: Performed at [facility], [address] [place], [place] [address]  Phosphorus     Status: None   Collection Time: 10/18/24  5:00 AM  Result Value Ref Range   Phosphorus 3.4 2.5 - 4.6 mg/dL    Comment: Performed at [facility], [address] [place], [place] [address]  Folate     Status: None   Collection Time: 10/18/24  5:00 AM  Result Value Ref Range   Folate 14.4 >5.9 ng/mL    Comment: Performed at [facility], [address], [place], [place] [address]    DG Abd 1 View Result Date: 10/17/2024 CLINICAL DATA:  Small  bowel obstruction. EXAM: ABDOMEN - 1 VIEW COMPARISON:  10/16/2024 and CT abdomen pelvis 10/14/2024. FINDINGS: Borderline no unexpected radiopaque calculi. Surgical clips in the right upper quadrant and pelvis. Gaseous distention of small bowel with gas seen in the ascending and proximal transverse colon. Gas and stool in the rectum. IMPRESSION: Persistent mild ileus. Electronically Signed   By: Newell Eke M.D.   On: 10/17/2024 14:04   DG Abd 1 View Result Date: 10/16/2024 CLINICAL DATA:  Constipation. EXAM: ABDOMEN - 1 VIEW COMPARISON:  CT 2 days ago 10/14/2024 FINDINGS: Increased air throughout nondilated small bowel centrally. Air throughout nondilated colon. Small volume of formed colonic stool on the current exam. Multiple surgical clips in the pelvis. Penile prosthesis in place. The right renal stone on recent CT is not seen by radiograph. Cholecystectomy clips. IMPRESSION: Increased air throughout nondilated small bowel and colon, suggesting ileus. Small volume of formed colonic stool on the current exam. Electronically Signed   By: Andrea Gasman M.D.   On: 10/16/2024 16:12    Review of Systems  Constitutional: Negative.   HENT: Negative.    Eyes: Negative.   Respiratory: Negative.    Cardiovascular: Negative.   Gastrointestinal:  Positive for abdominal pain. Negative for nausea and vomiting.  Endocrine: Negative.   Genitourinary:  Positive for difficulty urinating and urgency.  Musculoskeletal: Negative.   Skin: Negative.   Allergic/Immunologic: Negative.   Neurological: Negative.   Hematological: Negative.   Psychiatric/Behavioral: Negative.     Blood pressure 114/74, pulse (!) 57, temperature 98.1 F (36.7 C), temperature source Oral, resp. rate 18, SpO2  97%. Physical Exam Vitals reviewed.  Constitutional:      General: He is not in acute distress.    Appearance: Normal appearance.  HENT:     Head: Normocephalic and atraumatic.     Right Ear: External ear normal.      Left Ear: External ear normal.     Nose: Nose normal.     Mouth/Throat:     Mouth: Mucous membranes are moist.     Pharynx: Oropharynx is clear.  Eyes:     General: No scleral icterus.    Extraocular Movements: Extraocular movements intact.     Conjunctiva/sclera: Conjunctivae normal.     Pupils: Pupils are equal, round, and reactive to light.  Cardiovascular:     Rate and Rhythm: Normal rate and regular rhythm.     Pulses: Normal pulses.     Heart sounds: Normal heart sounds.  Pulmonary:     Effort: Pulmonary effort is normal.     Breath sounds: Normal breath sounds.  Abdominal:     General: Abdomen is flat. Bowel sounds are normal.     Palpations: Abdomen is soft.     Comments: There is mild suprapubic tenderness  Musculoskeletal:        General: No swelling or deformity. Normal range of motion.     Cervical back: Normal range of motion and neck supple.  Skin:    General: Skin is warm and dry.     Coloration: Skin is not jaundiced.  Neurological:     General: No focal deficit present.     Mental Status: He is alert and oriented to person, place, and time.  Psychiatric:        Mood and Affect: Mood normal.        Behavior: Behavior normal.     Assessment/Plan: The patient may have presented with a very mild ileus likely secondary to what was going on with his bladder.  I suspect that as his bladder symptoms resolve that the ileus will improve.  He will not need any intervention from a general surgery standpoint.  We will sign off  Deward Null III 10/18/2024, 7:25 AM         [1]  Allergies Allergen Reactions   Zestril [Lisinopril] Other (See Comments) and Cough    ED, also

## 2024-10-19 ENCOUNTER — Other Ambulatory Visit (HOSPITAL_COMMUNITY): Payer: Self-pay

## 2024-10-19 ENCOUNTER — Other Ambulatory Visit: Payer: Self-pay | Admitting: Internal Medicine

## 2024-10-19 ENCOUNTER — Other Ambulatory Visit: Payer: Self-pay

## 2024-10-19 MED ORDER — MIRABEGRON ER 25 MG PO TB24: 25.0000 mg | ORAL_TABLET | Freq: Every day | ORAL | 0 refills | Status: AC

## 2024-10-21 ENCOUNTER — Ambulatory Visit (HOSPITAL_COMMUNITY): Admission: RE | Admit: 2024-10-21 | Discharge: 2024-10-07 | Disposition: A | Attending: Urology | Admitting: Urology

## 2024-10-26 ENCOUNTER — Telehealth: Payer: Self-pay | Admitting: *Deleted

## 2024-10-26 NOTE — Telephone Encounter (Signed)
 Received call from pt requesting appt with MD to further discuss continuation of anticoagulation therapy as well as option with [facility] for filter placement.  Pt states he also wishes to discuss extended travel with MD.  F/u appt scheduled, pt educated and verbalized understanding.

## 2024-11-03 ENCOUNTER — Inpatient Hospital Stay: Attending: Hematology and Oncology | Admitting: Hematology and Oncology

## 2024-11-03 DIAGNOSIS — I82401 Acute embolism and thrombosis of unspecified deep veins of right lower extremity: Secondary | ICD-10-CM | POA: Diagnosis not present

## 2024-11-03 NOTE — Assessment & Plan Note (Signed)
 2019: DVT (after a plane ride) 11/13/2021: Acute occlusive DVT of the right femoral and popliteal veins  Hypercoagulability work-up 1.  Protein C: 44% (slightly deficient) 2. protein S and Antithrombin: Normal 3.  Lupus anticoagulant/antiphospholipid antibodies: Positive for lupus anticoagulant (repeat testing 06/09/2024: Still positive) Repeat testing on 06/23/2024: No lupus anticoagulant detected (this was done after holding Xarelto) 4.  Prothrombin gene mutation: Not detected 5.  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

## 2024-11-03 NOTE — Progress Notes (Signed)
 HEMATOLOGY-ONCOLOGY TELEPHONE VISIT PROGRESS NOTE  I connected with our patient on 11/03/24 at 10:15 AM EST by telephone and verified that I am speaking with the correct person using two identifiers.  I discussed the limitations, risks, security and privacy concerns of performing an evaluation and management service by telephone and the availability of in person appointments.  I also discussed with the patient that there may be a patient responsible charge related to this service. The patient expressed understanding and agreed to proceed.   History of Present Illness:    History of Present Illness Kevin [name] is a 69 year old male with deep vein thrombosis on anticoagulation who presents for management of anticoagulation in the setting of post-procedural bladder bleeding.  He underwent bladder fulguration on December 31 and developed severe bladder spasms lasting up to an hour, urinary incontinence, abdominal and flank pain, and constipation without bowel movement for 8 to 9 days. This led to acute distress and hospitalization from January 7 to January 11.  During hospitalization, he was treated for urinary tract infection and constipation. Bladder spasms resolved within a few days. He currently has no hematuria or urinary infection symptoms.  He remains on anticoagulation for deep vein thrombosis. Its use has been complicated by bladder bleeding, and he and his urologist are concerned about risk of recurrent bleeding and possible loss of bladder function with continued anticoagulation.  He plans extended travel at the end of February, including long flights to [place] and [place], and requests guidance on safety of travel and anticoagulation management during this period.    REVIEW OF SYSTEMS:   Constitutional: Denies fevers, chills or abnormal weight loss All other systems were reviewed with the patient and are negative. Observations/Objective:     Assessment Plan:  Acute embolism  and thrombosis of unspecified deep veins of unspecified lower extremity (HCC) 2019: DVT (after a plane ride) 11/13/2021: Acute occlusive DVT of the right femoral and popliteal veins  Hypercoagulability work-up 1.  Protein C: 44% (slightly deficient) 2. protein S and Antithrombin: Normal 3.  Lupus anticoagulant/antiphospholipid antibodies: Positive for lupus anticoagulant (repeat testing 06/09/2024: Still positive) Repeat testing on 06/23/2024: No lupus anticoagulant detected (this was done after holding Xarelto) 4.  Prothrombin gene mutation: Not detected 5.  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 Alvaro to see how long we need to hold anticoagulation.  If it is for a short duration then we can discuss placing an IVC filter for a brief period. If the patient cannot take anticoagulation, I will request to see if a low-dose anticoagulation may be another option with an IVC filter  I would also like to repeat another ultrasound first week of February. Assessment & Plan Deep vein thrombosis with consideration for IVC filter placement Ongoing DVT with elevated bleeding risk due to urologic pathology. IVC filter considered as temporary measure due to contraindication to anticoagulation, especially with planned travel. - Discussed IVC filter as temporary intervention to reduce pulmonary embolism risk if anticoagulation contraindicated. - Explained limitations of IVC filter, including lack of protection against new lower extremity thrombus formation and its non-permanent nature. - Advised IVC filter placement could be performed prior to planned travel if indicated. - Recommended compression stockings and frequent ambulation during travel to mitigate DVT risk. - Planned to contact urologist (Dr. Alvaro) to  coordinate timing  and appropriateness of IVC filter placement. - Agreed to follow up after discussion with urology.  Bladder disorder with post-procedural bleeding and spasms Recent bladder fulguration complicated by spasms and bleeding, now improved. Resumption of anticoagulation poses risk for recurrent hemorrhage. - Reviewed improvement in bladder symptoms and cessation of bleeding. - Discussed risk of recurrent hemorrhage with resumption of anticoagulation and potential impact on bladder function. - Planned to coordinate with urology regarding prognosis and management in context of anticoagulation needs.  Urinary tract infection, treated Recent UTI post-bladder procedure, treated with antibiotics. No current symptoms. - No acute intervention required; infection resolved.      I discussed the assessment and treatment plan with the patient. The patient was provided an opportunity to ask questions and all were answered. The patient agreed with the plan and demonstrated an understanding of the instructions. The patient was advised to call back or seek an in-person evaluation if the symptoms worsen or if the condition fails to improve as anticipated.   I provided 20 minutes of non-face-to-face time during this encounter.  This includes time for charting and coordination of care   Naomi MARLA Chad, MD

## 2025-01-04 ENCOUNTER — Encounter (HOSPITAL_COMMUNITY)

## 2025-01-06 ENCOUNTER — Telehealth: Admitting: Hematology and Oncology
